# Patient Record
Sex: Female | Born: 1995 | Race: White | Hispanic: No | Marital: Married | Smoking: Never smoker
Health system: Southern US, Community
[De-identification: ages and names within clinical notes are randomized; demographics above are authoritative.]

## PROBLEM LIST (undated history)

## (undated) ENCOUNTER — Inpatient Hospital Stay (HOSPITAL_COMMUNITY): Payer: Self-pay

## (undated) DIAGNOSIS — F419 Anxiety disorder, unspecified: Secondary | ICD-10-CM

## (undated) DIAGNOSIS — N809 Endometriosis, unspecified: Secondary | ICD-10-CM

## (undated) DIAGNOSIS — IMO0001 Reserved for inherently not codable concepts without codable children: Secondary | ICD-10-CM

## (undated) DIAGNOSIS — Z789 Other specified health status: Secondary | ICD-10-CM

## (undated) DIAGNOSIS — D649 Anemia, unspecified: Secondary | ICD-10-CM

## (undated) DIAGNOSIS — F909 Attention-deficit hyperactivity disorder, unspecified type: Secondary | ICD-10-CM

## (undated) DIAGNOSIS — R519 Headache, unspecified: Secondary | ICD-10-CM

## (undated) HISTORY — DX: Anemia, unspecified: D64.9

## (undated) HISTORY — DX: Endometriosis, unspecified: N80.9

## (undated) HISTORY — PX: ENDOMETRIAL ABLATION: SHX621

## (undated) HISTORY — DX: Anxiety disorder, unspecified: F41.9

## (undated) HISTORY — PX: NO PAST SURGERIES: SHX2092

---
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
## 2015-08-14 LAB — OB RESULTS CONSOLE RUBELLA ANTIBODY, IGM: Rubella: IMMUNE

## 2015-08-14 LAB — OB RESULTS CONSOLE RPR: RPR: NONREACTIVE

## 2015-08-14 LAB — OB RESULTS CONSOLE HIV ANTIBODY (ROUTINE TESTING): HIV: NONREACTIVE

## 2015-08-14 LAB — OB RESULTS CONSOLE HEPATITIS B SURFACE ANTIGEN: Hepatitis B Surface Ag: NEGATIVE

## 2015-08-14 LAB — OB RESULTS CONSOLE GC/CHLAMYDIA
Chlamydia: NEGATIVE
GC PROBE AMP, GENITAL: NEGATIVE

## 2015-09-27 HISTORY — PX: WISDOM TOOTH EXTRACTION: SHX21

## 2015-09-27 NOTE — L&D Delivery Note (Addendum)
Final Labor Progress Note At 1020 pt reports an increased in rectal pressure.  VE C/C/+1.  FHR remained reassuring with occasional early and variable decelerations.  Vaginal Delivery Note The pt utilized an epidural as pain management.   Artificial rupture of membranes today, at 0255, clear.  GBS was negative.  Cervical dilation was complete at  1020.     Pushing with guidance began at  1114.   After 1 hour(s) and  32 minutes of pushing the head, shoulders and the body of a viable female infant " Veronica Cooke " delivered spontaneously with maternal effort in the ROA position at 1246.  With vigorous tone and spontaneous cry, the infant was placed on moms abd. After the umbilical cord was clamped it was cut by the FOB, then cord blood was obtained for evaluation.  Spontaneous delivery of a intact placenta with a 3 vessel cord via [place] at 1252 .   Episiotomy: None   The vulva, perineum, vaginal vault, rectum and cervix were inspectedand revealed a 2nd degree perineal, repaired using a 3-0 vicryl on a CT needle,  a superficial left labial laceration and a right right periurethral which was repaired using a 3-0 vicryl on a SH eedle with 5cc of 1% lidocaine.  The rectum sphincter intact after the repair.   Patient tolerated repair well.   Postpartum pitocin as ordered.  After delivery Fundus firm, lochia minimum, bleeding under control. However, after vaginal repair the uterus was noted to minimally boggy  And slow to firm up despite fundal massage. 1000 mcg of Cytotec given PR.   EBL 250, Pt hemodynamically stable.   Sponge, laps and needle count correct and verified with the primary care nurse.  Attending MD available at all times.    Routine postpartum orders   Mother unsure about method of contraception  Mom plans to breastfeed. Infant to have out patient circumcision   Placenta to pathology: YES     Cord Gases sent to lab: NO Cord blood sent to lab: YES   APGARS: 8 at 1 minute and 9  at 5 minutes Weight:.    Both mom and baby were left in stable condition, baby skin to skin.    Veronica Cooke, CNM, MSN 02/13/2016. 1:57 PM

## 2015-11-02 ENCOUNTER — Encounter (HOSPITAL_COMMUNITY): Payer: Self-pay | Admitting: *Deleted

## 2015-11-02 ENCOUNTER — Inpatient Hospital Stay (HOSPITAL_COMMUNITY)
Admission: AD | Admit: 2015-11-02 | Discharge: 2015-11-02 | Disposition: A | Discharge status: Home / Self Care | Payer: Medicaid Other | Source: Ambulatory Visit | Attending: Obstetrics & Gynecology | Admitting: Obstetrics & Gynecology

## 2015-11-02 DIAGNOSIS — R112 Nausea with vomiting, unspecified: Secondary | ICD-10-CM | POA: Insufficient documentation

## 2015-11-02 DIAGNOSIS — E860 Dehydration: Secondary | ICD-10-CM | POA: Diagnosis not present

## 2015-11-02 DIAGNOSIS — O26892 Other specified pregnancy related conditions, second trimester: Secondary | ICD-10-CM | POA: Diagnosis not present

## 2015-11-02 DIAGNOSIS — Z3A24 24 weeks gestation of pregnancy: Secondary | ICD-10-CM | POA: Insufficient documentation

## 2015-11-02 HISTORY — DX: Other specified health status: Z78.9

## 2015-11-02 LAB — URINALYSIS, ROUTINE W REFLEX MICROSCOPIC
GLUCOSE, UA: NEGATIVE mg/dL
KETONES UR: 15 mg/dL — AB
NITRITE: NEGATIVE
Protein, ur: 30 mg/dL — AB
Specific Gravity, Urine: >1.03 — ABNORMAL HIGH (ref 1.005–1.030)
pH: 6 (ref 5.0–8.0)

## 2015-11-02 LAB — COMPREHENSIVE METABOLIC PANEL
ALT: 17 U/L (ref 14–54)
AST: 23 U/L (ref 15–41)
Albumin: 3.7 g/dL (ref 3.5–5.0)
Alkaline Phosphatase: 65 U/L (ref 38–126)
Anion gap: 9 (ref 5–15)
BUN: 11 mg/dL (ref 6–20)
CO2: 23 mmol/L (ref 22–32)
Calcium: 8.8 mg/dL — ABNORMAL LOW (ref 8.9–10.3)
Chloride: 104 mmol/L (ref 101–111)
Creatinine, Ser: 0.45 mg/dL (ref 0.44–1.00)
Glucose, Bld: 99 mg/dL (ref 65–99)
Potassium: 3.9 mmol/L (ref 3.5–5.1)
SODIUM: 136 mmol/L (ref 135–145)
Total Bilirubin: 0.8 mg/dL (ref 0.3–1.2)
Total Protein: 7.3 g/dL (ref 6.5–8.1)

## 2015-11-02 LAB — URINE MICROSCOPIC-ADD ON

## 2015-11-02 LAB — CBC
HEMATOCRIT: 36 % (ref 36.0–46.0)
Hemoglobin: 12.1 g/dL (ref 12.0–15.0)
MCH: 28.2 pg (ref 26.0–34.0)
MCHC: 33.6 g/dL (ref 30.0–36.0)
MCV: 83.9 fL (ref 78.0–100.0)
Platelets: 306 10*3/uL (ref 150–400)
RBC: 4.29 MIL/uL (ref 3.87–5.11)
RDW: 13.9 % (ref 11.5–15.5)
WBC: 11.4 10*3/uL — AB (ref 4.0–10.5)

## 2015-11-02 MED ORDER — SODIUM CHLORIDE 0.9 % IV SOLN
8.0000 mg | Freq: Once | INTRAVENOUS | Status: AC
  Administered 2015-11-02: 8 mg via INTRAVENOUS
  Filled 2015-11-02: qty 4

## 2015-11-02 MED ORDER — DEXTROSE IN LACTATED RINGERS 5 % IV SOLN
INTRAVENOUS | Status: AC
  Administered 2015-11-02: 16:00:00 via INTRAVENOUS

## 2015-11-02 MED ORDER — ONDANSETRON 4 MG PO TBDP: 4.0000 mg | ORAL_TABLET | Freq: Three times a day (TID) | ORAL | Status: DC | PRN

## 2015-11-02 NOTE — MAU Provider Note (Signed)
History    Veronica Cooke, 19yo, G1P0, 24.3 wks  Presents to the MAU announced for nausea and vomiting.  Reports not keepin anything down for at least 3 days but is unsure of when the last time she was able to keep anything down. Pt reports struggling with N/V throughout entire pregnancy.  Also reports decreased fetal movement. Denies pain,vaginal bleeding.      Patient Active Problem List   Diagnosis Date Noted  . Need for prophylactic vaccination and inoculation against influenza 07/30/2013  . Family history of breast cancer 07/30/2013  . Cellulitis 07/30/2013    Chief Complaint  Patient presents with  . Hyperemesis Gravidarum   HPI  OB History    Gravida Para Term Preterm AB TAB SAB Ectopic Multiple Living   1               Past Medical History  Diagnosis Date  . Medical history non-contributory     Past Surgical History  Procedure Laterality Date  . No past surgeries      Family History  Problem Relation Age of Onset  . Diabetes Maternal Aunt   . Cancer Maternal Grandmother     Breast/ Ovarian  . Stroke Maternal Grandfather   . Diabetes Maternal Grandfather     Social History  Substance Use Topics  . Smoking status: Never Smoker   . Smokeless tobacco: Never Used  . Alcohol Use: No    Allergies: No Known Allergies  Prescriptions prior to admission  Medication Sig Dispense Refill Last Dose  . metoCLOPramide (REGLAN) 10 MG tablet Take 10 mg by mouth every 6 (six) hours as needed for nausea.   11/02/2015 at Unknown time    ROS Physical Exam   Blood pressure 111/64, pulse 88, temperature 97.8 F (36.6 C), resp. rate 16, height  (1.626 m), weight 61.508 kg (135 lb 9.6 oz), SpO2 99 %.  Results for orders placed or performed during the hospital encounter of 11/02/15 (from the past 24 hour(s))  Comprehensive metabolic panel     Status: Abnormal   Collection Time: 11/02/15  3:12 PM  Result Value Ref Range   Sodium 136 135 - 145 mmol/L   Potassium  3.9 3.5 - 5.1 mmol/L   Chloride 104 101 - 111 mmol/L   CO2 23 22 - 32 mmol/L   Glucose, Bld 99 65 - 99 mg/dL   BUN 11 6 - 20 mg/dL   Creatinine, Ser 1.61 0.44 - 1.00 mg/dL   Calcium 8.8 (L) 8.9 - 10.3 mg/dL   Total Protein 7.3 6.5 - 8.1 g/dL   Albumin 3.7 3.5 - 5.0 g/dL   AST 23 15 - 41 U/L   ALT 17 14 - 54 U/L   Alkaline Phosphatase 65 38 - 126 U/L   Total Bilirubin 0.8 0.3 - 1.2 mg/dL   GFR calc non Af Amer >60 >60 mL/min   GFR calc Af Amer >60 >60 mL/min   Anion gap 9 5 - 15  CBC     Status: Abnormal   Collection Time: 11/02/15  3:12 PM  Result Value Ref Range   WBC 11.4 (H) 4.0 - 10.5 K/uL   RBC 4.29 3.87 - 5.11 MIL/uL   Hemoglobin 12.1 12.0 - 15.0 g/dL   HCT 09.6 04.5 - 40.9 %   MCV 83.9 78.0 - 100.0 fL   MCH 28.2 26.0 - 34.0 pg   MCHC 33.6 30.0 - 36.0 g/dL   RDW 81.1 91.4 - 78.2 %  Platelets 306 150 - 400 K/uL  Urinalysis, Routine w reflex microscopic (not at [facility])     Status: Abnormal   Collection Time: 11/02/15  3:30 PM  Result Value Ref Range   Color, Urine AMBER (A) YELLOW   APPearance CLOUDY (A) CLEAR   Specific Gravity, Urine >1.030 (H) 1.005 - 1.030   pH 6.0 5.0 - 8.0   Glucose, UA NEGATIVE NEGATIVE mg/dL   Hgb urine dipstick TRACE (A) NEGATIVE   Bilirubin Urine SMALL (A) NEGATIVE   Ketones, ur 15 (A) NEGATIVE mg/dL   Protein, ur 30 (A) NEGATIVE mg/dL   Nitrite NEGATIVE NEGATIVE   Leukocytes, UA MODERATE (A) NEGATIVE  Urine microscopic-add on     Status: Abnormal   Collection Time: 11/02/15  3:30 PM  Result Value Ref Range   Squamous Epithelial / LPF 6-30 (A) NONE SEEN   WBC, UA 6-30 0 - 5 WBC/hpf   RBC / HPF 0-5 0 - 5 RBC/hpf   Bacteria, UA MANY (A) NONE SEEN   Urine-Other MUCOUS PRESENT    SVE: deferred FHT:   Reassuring, BL 150 bpm, appropriate for [redacted] wk ega UC: None  Physical Exam  Constitutional: She is oriented to person, place, and time. She appears well-developed and well-nourished.  HENT:  Head: Normocephalic.  Eyes: Pupils are equal,  round, and reactive to light.  Neck: Normal range of motion.  Cardiovascular: Normal rate and regular rhythm.   Respiratory: Effort normal.  GI: Soft.  Musculoskeletal: Normal range of motion.  Neurological: She is alert and oriented to person, place, and time. She has normal reflexes.  Skin: Skin is warm and dry.  Psychiatric: She has a normal mood and affect. Her behavior is normal. Judgment and thought content normal.    ED Course  Assessment: IUP 24.3 wks N/V of pregnancy Dehydration, 15 ketones FHT reassuring No Contractions  Plan: DC home IV Fluids IV Zofran  RX Zofran  OTC  Keep scheduled appointment at [facility] Call PRN   Alphonzo Severance CNM, MSN 11/02/2015 6:03 PM

## 2015-11-02 NOTE — MAU Note (Signed)
Patient states she has had n/v throughout pregnancy and it has gotten worse.  She is unsure of when the last time she was able to keep anything down.  No c/o pain, no vaginal bleeding. Reports  Decreased fetal movement.

## 2015-11-02 NOTE — Discharge Instructions (Signed)
Hyperemesis Gravidarum °Hyperemesis gravidarum is a severe form of nausea and vomiting that happens during pregnancy. Hyperemesis is worse than morning sickness. It may cause you to have nausea or vomiting all day for many days. It may keep you from eating and drinking enough food and liquids. Hyperemesis usually occurs during the first half (the first 20 weeks) of pregnancy. It often goes away once a woman is in her second half of pregnancy. However, sometimes hyperemesis continues through an entire pregnancy.  °CAUSES  °The cause of this condition is not completely known but is thought to be related to changes in the body's hormones when pregnant. It could be from the high level of the pregnancy hormone or an increase in estrogen in the body.  °SIGNS AND SYMPTOMS  °· Severe nausea and vomiting. °· Nausea that does not go away. °· Vomiting that does not allow you to keep any food down. °· Weight loss and body fluid loss (dehydration). °· Having no desire to eat or not liking food you have previously enjoyed. °DIAGNOSIS  °Your health care provider will do a physical exam and ask you about your symptoms. He or she may also order blood tests and urine tests to make sure something else is not causing the problem.  °TREATMENT  °You may only need medicine to control the problem. If medicines do not control the nausea and vomiting, you will be treated in the hospital to prevent dehydration, increased acid in the blood (acidosis), weight loss, and changes in the electrolytes in your body that may harm the unborn baby (fetus). You may need IV fluids.  °HOME CARE INSTRUCTIONS  °· Only take over-the-counter or prescription medicines as directed by your health care provider. °· Try eating a couple of dry crackers or toast in the morning before getting out of bed. °· Avoid foods and smells that upset your stomach. °· Avoid fatty and spicy foods. °· Eat 5-6 small meals a day. °· Do not drink when eating meals. Drink between  meals. °· For snacks, eat high-protein foods, such as cheese. °· Eat or suck on things that have ginger in them. Ginger helps nausea. °· Avoid food preparation. The smell of food can spoil your appetite. °· Avoid iron pills and iron in your multivitamins until after 3-4 months of being pregnant. However, consult with your health care provider before stopping any prescribed iron pills. °SEEK MEDICAL CARE IF:  °· Your abdominal pain increases. °· You have a severe headache. °· You have vision problems. °· You are losing weight. °SEEK IMMEDIATE MEDICAL CARE IF:  °· You are unable to keep fluids down. °· You vomit blood. °· You have constant nausea and vomiting. °· You have excessive weakness. °· You have extreme thirst. °· You have dizziness or fainting. °· You have a fever or persistent symptoms for more than 2-3 days. °· You have a fever and your symptoms suddenly get worse. °MAKE SURE YOU:  °· Understand these instructions. °· Will watch your condition. °· Will get help right away if you are not doing well or get worse. °  °This information is not intended to replace advice given to you by your health care provider. Make sure you discuss any questions you have with your health care provider. °  °Document Released: 09/12/2005 Document Revised: 07/03/2013 Document Reviewed: 04/24/2013 °Elsevier Interactive Patient Education ©2016 [employer] ° °Eating Plan for Hyperemesis Gravidarum °Severe cases of hyperemesis gravidarum can lead to dehydration and malnutrition. The hyperemesis eating plan is   one way to lessen the symptoms of nausea and vomiting. It is often used with prescribed medicines to control your symptoms.  °WHAT CAN I DO TO RELIEVE MY SYMPTOMS? °Listen to your body. Everyone is different and has different preferences. Find what works best for you. Some of the following things may help: °· Eat and drink slowly. °· Eat 5-6 small meals daily instead of 3 large meals.   °· Eat crackers before you get out of  bed in the morning.   °· Starchy foods are usually well tolerated (such as cereal, toast, bread, potatoes, pasta, rice, and pretzels).   °· Ginger may help with nausea. Add ¼ tsp ground ginger to hot tea or choose ginger tea.   °· Try drinking 100% fruit juice or an electrolyte drink. °· Continue to take your prenatal vitamins as directed by your health care provider. If you are having trouble taking your prenatal vitamins, talk with your health care provider about different options. °· Include at least 1 serving of protein with your meals and snacks (such as meats or poultry, beans, nuts, eggs, or yogurt). Try eating a protein-rich snack before bed (such as cheese and crackers or a half turkey or peanut butter sandwich). °WHAT THINGS SHOULD I AVOID TO REDUCE MY SYMPTOMS? °The following things may help reduce your symptoms: °· Avoid foods with strong smells. Try eating meals in well-ventilated areas that are free of odors. °· Avoid drinking water or other beverages with meals. Try not to drink anything less than 30 minutes before and after meals. °· Avoid drinking more than 1 cup of fluid at a time. °· Avoid fried or high-fat foods, such as butter and cream sauces. °· Avoid spicy foods. °· Avoid skipping meals the best you can. Nausea can be more intense on an empty stomach. If you cannot tolerate food at that time, do not force it. Try sucking on ice chips or other frozen items and make up the calories later. °· Avoid lying down within 2 hours after eating. °  °This information is not intended to replace advice given to you by your health care provider. Make sure you discuss any questions you have with your health care provider. °  °Document Released: 07/10/2007 Document Revised: 09/17/2013 Document Reviewed: 07/17/2013 °Elsevier Interactive Patient Education ©2016 Elsevier Inc. ° ° °

## 2015-11-28 ENCOUNTER — Encounter (HOSPITAL_COMMUNITY): Payer: Self-pay | Admitting: *Deleted

## 2015-11-28 ENCOUNTER — Inpatient Hospital Stay (HOSPITAL_COMMUNITY)
Admission: AD | Admit: 2015-11-28 | Discharge: 2015-11-28 | Disposition: A | Discharge status: Home / Self Care | Payer: Medicaid Other | Source: Ambulatory Visit | Attending: Obstetrics and Gynecology | Admitting: Obstetrics and Gynecology

## 2015-11-28 ENCOUNTER — Inpatient Hospital Stay (HOSPITAL_COMMUNITY): Payer: Medicaid Other

## 2015-11-28 DIAGNOSIS — O26893 Other specified pregnancy related conditions, third trimester: Secondary | ICD-10-CM | POA: Diagnosis not present

## 2015-11-28 DIAGNOSIS — R109 Unspecified abdominal pain: Secondary | ICD-10-CM | POA: Diagnosis present

## 2015-11-28 DIAGNOSIS — Z3A28 28 weeks gestation of pregnancy: Secondary | ICD-10-CM | POA: Diagnosis not present

## 2015-11-28 DIAGNOSIS — O26899 Other specified pregnancy related conditions, unspecified trimester: Secondary | ICD-10-CM

## 2015-11-28 DIAGNOSIS — O9A212 Injury, poisoning and certain other consequences of external causes complicating pregnancy, second trimester: Secondary | ICD-10-CM

## 2015-11-28 LAB — CBC
HEMATOCRIT: 33.9 % — AB (ref 36.0–46.0)
HEMOGLOBIN: 11.4 g/dL — AB (ref 12.0–15.0)
MCH: 27.1 pg (ref 26.0–34.0)
MCHC: 33.6 g/dL (ref 30.0–36.0)
MCV: 80.7 fL (ref 78.0–100.0)
Platelets: 325 10*3/uL (ref 150–400)
RBC: 4.2 MIL/uL (ref 3.87–5.11)
RDW: 13.5 % (ref 11.5–15.5)
WBC: 11.9 10*3/uL — AB (ref 4.0–10.5)

## 2015-11-28 LAB — KLEIHAUER-BETKE STAIN
# VIALS RHIG: 1
Fetal Cells %: 0 %
Quantitation Fetal Hemoglobin: 0 mL

## 2015-11-28 LAB — URINE MICROSCOPIC-ADD ON: RBC / HPF: NONE SEEN RBC/hpf (ref 0–5)

## 2015-11-28 LAB — WET PREP, GENITAL
Clue Cells Wet Prep HPF POC: NONE SEEN
SPERM: NONE SEEN
TRICH WET PREP: NONE SEEN
Yeast Wet Prep HPF POC: NONE SEEN

## 2015-11-28 LAB — URINALYSIS, ROUTINE W REFLEX MICROSCOPIC
Bilirubin Urine: NEGATIVE
GLUCOSE, UA: NEGATIVE mg/dL
Hgb urine dipstick: NEGATIVE
Ketones, ur: NEGATIVE mg/dL
Nitrite: NEGATIVE
PH: 5.5 (ref 5.0–8.0)
Protein, ur: NEGATIVE mg/dL
Specific Gravity, Urine: >1.03 — ABNORMAL HIGH (ref 1.005–1.030)

## 2015-11-28 LAB — FETAL FIBRONECTIN: FETAL FIBRONECTIN: NEGATIVE

## 2015-11-28 LAB — TYPE AND SCREEN
ABO/RH(D): O POS
ANTIBODY SCREEN: NEGATIVE

## 2015-11-28 LAB — ABO/RH: ABO/RH(D): O POS

## 2015-11-28 IMAGING — US US MFM OB LIMITED
1 series · 15 of 16 positions shown · non-contrast
Comparison: none

[Series 1: us mfm ob limited · 16 acquisitions, 15 frames shown]
[im 1/16]
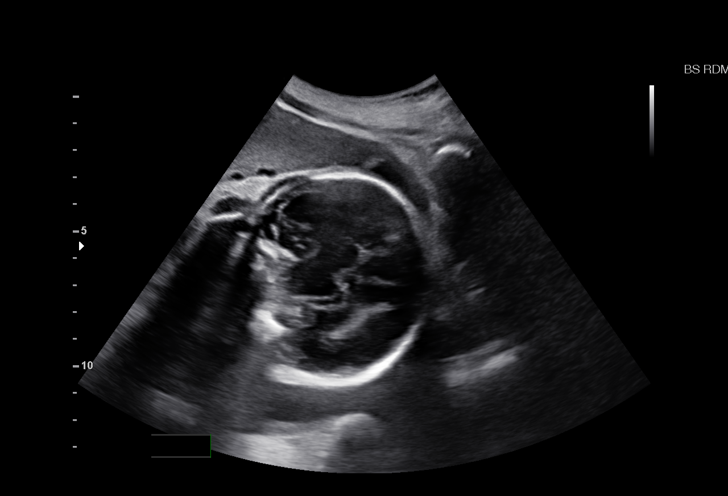
[im 2/16]
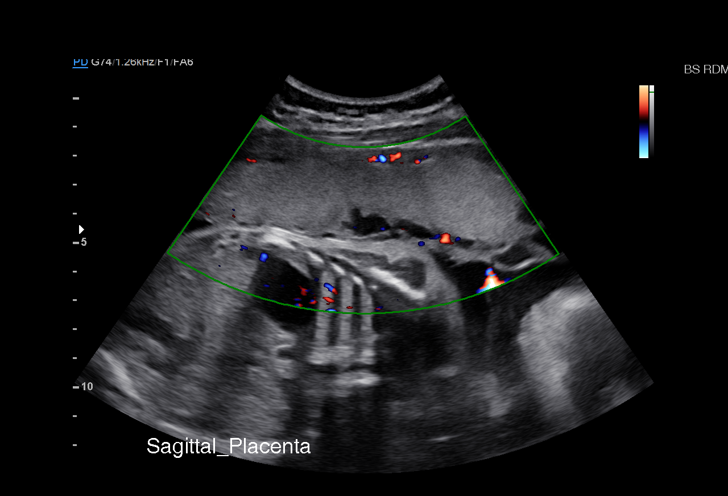
[im 3/16]
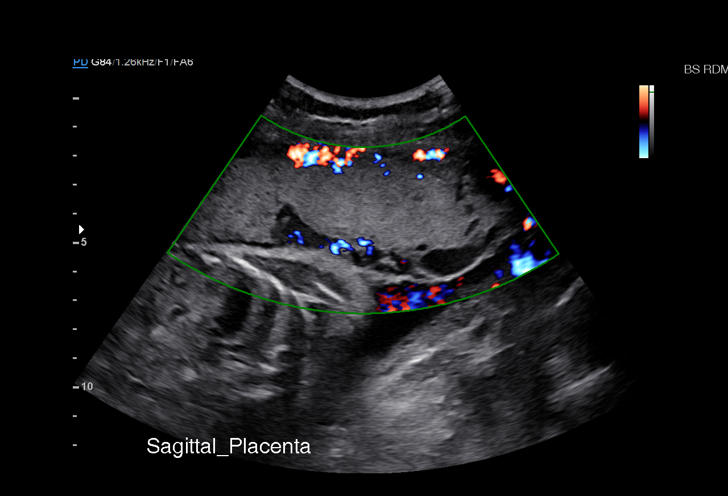
[im 4/16]
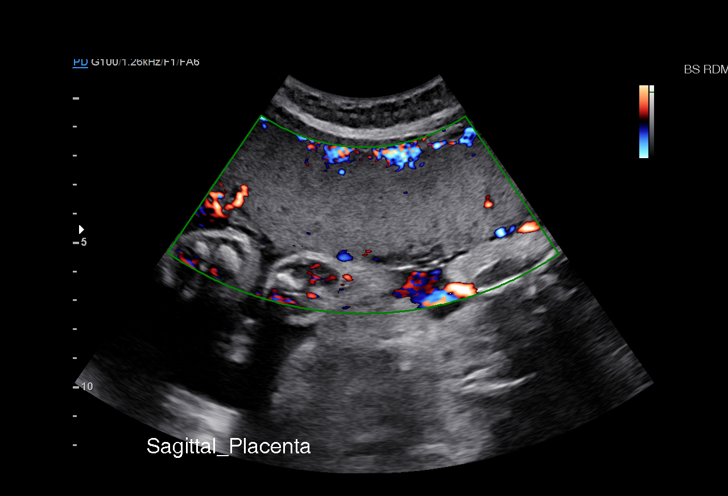
[im 5/16]
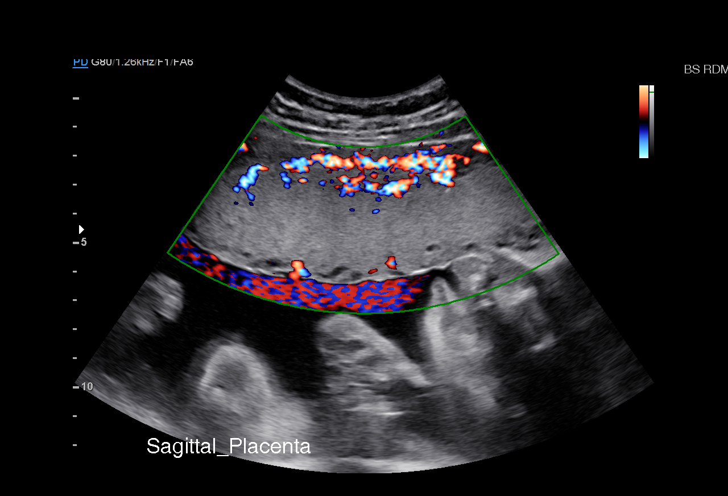
[im 6/16]
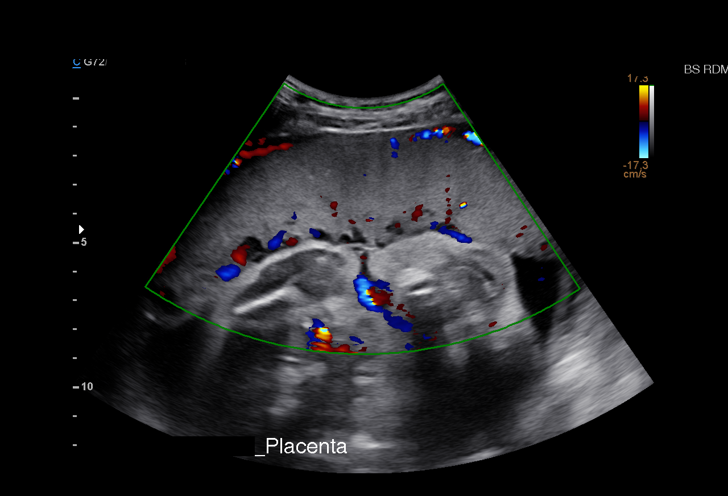
[im 7/16]
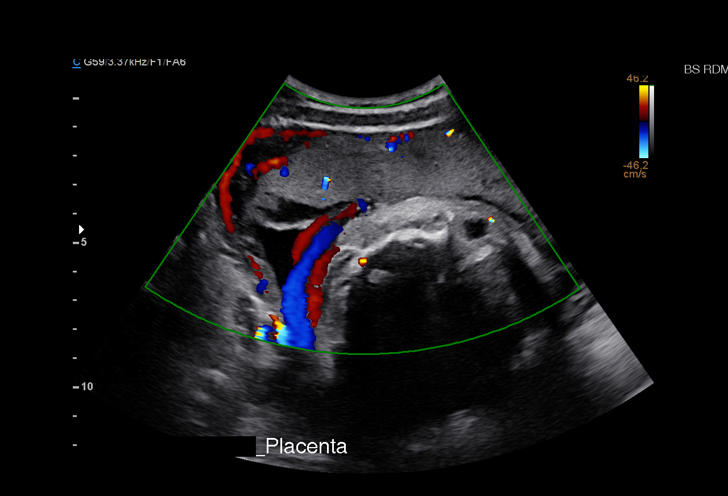
[im 9/16]
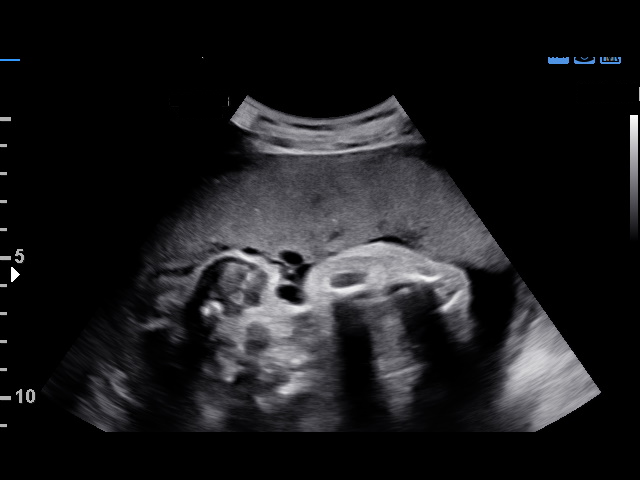
[im 10/16]
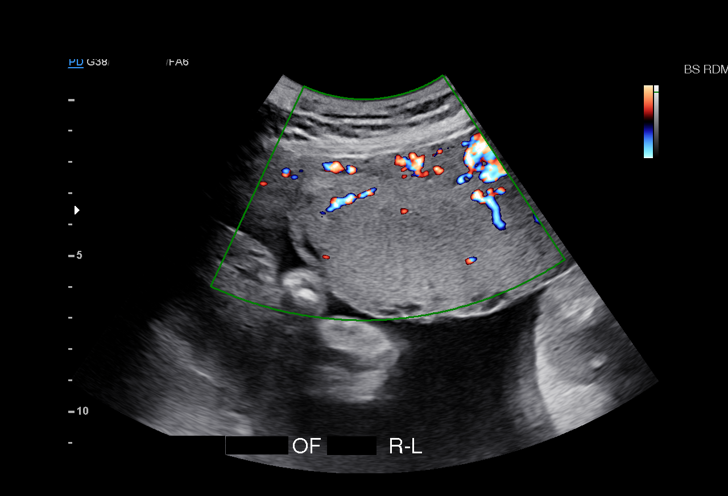
[im 11/16]
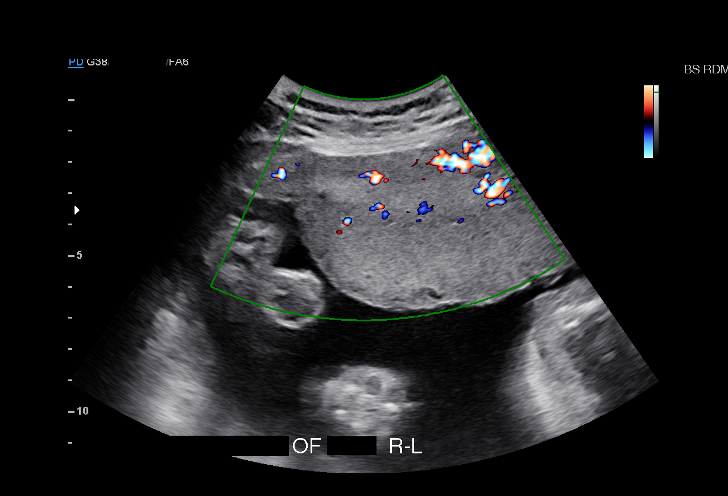
[im 12/16]
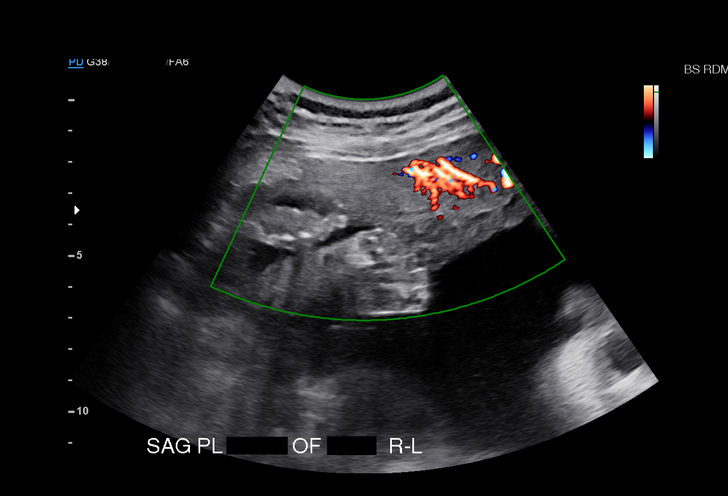
[im 13/16]
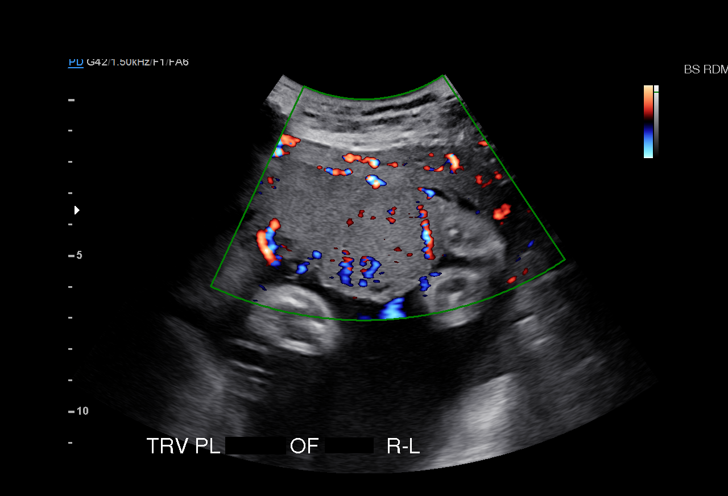
[im 14/16]
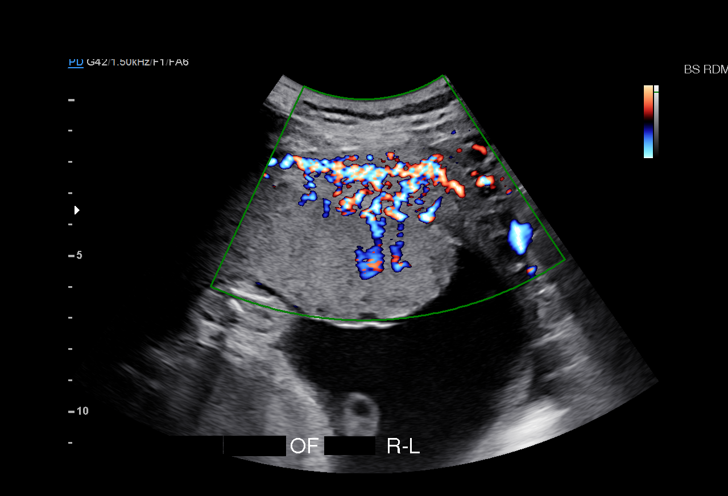
[im 15/16]
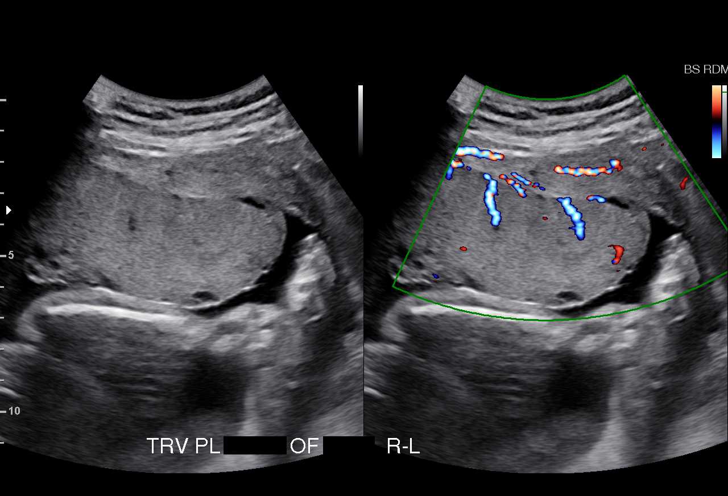
[im 16/16]
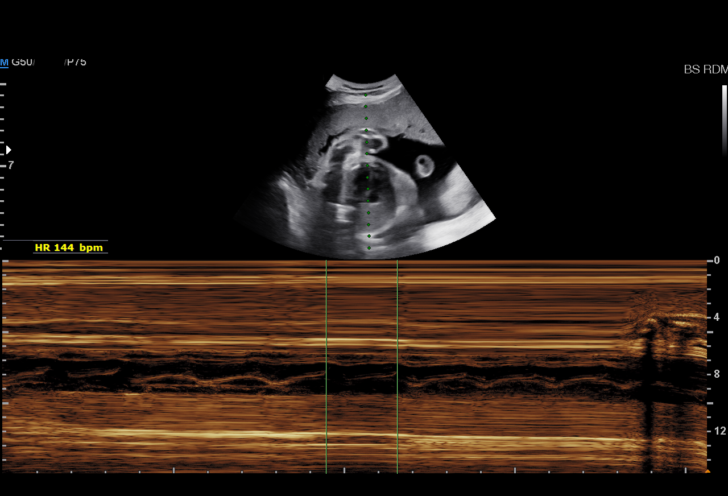

[15 of 16 positions shown; findings below may reference images not displayed]

MAU/Triage
STANDARD CNM

1  JHA JHA           [PHONE_NUMBER]      [PHONE_NUMBER]     [PHONE_NUMBER]
Indications

Traumatic injury during pregnancy              O9A.219 [UO]
Abdominal pain in pregnancy                    [UO]
28 weeks gestation of pregnancy
Decreased fetal movements, third trimester,    [UO]
unspecified
OB History

Gravidity:    1         Term:   0        Prem:   0         SAB:   0
TOP:          0       Ectopic:  0        Living: 0
Fetal Evaluation

Num Of Fetuses:     1
Fetal Heart         144
Rate(bpm):
Cardiac Activity:   Observed
Presentation:       Cephalic
Placenta:           Anterior, above cervical os

Amniotic Fluid
AFI FV:      Subjectively within normal limits
AFI Sum:     15.56    cm      55  %Tile      Larg Pckt:   6.87  cm
RUQ:   2.25    cm   RLQ:    1.76   cm    LUQ:   6.87    cm    LLQ:   4.68    cm
Gestational Age

LMP:           28w 1d        Date:  [DATE]                 EDD:    [DATE]
Best:          28w 1d     Det. By:  LMP  ([DATE])          EDD:    [DATE]
Cervix Uterus Adnexa

Cervix
Not adaquately visualized
Impression

SIUP at 28+1 weeks
Normal amniotic fluid volume
Anterior placenta; no previa; no subchorionic fluid
collections/hemorrhage identified
Recommendations

Follow-up as clinically indicated

## 2015-11-28 MED ORDER — LACTATED RINGERS IV BOLUS (SEPSIS)
1000.0000 mL | Freq: Once | INTRAVENOUS | Status: AC
  Administered 2015-11-28: 1000 mL via INTRAVENOUS

## 2015-11-28 MED ORDER — NIFEDIPINE 10 MG PO CAPS
10.0000 mg | ORAL_CAPSULE | ORAL | Status: DC | PRN
  Administered 2015-11-28: 10 mg via ORAL
  Filled 2015-11-28: qty 1

## 2015-11-28 MED ORDER — NIFEDIPINE 10 MG PO CAPS: 10.0000 mg | ORAL_CAPSULE | Freq: Three times a day (TID) | ORAL | Status: DC | PRN

## 2015-11-28 MED ORDER — NIFEDIPINE 10 MG PO CAPS
10.0000 mg | ORAL_CAPSULE | Freq: Once | ORAL | Status: AC
  Administered 2015-11-28: 10 mg via ORAL
  Filled 2015-11-28: qty 1

## 2015-11-28 MED ORDER — BUTORPHANOL TARTRATE 1 MG/ML IJ SOLN
1.0000 mg | Freq: Once | INTRAMUSCULAR | Status: AC
  Administered 2015-11-28: 1 mg via INTRAVENOUS
  Filled 2015-11-28: qty 1

## 2015-11-28 MED ORDER — CEPHALEXIN 500 MG PO CAPS: 500.0000 mg | ORAL_CAPSULE | Freq: Two times a day (BID) | ORAL | Status: AC

## 2015-11-28 MED ORDER — NIFEDIPINE 10 MG PO CAPS
10.0000 mg | ORAL_CAPSULE | ORAL | Status: AC | PRN
  Administered 2015-11-28 (×2): 10 mg via ORAL
  Filled 2015-11-28 (×2): qty 1

## 2015-11-28 NOTE — MAU Provider Note (Signed)
History    Veronica Cooke is a 20y.o. G1P0 at 28.1wks who presents, unannounced, for increased abdominal pain.  Patient states she attempted to pick up rx at pharmacy and it "was closed early."  Patient states she called hospital requesting rx be sent to another pharmacy and was told to come in.  Patient endorses fetal movement and denies VB and LoF.  Patient endorses improvement in abdominal pain with initiation of oral hydration.  Patient denies other pain.    Patient Active Problem List   Diagnosis Date Noted  . Need for prophylactic vaccination and inoculation against influenza 07/30/2013  . Family history of breast cancer 07/30/2013  . Cellulitis 07/30/2013    Chief Complaint  Patient presents with  . Contractions   HPI  OB History    Gravida Para Term Preterm AB TAB SAB Ectopic Multiple Living   1               Past Medical History  Diagnosis Date  . Medical history non-contributory     Past Surgical History  Procedure Laterality Date  . No past surgeries      Family History  Problem Relation Age of Onset  . Diabetes Maternal Aunt   . Cancer Maternal Grandmother     Breast/ Ovarian  . Stroke Maternal Grandfather   . Diabetes Maternal Grandfather     Social History  Substance Use Topics  . Smoking status: Never Smoker   . Smokeless tobacco: Never Used  . Alcohol Use: No    Allergies: No Known Allergies  Prescriptions prior to admission  Medication Sig Dispense Refill Last Dose  . acetaminophen (TYLENOL) 500 MG tablet Take 1,000 mg by mouth every 6 (six) hours as needed for headache.   11/27/2015 at Unknown time  . cephALEXin (KEFLEX) 500 MG capsule Take 1 capsule (500 mg total) by mouth 2 (two) times daily. 14 capsule 0   . docusate sodium (COLACE) 100 MG capsule Take 100 mg by mouth 2 (two) times daily.   11/28/2015 at Unknown time  . NIFEdipine (PROCARDIA) 10 MG capsule Take 1 capsule (10 mg total) by mouth 3 (three) times daily as needed (q6 prn). 30 capsule  1   . ondansetron (ZOFRAN ODT) 4 MG disintegrating tablet Take 1 tablet (4 mg total) by mouth every 8 (eight) hours as needed for nausea or vomiting. 60 tablet 1 11/28/2015 at Unknown time    ROS  See HPI Above Physical Exam   Blood pressure 114/70, pulse 96, temperature 98.1 F (36.7 C), resp. rate 18.  Results for orders placed or performed during the hospital encounter of 11/28/15 (from the past 24 hour(s))  Urinalysis, Routine w reflex microscopic ([facility])     Status: Abnormal   Collection Time: 11/28/15  2:22 PM  Result Value Ref Range   Color, Urine YELLOW YELLOW   APPearance CLEAR CLEAR   Specific Gravity, Urine >1.030 (H) 1.005 - 1.030   pH 5.5 5.0 - 8.0   Glucose, UA NEGATIVE NEGATIVE mg/dL   Hgb urine dipstick NEGATIVE NEGATIVE   Bilirubin Urine NEGATIVE NEGATIVE   Ketones, ur NEGATIVE NEGATIVE mg/dL   Protein, ur NEGATIVE NEGATIVE mg/dL   Nitrite NEGATIVE NEGATIVE   Leukocytes, UA TRACE (A) NEGATIVE  Urine microscopic-add on     Status: Abnormal   Collection Time: 11/28/15  2:22 PM  Result Value Ref Range   Squamous Epithelial / LPF 0-5 (A) NONE SEEN   WBC, UA 0-5 0 - 5 WBC/hpf  RBC / HPF NONE SEEN 0 - 5 RBC/hpf   Bacteria, UA FEW (A) NONE SEEN   Urine-Other MUCOUS PRESENT   Kleihauer-Betke stain     Status: None   Collection Time: 11/28/15  2:50 PM  Result Value Ref Range   Fetal Cells % 0 %   Quantitation Fetal Hemoglobin 0 mL   # Vials RhIg 1   CBC     Status: Abnormal   Collection Time: 11/28/15  2:50 PM  Result Value Ref Range   WBC 11.9 (H) 4.0 - 10.5 K/uL   RBC 4.20 3.87 - 5.11 MIL/uL   Hemoglobin 11.4 (L) 12.0 - 15.0 g/dL   HCT 40.9 (L) 81.1 - 91.4 %   MCV 80.7 78.0 - 100.0 fL   MCH 27.1 26.0 - 34.0 pg   MCHC 33.6 30.0 - 36.0 g/dL   RDW 78.2 95.6 - 21.3 %   Platelets 325 150 - 400 K/uL  Type and screen [facility]     Status: None   Collection Time: 11/28/15  2:50 PM  Result Value Ref Range   ABO/RH(D) O POS    Antibody  Screen NEG    Sample Expiration 12/01/2015   Wet prep, genital     Status: Abnormal   Collection Time: 11/28/15  3:19 PM  Result Value Ref Range   Yeast Wet Prep HPF POC NONE SEEN NONE SEEN   Trich, Wet Prep NONE SEEN NONE SEEN   Clue Cells Wet Prep HPF POC NONE SEEN NONE SEEN   WBC, Wet Prep HPF POC MANY (A) NONE SEEN   Sperm NONE SEEN   Fetal fibronectin     Status: None   Collection Time: 11/28/15  3:19 PM  Result Value Ref Range   Fetal Fibronectin NEGATIVE NEGATIVE    Physical Exam  Vitals reviewed. Constitutional: She is oriented to person, place, and time. She appears well-developed and well-nourished.  HENT:  Head: Normocephalic and atraumatic.  Eyes: Conjunctivae are normal.  Neck: Normal range of motion.  Cardiovascular: Normal rate.   Respiratory: Effort normal.  Musculoskeletal: Normal range of motion.  Neurological: She is alert and oriented to person, place, and time.    FHR:135 bpm, Mod Var, -Decels, +Accels UC: Irritability Noted ED Course  Assessment: IUP at 28.1wks Cat I FT Abdominal Pain  Plan: -Push PO fluids -Procardia per [facility] protocol -Discussed who and when to call for pregnancy related concerns -Instructed to pick up rx in am and call provider throughout the night with any other issues -Will give at least one dose procardia and monitor as appropriate  Follow Up (2145) -Patient reports improvement in symptoms with hydration and procardia dosing x 1 -Pick up prescriptions in morning and take procardia as needed -Encouraged to try oral hydration prior to taking procardia -Encouraged to rest throughout the weekend -Encouraged to call if any questions or concerns arise prior to next scheduled office visit.  -Keep appt as scheduled: 12/10/2015 -Discharged to home in improved condition  Cherre Robins CNM, MSN 11/28/2015 8:51 PM

## 2015-11-28 NOTE — Discharge Instructions (Signed)

## 2015-11-28 NOTE — MAU Note (Signed)
Pt reprots she was just discharged for ctx. Given Rx for them unable to pick up because pharmacy closed. Pt told to come back to MAU.

## 2015-11-28 NOTE — MAU Note (Signed)
Called US for stat bedside for possible placental abruption.

## 2015-11-28 NOTE — MAU Note (Signed)
Pushed into a window ledge on Tuesday, hit belly had mild pain in area to left of belly button.  Seen in office on Thursday.no concerns.  Today noticed baby boy has not been moving like normal and spot where belly hit has been worse and movement makes the pain worse.  No bleeding or leaking any fluid.  No intercourse for at least 2 months
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
## 2015-11-28 NOTE — Discharge Instructions (Signed)
Abdominal Pain, Adult °Many things can cause abdominal pain. Usually, abdominal pain is not caused by a disease and will improve without treatment. It can often be observed and treated at home. Your health care provider will do a physical exam and possibly order blood tests and X-rays to help determine the seriousness of your pain. However, in many cases, more time must pass before a clear cause of the pain can be found. Before that point, your health care provider may not know if you need more testing or further treatment. °HOME CARE INSTRUCTIONS °Monitor your abdominal pain for any changes. The following actions may help to alleviate any discomfort you are experiencing: °· Only take over-the-counter or prescription medicines as directed by your health care provider. °· Do not take laxatives unless directed to do so by your health care provider. °· Try a clear liquid diet (broth, tea, or water) as directed by your health care provider. Slowly move to a bland diet as tolerated. °SEEK MEDICAL CARE IF: °· You have unexplained abdominal pain. °· You have abdominal pain associated with nausea or diarrhea. °· You have pain when you urinate or have a bowel movement. °· You experience abdominal pain that wakes you in the night. °· You have abdominal pain that is worsened or improved by eating food. °· You have abdominal pain that is worsened with eating fatty foods. °· You have a fever. °SEEK IMMEDIATE MEDICAL CARE IF: °· Your pain does not go away within 2 hours. °· You keep throwing up (vomiting). °· Your pain is felt only in portions of the abdomen, such as the right side or the left lower portion of the abdomen. °· You pass bloody or black tarry stools. °MAKE SURE YOU: °· Understand these instructions. °· Will watch your condition. °· Will get help right away if you are not doing well or get worse. °  °This information is not intended to replace advice given to you by your health care provider. Make sure you discuss  any questions you have with your health care provider. °  °Document Released: 06/22/2005 Document Revised: 06/03/2015 Document Reviewed: 05/22/2013 °Elsevier Interactive Patient Education ©2016 Elsevier Inc. ° ° °Abdominal Pain, Adult °Many things can cause belly (abdominal) pain. Most times, the belly pain is not dangerous. Many cases of belly pain can be watched and treated at home. °HOME CARE  °· Do not take medicines that help you go poop (laxatives) unless told to by your doctor. °· Only take medicine as told by your doctor. °· Eat or drink as told by your doctor. Your doctor will tell you if you should be on a special diet. °GET HELP IF: °· You do not know what is causing your belly pain. °· You have belly pain while you are sick to your stomach (nauseous) or have runny poop (diarrhea). °· You have pain while you pee or poop. °· Your belly pain wakes you up at night. °· You have belly pain that gets worse or better when you eat. °· You have belly pain that gets worse when you eat fatty foods. °· You have a fever. °GET HELP RIGHT AWAY IF:  °· The pain does not go away within 2 hours. °· You keep throwing up (vomiting). °· The pain changes and is only in the right or left part of the belly. °· You have bloody or tarry looking poop. °MAKE SURE YOU:  °· Understand these instructions. °· Will watch your condition. °· Will get help right away   if you are not doing well or get worse. °  °This information is not intended to replace advice given to you by your health care provider. Make sure you discuss any questions you have with your health care provider. °  °Document Released: 02/29/2008 Document Revised: 10/03/2014 Document Reviewed: 05/22/2013 °Elsevier Interactive Patient Education ©2016 Elsevier Inc. ° °

## 2015-11-28 NOTE — Progress Notes (Signed)
Cumi, Child psychotherapistsocial worker notified will come and talk with patient
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
## 2015-11-30 LAB — CULTURE, OB URINE

## 2015-12-02 ENCOUNTER — Encounter (HOSPITAL_COMMUNITY): Payer: Self-pay | Admitting: *Deleted

## 2015-12-02 ENCOUNTER — Inpatient Hospital Stay (HOSPITAL_COMMUNITY)
Admission: AD | Admit: 2015-12-02 | Discharge: 2015-12-02 | Disposition: A | Discharge status: Home / Self Care | Payer: Medicaid Other | Source: Ambulatory Visit | Attending: Obstetrics and Gynecology | Admitting: Obstetrics and Gynecology

## 2015-12-02 DIAGNOSIS — Z3A28 28 weeks gestation of pregnancy: Secondary | ICD-10-CM | POA: Insufficient documentation

## 2015-12-02 DIAGNOSIS — Z803 Family history of malignant neoplasm of breast: Secondary | ICD-10-CM | POA: Diagnosis not present

## 2015-12-02 DIAGNOSIS — R109 Unspecified abdominal pain: Secondary | ICD-10-CM | POA: Diagnosis present

## 2015-12-02 DIAGNOSIS — O26893 Other specified pregnancy related conditions, third trimester: Secondary | ICD-10-CM | POA: Insufficient documentation

## 2015-12-02 LAB — WET PREP, GENITAL
Clue Cells Wet Prep HPF POC: NONE SEEN
Sperm: NONE SEEN
TRICH WET PREP: NONE SEEN
YEAST WET PREP: NONE SEEN

## 2015-12-02 LAB — FETAL FIBRONECTIN: FETAL FIBRONECTIN: NEGATIVE

## 2015-12-02 LAB — AMNISURE RUPTURE OF MEMBRANE (ROM) NOT AT ARMC: Amnisure ROM: NEGATIVE

## 2015-12-02 MED ORDER — IBUPROFEN 600 MG PO TABS
600.0000 mg | ORAL_TABLET | Freq: Four times a day (QID) | ORAL | Status: DC
  Administered 2015-12-02: 600 mg via ORAL
  Filled 2015-12-02: qty 1

## 2015-12-02 NOTE — MAU Note (Signed)
Sending urine to lab. 
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
## 2015-12-02 NOTE — Discharge Instructions (Signed)
INCREASE REST FOR THE NEXT 24 HOURS INCREASE FLUIDS FOR THE NEXT 24 HOURS TAKE IBUPROPHEN 600 MG (TAKE 3 200 MG TABLETS) EVERY 6 HOURS FOR 24 HOURS. CALL FOR ANY QUESTIONS OR CONCERNS.  Preterm Labor Information Preterm labor is when labor starts at less than 37 weeks of pregnancy. The normal length of a pregnancy is 39 to 41 weeks. CAUSES Often, there is no identifiable underlying cause as to why a woman goes into preterm labor. One of the most common known causes of preterm labor is infection. Infections of the uterus, cervix, vagina, amniotic sac, bladder, kidney, or even the lungs (pneumonia) can cause labor to start. Other suspected causes of preterm labor include:   Urogenital infections, such as yeast infections and bacterial vaginosis.   Uterine abnormalities (uterine shape, uterine septum, fibroids, or bleeding from the placenta).   A cervix that has been operated on (it may fail to stay closed).   Malformations in the fetus.   Multiple gestations (twins, triplets, and so on).   Breakage of the amniotic sac.  RISK FACTORS  Having a previous history of preterm labor.   Having premature rupture of membranes (PROM).   Having a placenta that covers the opening of the cervix (placenta previa).   Having a placenta that separates from the uterus (placental abruption).   Having a cervix that is too weak to hold the fetus in the uterus (incompetent cervix).   Having too much fluid in the amniotic sac (polyhydramnios).   Taking illegal drugs or smoking while pregnant.   Not gaining enough weight while pregnant.   Being younger than 118 and older than 21 years old.   Having a low socioeconomic status.   Being African American. SYMPTOMS Signs and symptoms of preterm labor include:   Menstrual-like cramps, abdominal pain, or back pain.  Uterine contractions that are regular, as frequent as six in an hour, regardless of their intensity (may be mild or  painful).  Contractions that start on the top of the uterus and spread down to the lower abdomen and back.   A sense of increased pelvic pressure.   A watery or bloody mucus discharge that comes from the vagina.  TREATMENT Depending on the length of the pregnancy and other circumstances, your health care provider may suggest bed rest. If necessary, there are medicines that can be given to stop contractions and to mature the fetal lungs. If labor happens before 34 weeks of pregnancy, a prolonged hospital stay may be recommended. Treatment depends on the condition of both you and the fetus.  WHAT SHOULD YOU DO IF YOU THINK YOU ARE IN PRETERM LABOR? Call your health care provider right away. You will need to go to the hospital to get checked immediately. HOW CAN YOU PREVENT PRETERM LABOR IN FUTURE PREGNANCIES? You should:   Stop smoking if you smoke.  Maintain healthy weight gain and avoid chemicals and drugs that are not necessary.  Be watchful for any type of infection.  Inform your health care provider if you have a known history of preterm labor.   This information is not intended to replace advice given to you by your health care provider. Make sure you discuss any questions you have with your health care provider.   Document Released: 12/03/2003 Document Revised: 05/15/2013 Document Reviewed: 10/15/2012 Elsevier Interactive Patient Education [employer]2016 Elsevier Inc.
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
## 2015-12-03 LAB — GC/CHLAMYDIA PROBE AMP (~~LOC~~) NOT AT ARMC
CHLAMYDIA, DNA PROBE: NEGATIVE
Neisseria Gonorrhea: NEGATIVE

## 2015-12-04 LAB — CULTURE, BETA STREP (GROUP B ONLY)

## 2015-12-04 LAB — OB RESULTS CONSOLE GBS: STREP GROUP B AG: NEGATIVE

## 2016-01-20 LAB — OB RESULTS CONSOLE GBS: STREP GROUP B AG: NEGATIVE

## 2016-01-22 ENCOUNTER — Inpatient Hospital Stay (HOSPITAL_COMMUNITY): Payer: Medicaid Other

## 2016-01-22 ENCOUNTER — Inpatient Hospital Stay (HOSPITAL_COMMUNITY)
Admission: AD | Admit: 2016-01-22 | Discharge: 2016-01-22 | Disposition: A | Discharge status: Home / Self Care | Payer: Medicaid Other | Source: Ambulatory Visit | Attending: Obstetrics and Gynecology | Admitting: Obstetrics and Gynecology

## 2016-01-22 ENCOUNTER — Encounter (HOSPITAL_COMMUNITY): Payer: Self-pay | Admitting: Certified Nurse Midwife

## 2016-01-22 DIAGNOSIS — O368390 Maternal care for abnormalities of the fetal heart rate or rhythm, unspecified trimester, not applicable or unspecified: Secondary | ICD-10-CM

## 2016-01-22 DIAGNOSIS — Z3A36 36 weeks gestation of pregnancy: Secondary | ICD-10-CM | POA: Diagnosis not present

## 2016-01-22 DIAGNOSIS — Z803 Family history of malignant neoplasm of breast: Secondary | ICD-10-CM | POA: Diagnosis not present

## 2016-01-22 DIAGNOSIS — N898 Other specified noninflammatory disorders of vagina: Secondary | ICD-10-CM | POA: Insufficient documentation

## 2016-01-22 DIAGNOSIS — O479 False labor, unspecified: Secondary | ICD-10-CM

## 2016-01-22 LAB — WET PREP, GENITAL
Clue Cells Wet Prep HPF POC: NONE SEEN
SPERM: NONE SEEN
TRICH WET PREP: NONE SEEN
Yeast Wet Prep HPF POC: NONE SEEN

## 2016-01-22 IMAGING — US US MFM FETAL BPP W/O NON-STRESS
1 series · 13 of 16 positions shown · non-contrast
Comparison: none

[Series 1: us mfm fetal bpp w/o non-stress · 16 acquisitions, 13 frames shown]
[im 1/16]
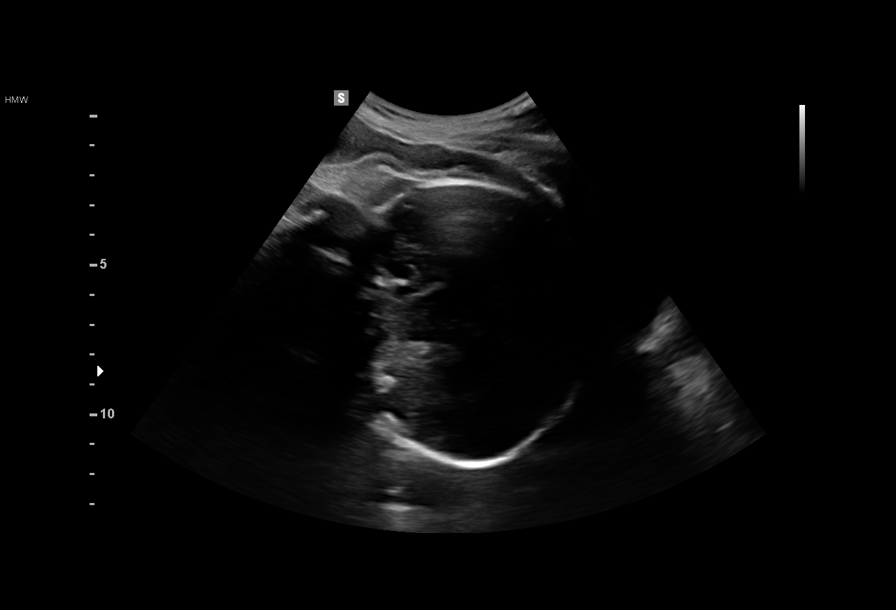
[im 2/16]
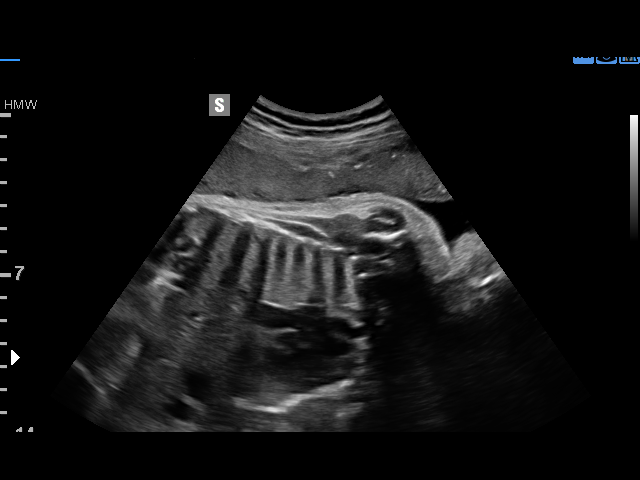
[im 4/16]
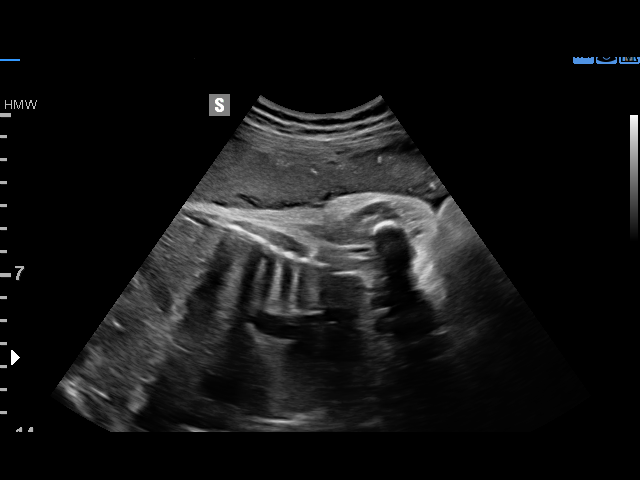
[im 5/16]
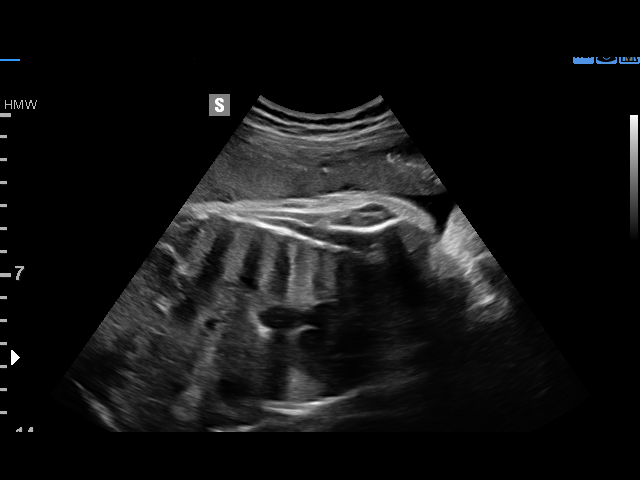
[im 6/16]
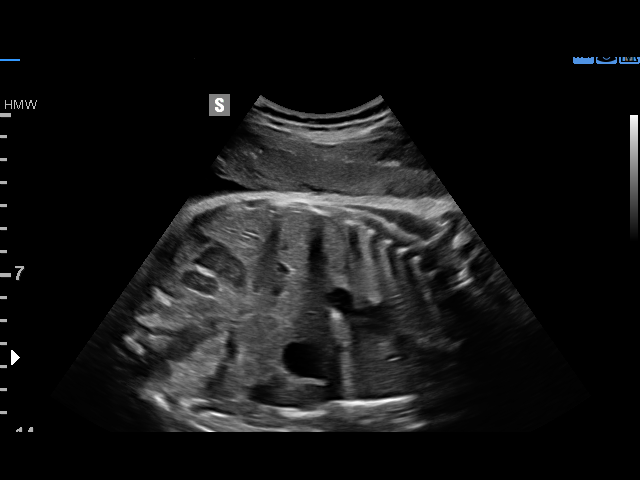
[im 7/16]
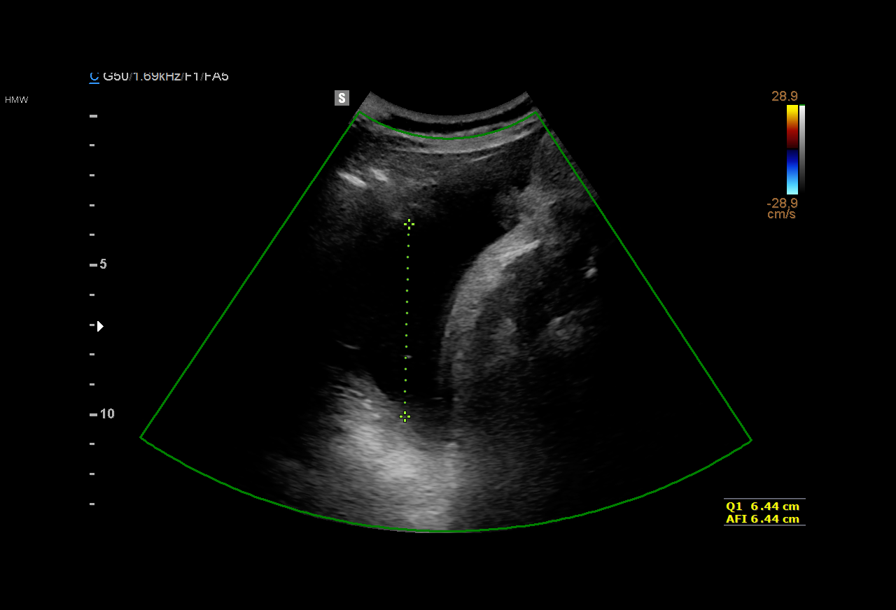
[im 9/16]
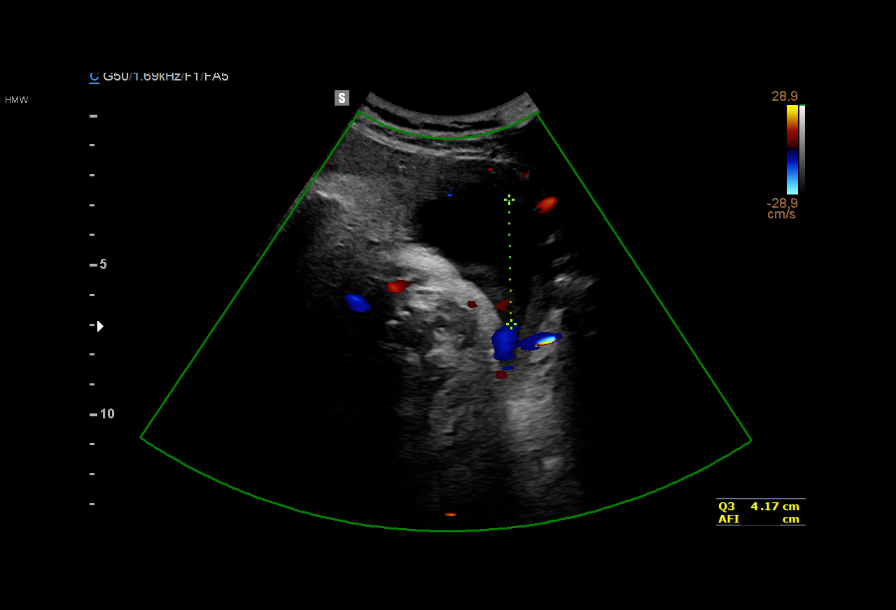
[im 10/16]
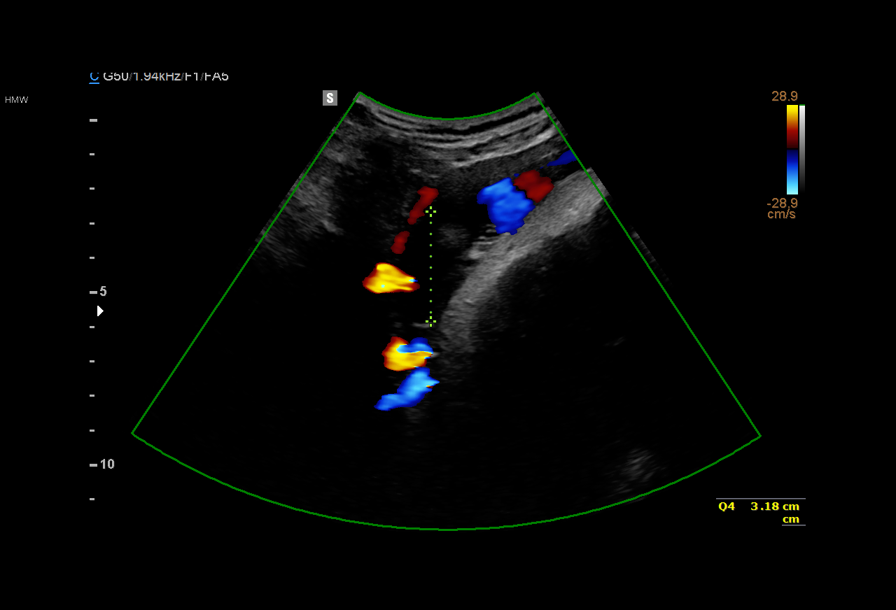
[im 11/16]
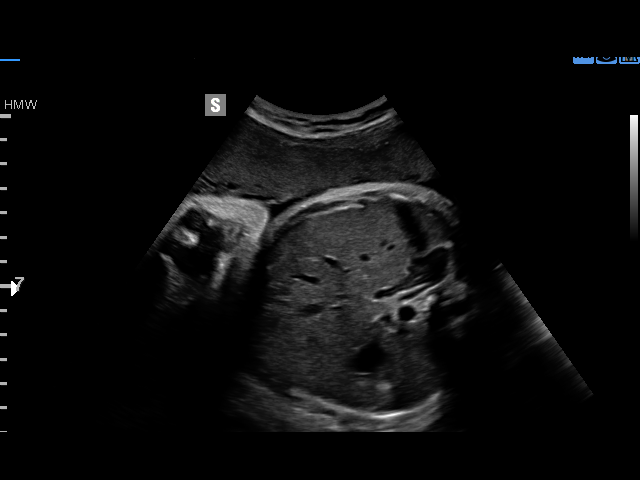
[im 12/16]
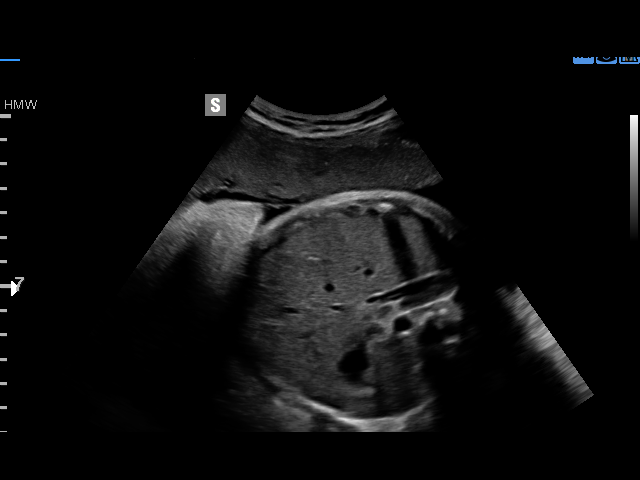
[im 13/16]
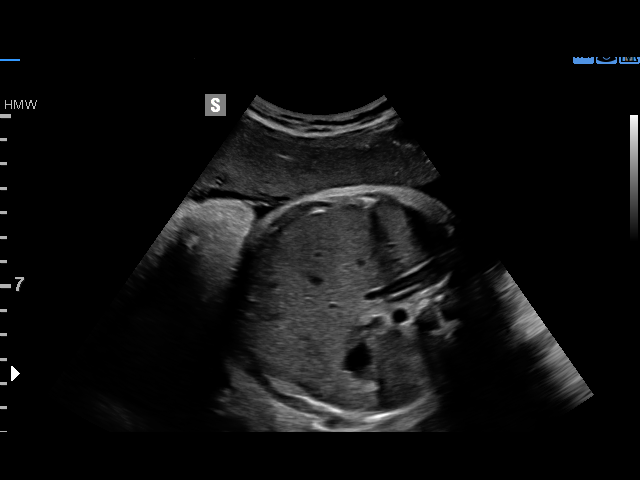
[im 15/16]
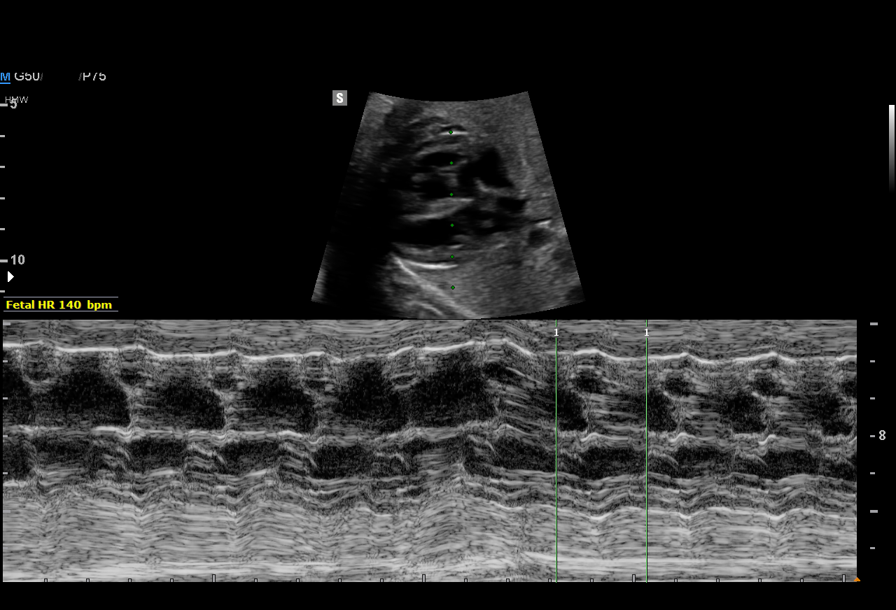
[im 16/16]
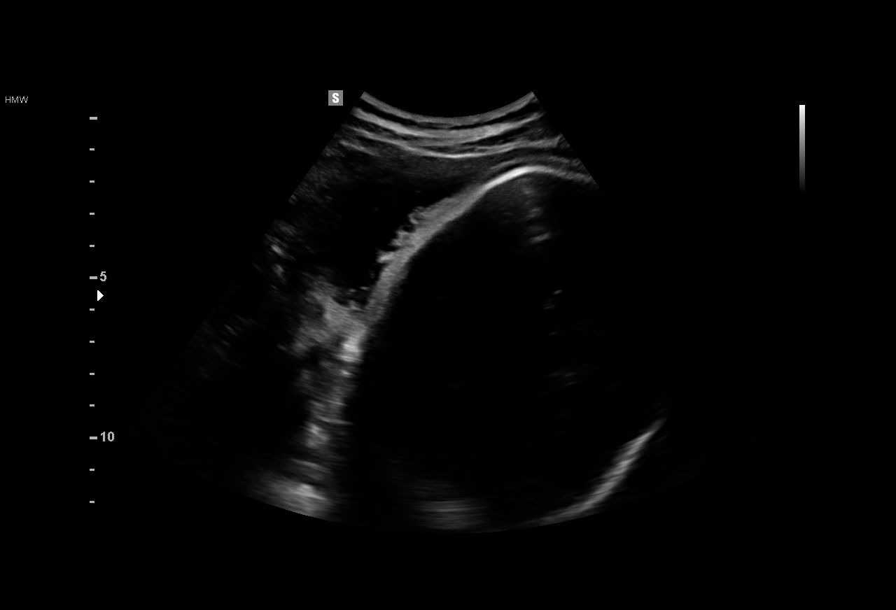

[13 of 16 positions shown; findings below may reference images not displayed]

MAU/Triage

1  KOHL             [PHONE_NUMBER]      [PHONE_NUMBER]     [PHONE_NUMBER]
Indications

36 weeks gestation of pregnancy
Fetal heart rate decelerations affecting       O76
management of mother
OB History

Gravidity:    1         Term:   0        Prem:   0         SAB:   0
TOP:          0       Ectopic:  0        Living: 0
Fetal Evaluation

Num Of Fetuses:     1
Fetal Heart         140
Rate(bpm):
Cardiac Activity:   Observed
Presentation:       Cephalic

Amniotic Fluid
AFI FV:      Subjectively within normal limits

AFI Sum(cm)     %Tile       Largest Pocket(cm)
17.93           67

RUQ(cm)       RLQ(cm)       LUQ(cm)        LLQ(cm)
6.44
Biophysical Evaluation

Amniotic F.V:   Within normal limits       F. Tone:         Observed
F. Movement:    Observed                   Score:           [DATE]
F. Breathing:   Observed
Gestational Age

LMP:           36w 0d        Date:  [DATE]                 EDD:    [DATE]
Best:          36w 0d     Det. By:  LMP  ([DATE])          EDD:    [DATE]
Impression

SIUP at 36+0 weeks
Cephalic presentation
Normal amniotic fluid volume
BPP [DATE]
Recommendations

Follow-up as clinically indicated

## 2016-01-22 NOTE — Discharge Instructions (Signed)

## 2016-01-22 NOTE — MAU Note (Signed)
Patient presents at 1736 weeksgestation with c/o contractions every 4-6 minutes and a green, lumpy discharge. Fetus active. Denies bleeding.
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
## 2016-02-09 ENCOUNTER — Encounter (HOSPITAL_COMMUNITY): Payer: Self-pay | Admitting: *Deleted

## 2016-02-09 ENCOUNTER — Inpatient Hospital Stay (HOSPITAL_COMMUNITY)
Admission: AD | Admit: 2016-02-09 | Discharge: 2016-02-10 | Disposition: A | Discharge status: Home / Self Care | Payer: Medicaid Other | Source: Ambulatory Visit | Attending: Obstetrics and Gynecology | Admitting: Obstetrics and Gynecology

## 2016-02-09 DIAGNOSIS — R21 Rash and other nonspecific skin eruption: Secondary | ICD-10-CM | POA: Diagnosis present

## 2016-02-09 DIAGNOSIS — O99013 Anemia complicating pregnancy, third trimester: Secondary | ICD-10-CM | POA: Diagnosis not present

## 2016-02-09 DIAGNOSIS — Z3A38 38 weeks gestation of pregnancy: Secondary | ICD-10-CM | POA: Diagnosis not present

## 2016-02-09 DIAGNOSIS — O99019 Anemia complicating pregnancy, unspecified trimester: Secondary | ICD-10-CM | POA: Diagnosis present

## 2016-02-09 DIAGNOSIS — O2686 Pruritic urticarial papules and plaques of pregnancy (PUPPP): Secondary | ICD-10-CM

## 2016-02-09 DIAGNOSIS — O26893 Other specified pregnancy related conditions, third trimester: Secondary | ICD-10-CM | POA: Diagnosis not present

## 2016-02-09 DIAGNOSIS — D649 Anemia, unspecified: Secondary | ICD-10-CM | POA: Insufficient documentation

## 2016-02-09 LAB — COMPREHENSIVE METABOLIC PANEL
ALBUMIN: 2.9 g/dL — AB (ref 3.5–5.0)
ALK PHOS: 124 U/L (ref 38–126)
ALT: 11 U/L — ABNORMAL LOW (ref 14–54)
ANION GAP: 8 (ref 5–15)
AST: 20 U/L (ref 15–41)
BUN: 13 mg/dL (ref 6–20)
CHLORIDE: 107 mmol/L (ref 101–111)
CO2: 23 mmol/L (ref 22–32)
Calcium: 8.8 mg/dL — ABNORMAL LOW (ref 8.9–10.3)
Creatinine, Ser: 0.5 mg/dL (ref 0.44–1.00)
GFR calc non Af Amer: >60 mL/min (ref >60)
GLUCOSE: 112 mg/dL — AB (ref 65–99)
POTASSIUM: 3.7 mmol/L (ref 3.5–5.1)
SODIUM: 138 mmol/L (ref 135–145)
Total Bilirubin: 0.4 mg/dL (ref 0.3–1.2)
Total Protein: 6.3 g/dL — ABNORMAL LOW (ref 6.5–8.1)

## 2016-02-09 LAB — CBC
HCT: 30 % — ABNORMAL LOW (ref 36.0–46.0)
HEMOGLOBIN: 9.5 g/dL — AB (ref 12.0–15.0)
MCH: 24.2 pg — ABNORMAL LOW (ref 26.0–34.0)
MCHC: 31.7 g/dL (ref 30.0–36.0)
MCV: 76.5 fL — ABNORMAL LOW (ref 78.0–100.0)
PLATELETS: 311 10*3/uL (ref 150–400)
RBC: 3.92 MIL/uL (ref 3.87–5.11)
RDW: 14.5 % (ref 11.5–15.5)
WBC: 12 10*3/uL — ABNORMAL HIGH (ref 4.0–10.5)

## 2016-02-09 MED ORDER — FERROUS SULFATE 325 (65 FE) MG PO TABS: 325.0000 mg | ORAL_TABLET | Freq: Two times a day (BID) | ORAL | Status: DC

## 2016-02-09 MED ORDER — HYDROXYZINE HCL 10 MG PO TABS: 10.0000 mg | ORAL_TABLET | Freq: Three times a day (TID) | ORAL | Status: DC | PRN

## 2016-02-09 MED ORDER — TRIAMCINOLONE ACETONIDE 0.5 % EX CREA: 1.0000 "application " | TOPICAL_CREAM | Freq: Four times a day (QID) | CUTANEOUS | Status: DC

## 2016-02-09 MED ORDER — HYDROXYZINE HCL 10 MG PO TABS
10.0000 mg | ORAL_TABLET | Freq: Three times a day (TID) | ORAL | Status: DC | PRN
  Administered 2016-02-09: 10 mg via ORAL
  Filled 2016-02-09 (×2): qty 1

## 2016-02-09 MED ORDER — TRIAMCINOLONE ACETONIDE 0.5 % EX CREA
TOPICAL_CREAM | Freq: Once | CUTANEOUS | Status: AC
  Administered 2016-02-09: 23:00:00 via TOPICAL
  Filled 2016-02-09: qty 15
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
## 2016-02-09 NOTE — Progress Notes (Signed)
Notified of pt arrival in MAU. Will come see pt. Ok to monitor for reactive NST and take off monitors after.
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
## 2016-02-09 NOTE — Discharge Instructions (Signed)
Pruritic Urticarial Papules and Plaques of Pregnancy  When you are pregnant, your body changes in many ways. That includes the skin. Rashes sometimes develop. One skin rash that can happen during pregnancy is called pruritic urticarial papules and plaques of pregnancy (PUPPP). The small red bumps sometimes form large plaques. These are very itchy. The rash usually appears in the last few weeks of pregnancy during the third trimester. Sometimes, it can occur shortly after giving birth. It goes away shortly after your baby is born. It does not harm you or your baby and will not leave scars on your skin. PUPPP is most common in first pregnancies or in those involving more than one baby. It usually will not return during later pregnancies.  CAUSES   The exact cause is unknown. However, it may be related to your skin stretching rapidly due to pregnancy.   SYMPTOMS   PUPPP symptoms include a very itchy rash. The rash often looks red and raised and is most often seen on the abdomen. It can spread to the legs, thighs, or arms. Sometimes tiny blisters form in the center of the rash patches. The skin around the rash is often pale.  DIAGNOSIS   To decide if you have PUPPP, your health care provider will perform a physical exam and ask questions about your symptoms. He or she may order blood tests to rule out other causes of the rash.  TREATMENT   The goal is to stop the itching and keep the rash from spreading. Usually, a cream is used to do this. However, treatment varies. Common options include medicines that relieve or lessen itching. Some medicines may be in the form of a cream or ointment, while others you may take by mouth (orally). The medicines are either corticosteroids or antihistamines. Treatment helps nearly all women with this rash. The creams, ointments, or pills should make your skin feel better fairly quickly.   HOME CARE INSTRUCTIONS   · Only take over-the-counter or prescription medicines as directed by your  health care provider.  · Apply any creams as directed by your health care provider.  · Do not scratch the rash.  · Wear loose clothing.  · Keep all follow-up appointments with your health care provider.  SEEK MEDICAL CARE IF:   · The itching does not go away after treatment.  · Your rash continues to spread.  · You are unable to sleep because of the irritation.     This information is not intended to replace advice given to you by your health care provider. Make sure you discuss any questions you have with your health care provider.     Document Released: 12/07/2009 Document Revised: 05/15/2013 Document Reviewed: 02/24/2013  Elsevier Interactive Patient Education ©2016 [employer]

## 2016-02-11 LAB — BILE ACIDS, TOTAL: Bile Acids Total: 8.9 umol/L (ref 4.7–24.5)

## 2016-02-13 ENCOUNTER — Inpatient Hospital Stay (HOSPITAL_COMMUNITY): Payer: Medicaid Other | Admitting: Anesthesiology

## 2016-02-13 ENCOUNTER — Inpatient Hospital Stay (HOSPITAL_COMMUNITY)
Admission: AD | Admit: 2016-02-13 | Discharge: 2016-02-15 | DRG: 775 | Disposition: A | Discharge status: Home / Self Care | Payer: Medicaid Other | Source: Ambulatory Visit | Attending: Obstetrics and Gynecology | Admitting: Obstetrics and Gynecology

## 2016-02-13 ENCOUNTER — Encounter (HOSPITAL_COMMUNITY): Payer: Self-pay | Admitting: *Deleted

## 2016-02-13 DIAGNOSIS — O9902 Anemia complicating childbirth: Secondary | ICD-10-CM | POA: Diagnosis present

## 2016-02-13 DIAGNOSIS — Z6830 Body mass index (BMI) 30.0-30.9, adult: Secondary | ICD-10-CM | POA: Diagnosis not present

## 2016-02-13 DIAGNOSIS — O2686 Pruritic urticarial papules and plaques of pregnancy (PUPPP): Secondary | ICD-10-CM | POA: Diagnosis present

## 2016-02-13 DIAGNOSIS — Z3A39 39 weeks gestation of pregnancy: Secondary | ICD-10-CM

## 2016-02-13 DIAGNOSIS — O99214 Obesity complicating childbirth: Secondary | ICD-10-CM | POA: Diagnosis present

## 2016-02-13 DIAGNOSIS — D649 Anemia, unspecified: Secondary | ICD-10-CM | POA: Diagnosis present

## 2016-02-13 DIAGNOSIS — Z823 Family history of stroke: Secondary | ICD-10-CM

## 2016-02-13 DIAGNOSIS — Z833 Family history of diabetes mellitus: Secondary | ICD-10-CM | POA: Diagnosis not present

## 2016-02-13 DIAGNOSIS — E669 Obesity, unspecified: Secondary | ICD-10-CM | POA: Diagnosis present

## 2016-02-13 LAB — CBC
HEMATOCRIT: 30.8 % — AB (ref 36.0–46.0)
HEMOGLOBIN: 9.7 g/dL — AB (ref 12.0–15.0)
MCH: 23.5 pg — ABNORMAL LOW (ref 26.0–34.0)
MCHC: 31.5 g/dL (ref 30.0–36.0)
MCV: 74.6 fL — AB (ref 78.0–100.0)
Platelets: 325 10*3/uL (ref 150–400)
RBC: 4.13 MIL/uL (ref 3.87–5.11)
RDW: 14.7 % (ref 11.5–15.5)
WBC: 12 10*3/uL — AB (ref 4.0–10.5)

## 2016-02-13 LAB — RPR: RPR: NONREACTIVE

## 2016-02-13 LAB — HIV ANTIBODY (ROUTINE TESTING W REFLEX): HIV SCREEN 4TH GENERATION: NONREACTIVE

## 2016-02-13 LAB — TYPE AND SCREEN
ABO/RH(D): O POS
ANTIBODY SCREEN: NEGATIVE

## 2016-02-13 MED ORDER — FLEET ENEMA 7-19 GM/118ML RE ENEM: 1.0000 | ENEMA | RECTAL | Status: DC | PRN

## 2016-02-13 MED ORDER — TERBUTALINE SULFATE 1 MG/ML IJ SOLN
0.2500 mg | Freq: Once | INTRAMUSCULAR | Status: DC | PRN
  Filled 2016-02-13: qty 1

## 2016-02-13 MED ORDER — OXYCODONE-ACETAMINOPHEN 5-325 MG PO TABS
2.0000 | ORAL_TABLET | ORAL | Status: DC | PRN
Start: 2016-02-13 — End: 2016-02-15
  Administered 2016-02-13 – 2016-02-15 (×9): 2 via ORAL
  Filled 2016-02-13 (×9): qty 2

## 2016-02-13 MED ORDER — PHENYLEPHRINE 40 MCG/ML (10ML) SYRINGE FOR IV PUSH (FOR BLOOD PRESSURE SUPPORT)
PREFILLED_SYRINGE | INTRAVENOUS | Status: AC
  Filled 2016-02-13: qty 20

## 2016-02-13 MED ORDER — LIDOCAINE HCL (PF) 1 % IJ SOLN
30.0000 mL | INTRAMUSCULAR | Status: AC | PRN
  Administered 2016-02-13: 30 mL via SUBCUTANEOUS
  Filled 2016-02-13: qty 30

## 2016-02-13 MED ORDER — IBUPROFEN 600 MG PO TABS
600.0000 mg | ORAL_TABLET | Freq: Four times a day (QID) | ORAL | Status: DC
  Administered 2016-02-13 – 2016-02-15 (×8): 600 mg via ORAL
  Filled 2016-02-13 (×8): qty 1

## 2016-02-13 MED ORDER — LIDOCAINE HCL (PF) 1 % IJ SOLN
INTRAMUSCULAR | Status: DC | PRN
  Administered 2016-02-13 (×2): 4 mL via EPIDURAL

## 2016-02-13 MED ORDER — COCONUT OIL OIL
1.0000 | TOPICAL_OIL | Status: DC | PRN
Start: 2016-02-13 — End: 2016-02-15

## 2016-02-13 MED ORDER — OXYTOCIN 40 UNITS IN LACTATED RINGERS INFUSION - SIMPLE MED
2.5000 [IU]/h | INTRAVENOUS | Status: DC
  Filled 2016-02-13: qty 1000

## 2016-02-13 MED ORDER — FENTANYL 2.5 MCG/ML BUPIVACAINE 1/10 % EPIDURAL INFUSION (WH - ANES)
14.0000 mL/h | INTRAMUSCULAR | Status: DC | PRN
  Administered 2016-02-13 (×2): 14 mL/h via EPIDURAL
  Filled 2016-02-13 (×2): qty 125

## 2016-02-13 MED ORDER — FENTANYL CITRATE (PF) 100 MCG/2ML IJ SOLN: 100.0000 ug | INTRAMUSCULAR | Status: DC | PRN

## 2016-02-13 MED ORDER — PHENYLEPHRINE 40 MCG/ML (10ML) SYRINGE FOR IV PUSH (FOR BLOOD PRESSURE SUPPORT)
80.0000 ug | PREFILLED_SYRINGE | INTRAVENOUS | Status: DC | PRN
  Filled 2016-02-13: qty 5

## 2016-02-13 MED ORDER — CITRIC ACID-SODIUM CITRATE 334-500 MG/5ML PO SOLN: 30.0000 mL | ORAL | Status: DC | PRN

## 2016-02-13 MED ORDER — OXYTOCIN 40 UNITS IN LACTATED RINGERS INFUSION - SIMPLE MED
1.0000 m[IU]/min | INTRAVENOUS | Status: DC
  Administered 2016-02-13: 1 m[IU]/min via INTRAVENOUS

## 2016-02-13 MED ORDER — DIBUCAINE 1 % RE OINT
1.0000 "application " | TOPICAL_OINTMENT | RECTAL | Status: DC | PRN
  Administered 2016-02-13 – 2016-02-14 (×2): 1 via RECTAL
  Filled 2016-02-13 (×2): qty 28

## 2016-02-13 MED ORDER — MISOPROSTOL 200 MCG PO TABS
1000.0000 ug | ORAL_TABLET | Freq: Once | ORAL | Status: AC
Start: 2016-02-13 — End: 2016-02-13
  Administered 2016-02-13: 1000 ug via RECTAL

## 2016-02-13 MED ORDER — PRENATAL MULTIVITAMIN CH
1.0000 | ORAL_TABLET | Freq: Every day | ORAL | Status: DC
  Administered 2016-02-14 – 2016-02-15 (×2): 1 via ORAL
  Filled 2016-02-13 (×2): qty 1

## 2016-02-13 MED ORDER — OXYTOCIN BOLUS FROM INFUSION: 500.0000 mL | INTRAVENOUS | Status: DC

## 2016-02-13 MED ORDER — LACTATED RINGERS IV SOLN: 500.0000 mL | Freq: Once | INTRAVENOUS | Status: DC

## 2016-02-13 MED ORDER — OXYCODONE-ACETAMINOPHEN 5-325 MG PO TABS: 1.0000 | ORAL_TABLET | ORAL | Status: DC | PRN

## 2016-02-13 MED ORDER — OXYCODONE-ACETAMINOPHEN 5-325 MG PO TABS
1.0000 | ORAL_TABLET | ORAL | Status: DC | PRN
  Administered 2016-02-13: 1 via ORAL
  Filled 2016-02-13: qty 1

## 2016-02-13 MED ORDER — ACETAMINOPHEN 325 MG PO TABS
650.0000 mg | ORAL_TABLET | ORAL | Status: DC | PRN
Start: 2016-02-13 — End: 2016-02-15

## 2016-02-13 MED ORDER — ZOLPIDEM TARTRATE 5 MG PO TABS: 5.0000 mg | ORAL_TABLET | Freq: Every evening | ORAL | Status: DC | PRN

## 2016-02-13 MED ORDER — WITCH HAZEL-GLYCERIN EX PADS
1.0000 "application " | MEDICATED_PAD | CUTANEOUS | Status: DC | PRN
  Administered 2016-02-13 – 2016-02-14 (×2): 1 via TOPICAL

## 2016-02-13 MED ORDER — SIMETHICONE 80 MG PO CHEW
80.0000 mg | CHEWABLE_TABLET | ORAL | Status: DC | PRN
Start: 2016-02-13 — End: 2016-02-15

## 2016-02-13 MED ORDER — OXYCODONE-ACETAMINOPHEN 5-325 MG PO TABS: 2.0000 | ORAL_TABLET | ORAL | Status: DC | PRN

## 2016-02-13 MED ORDER — DIPHENHYDRAMINE HCL 50 MG/ML IJ SOLN
12.5000 mg | INTRAMUSCULAR | Status: DC | PRN
Start: 2016-02-13 — End: 2016-02-13

## 2016-02-13 MED ORDER — FERROUS SULFATE 325 (65 FE) MG PO TABS
325.0000 mg | ORAL_TABLET | Freq: Two times a day (BID) | ORAL | Status: DC
  Administered 2016-02-13 – 2016-02-15 (×4): 325 mg via ORAL
  Filled 2016-02-13 (×4): qty 1

## 2016-02-13 MED ORDER — TETANUS-DIPHTH-ACELL PERTUSSIS 5-2.5-18.5 LF-MCG/0.5 IM SUSP
0.5000 mL | Freq: Once | INTRAMUSCULAR | Status: AC
  Administered 2016-02-14: 0.5 mL via INTRAMUSCULAR
  Filled 2016-02-13: qty 0.5

## 2016-02-13 MED ORDER — LACTATED RINGERS IV SOLN: 500.0000 mL | INTRAVENOUS | Status: DC | PRN

## 2016-02-13 MED ORDER — LACTATED RINGERS IV SOLN
INTRAVENOUS | Status: DC
  Administered 2016-02-13 (×2): via INTRAVENOUS

## 2016-02-13 MED ORDER — ONDANSETRON HCL 4 MG/2ML IJ SOLN: 4.0000 mg | INTRAMUSCULAR | Status: DC | PRN

## 2016-02-13 MED ORDER — DIPHENHYDRAMINE HCL 25 MG PO CAPS: 25.0000 mg | ORAL_CAPSULE | Freq: Four times a day (QID) | ORAL | Status: DC | PRN

## 2016-02-13 MED ORDER — MISOPROSTOL 200 MCG PO TABS
ORAL_TABLET | ORAL | Status: AC
  Filled 2016-02-13: qty 5

## 2016-02-13 MED ORDER — ONDANSETRON HCL 4 MG/2ML IJ SOLN: 4.0000 mg | Freq: Four times a day (QID) | INTRAMUSCULAR | Status: DC | PRN

## 2016-02-13 MED ORDER — SENNOSIDES-DOCUSATE SODIUM 8.6-50 MG PO TABS
2.0000 | ORAL_TABLET | ORAL | Status: DC
  Administered 2016-02-13 – 2016-02-15 (×2): 2 via ORAL
  Filled 2016-02-13 (×2): qty 2

## 2016-02-13 MED ORDER — ONDANSETRON HCL 4 MG PO TABS
4.0000 mg | ORAL_TABLET | ORAL | Status: DC | PRN
Start: 2016-02-13 — End: 2016-02-15

## 2016-02-13 MED ORDER — FENTANYL 2.5 MCG/ML BUPIVACAINE 1/10 % EPIDURAL INFUSION (WH - ANES)
INTRAMUSCULAR | Status: AC
  Filled 2016-02-13: qty 125

## 2016-02-13 MED ORDER — ACETAMINOPHEN 325 MG PO TABS
650.0000 mg | ORAL_TABLET | ORAL | Status: DC | PRN
  Administered 2016-02-13: 650 mg via ORAL
  Filled 2016-02-13: qty 2

## 2016-02-13 MED ORDER — LACTATED RINGERS IV SOLN
INTRAVENOUS | Status: DC
  Administered 2016-02-13: 1 mL via INTRAUTERINE
  Administered 2016-02-13: 09:00:00 via INTRAUTERINE

## 2016-02-13 MED ORDER — EPHEDRINE 5 MG/ML INJ
10.0000 mg | INTRAVENOUS | Status: DC | PRN
  Filled 2016-02-13: qty 2

## 2016-02-13 MED ORDER — BENZOCAINE-MENTHOL 20-0.5 % EX AERO
1.0000 "application " | INHALATION_SPRAY | CUTANEOUS | Status: DC | PRN
  Administered 2016-02-13 – 2016-02-14 (×2): 1 via TOPICAL
  Filled 2016-02-13 (×2): qty 56

## 2016-02-13 NOTE — MAU Note (Signed)
Pt reports having ctx since 9:30q5 min denies SROM or bleeding. 2cm in office today
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
## 2016-02-13 NOTE — Progress Notes (Signed)
Subjective: In to greet patient, husband and family.  Pt doing well, comfortable with epidural. Reports feeling more rectal pressure over the last 1/2 hour like she "has to fart".   Objective: BP 99/52 mmHg  Pulse 79  Temp(Src) 98 F (36.7 C) (Oral)  Resp 18  Ht 5\' 4"  (1.626 m)  Wt 81.194 kg (179 lb)  BMI 30.71 kg/m2  SpO2 99%      FHT:  OverallCategory 2, 125 bpm, moderate variability, +accels, occasional late variable deceleration  UC:   regular, every 2-3 minutes SVE:   Dilation: Lip/rim Effacement (%): 90 Station: 0, +1 Exam by:: Anthone Prieur Membranes:   AROM at 0255  Augmentation:  Cytotec:none Pitocin:  3 mu/min Internal monitors:  IUPC with Amnioinfusion running   Pain management:  Epidural, placed at 0309   Assessment:  IUP 39.1 wks Transitional labor AROM x 6hrs, afebrile MSF Amnioinfusion GBS negative  Plan: Continue augmentation process Continue amnioinfusion Intrauterine resuscitation measures PRN ( maternal position, IV bolus, ect)  Labor down and recheck within 1-2 hr  Anticipate SVD   Alphonzo Severanceachel Tanish Prien CNM, [place] 02/13/2016, 8:13 AM

## 2016-02-13 NOTE — Progress Notes (Signed)
  Subjective: Epidural just placed, getting comfortable--started leaking fluid just prior to epidural placement.  Had gotten more uncomfortable just prior to placement.  Objective: BP 104/88 mmHg  Pulse 79  Temp(Src) 98.1 F (36.7 C)  Resp 18  Ht 5\' 4"  (1.626 m)  Wt 81.194 kg (179 lb)  BMI 30.71 kg/m2  SpO2 99%      FHT: Category 1 UC:   regular, every 3 minutes SVE:   Dilation: 6 Effacement (%): 80 Station: -1 Exam by:: Manfred ArchV. Makinna Andy CNM   Leaking small amount MSF--forewaters noted. AROM, with moderate MSF noted IUPC inserted without difficulty   Assessment:  Active labor MSF  Plan: Continue to observe labor progress. Amnioinfusion due to MSF.  Nigel BridgemanLATHAM, Veronica Cooke CNM 02/13/2016, 3:40 AM

## 2016-02-13 NOTE — Progress Notes (Signed)
Pt states she is afraid she can't breath. Pulse normal. O2 sats normal. Pt encouraged to take slow deep breaths.Pt shaking, BP unable to take valid reading.

## 2016-02-13 NOTE — Progress Notes (Signed)
Mother shaking, head of bed lowered. Blankets added

## 2016-02-13 NOTE — Anesthesia Postprocedure Evaluation (Signed)
Anesthesia Post Note  Patient: Veronica Cooke  Procedure(s) Performed: * No procedures listed *  Patient location during evaluation: Mother Baby Anesthesia Type: Epidural Level of consciousness: awake and alert Pain management: satisfactory to patient Vital Signs Assessment: post-procedure vital signs reviewed and stable Respiratory status: respiratory function stable Cardiovascular status: stable Postop Assessment: no headache, no backache, epidural receding, patient able to bend at knees, no signs of nausea or vomiting and adequate PO intake Anesthetic complications: no Comments: Comfort level was assessed by AnesthesiaTeam and the patient was pleased with the care, interventions, and services provided by the Department of Anesthesia.     Last Vitals:  Filed Vitals:   02/13/16 1530 02/13/16 1629  BP: 116/68 116/67  Pulse: 101 100  Temp: 37.6 C 37.5 C  Resp: 20 20    Last Pain:  Filed Vitals:   02/13/16 2112  PainSc: 7    Pain Goal: Patients Stated Pain Goal: 2 (02/13/16 0257)               Karleen DolphinFUSSELL,Jailani Hogans

## 2016-02-13 NOTE — Lactation Note (Signed)
This note was copied from a baby's chart. Lactation Consultation Note  Patient Name: Veronica Cooke HQION'GToday's Date: 02/13/2016 Reason for consult: Initial assessment  Baby 9 hours old. Baby just had bath and is STS on mom's chest. Assisted with latching baby to left breast in football position. Baby sleepy and could no get baby to suckle this LC's gloved finger. Enc mom to continue to offer STS. [facility], aware of OP/BFSG and LC phone line assistance after D/C.  Maternal Data Has patient been taught Hand Expression?: Yes Does the patient have breastfeeding experience prior to this delivery?: No  Feeding Feeding Type: Breast Fed Length of feed: 0 min  LATCH Score/Interventions Latch: Too sleepy or reluctant, no latch achieved, no sucking elicited. Intervention(s): Skin to skin;Waking techniques Intervention(s): Adjust position;Assist with latch;Breast compression  Audible Swallowing: None  Type of Nipple: Flat (left nipple)  Comfort (Breast/Nipple): Soft / non-tender     Hold (Positioning): Assistance needed to correctly position infant at breast and maintain latch.  LATCH Score: 4  Lactation Tools Discussed/Used     Consult Status Consult Status: Follow-up Date: 02/14/16 Follow-up type: In-patient    [name], Veronica Witter 02/13/2016, 9:48 PM

## 2016-02-13 NOTE — Progress Notes (Signed)
Mother still shaking, deep breath encouraged. O2 sats normal

## 2016-02-13 NOTE — Anesthesia Procedure Notes (Signed)
Epidural Patient location during procedure: OB Start time: 02/13/2016 3:04 AM  Staffing Anesthesiologist: Mal AmabileFOSTER, Akshith Moncus  Preanesthetic Checklist Completed: patient identified, site marked, surgical consent, pre-op evaluation, timeout performed, IV checked, risks and benefits discussed and monitors and equipment checked  Epidural Patient position: sitting Prep: site prepped and draped and DuraPrep Patient monitoring: continuous pulse ox and blood pressure Approach: midline Location: L3-L4 Injection technique: LOR air  Needle:  Needle type: Tuohy  Needle gauge: 17 G Needle length: 9 cm and 9 Needle insertion depth: 5 cm cm Catheter type: closed end flexible Catheter size: 19 Gauge Catheter at skin depth: 10 cm Test dose: negative and Other  Assessment Events: blood not aspirated, injection not painful, no injection resistance, negative IV test and no paresthesia  Additional Notes Patient identified. Risks and benefits discussed including failed block, incomplete  Pain control, post dural puncture headache, nerve damage, paralysis, blood pressure Changes, nausea, vomiting, reactions to medications-both toxic and allergic and post Partum back pain. All questions were answered. Patient expressed understanding and wished to proceed. Sterile technique was used throughout procedure. Epidural site was Dressed with sterile barrier dressing. No paresthesias, signs of intravascular injection Or signs of intrathecal spread were encountered.  Patient was more comfortable after the epidural was dosed. Please see RN's note for documentation of vital signs and FHR which are stable.

## 2016-02-13 NOTE — Progress Notes (Signed)
Subjective: Comfortable, pushing  Objective: BP 113/72 mmHg  Pulse 97  Temp(Src) 98.2 F (36.8 C) (Oral)  Resp 18  Ht 5\' 4"  (1.626 m)  Wt 81.194 kg (179 lb)  BMI 30.71 kg/m2  SpO2 99%      FHT: Category 2, 135-140, moderate variability, early decelerations and variables UC:   regular, every 2 minutes SVE:   Dilation: 10 Effacement (%): 90 Station: +1 Exam by:: Smith  Membranes: AROM at 0255  Augmentation:  Cytotec:none Pitocin: 4 mu/min Internal monitors: IUPC, amnioinfusion stopped  Pain management: Epidural, placed at 0309   Assessment:   IUP 39.1 wks Second stage labor AROM x 9 hrs, afebrile MSF GBS negative Cat 2 FT  Plan: Continue augmentation process Continue pushing Anticipate SVD   Alphonzo Severanceachel Jackalyn Haith CNM, [place] 02/13/2016, 12:09 PM

## 2016-02-13 NOTE — Anesthesia Preprocedure Evaluation (Signed)
Anesthesia Evaluation  Patient identified by MRN, date of birth, ID band Patient awake    Reviewed: Allergy & Precautions, Patient's Chart, lab work & pertinent test results  Airway Mallampati: II  TM Distance: >3 FB Neck ROM: Full    Dental no notable dental hx. (+) Teeth Intact   Pulmonary neg pulmonary ROS,    Pulmonary exam normal breath sounds clear to auscultation       Cardiovascular negative cardio ROS Normal cardiovascular exam Rhythm:Regular Rate:Normal     Neuro/Psych negative neurological ROS  negative psych ROS   GI/Hepatic negative GI ROS, Neg liver ROS,   Endo/Other  Obesity  Renal/GU negative Renal ROS  negative genitourinary   Musculoskeletal negative musculoskeletal ROS (+)   Abdominal (+) + obese,   Peds  Hematology  (+) anemia ,   Anesthesia Other Findings   Reproductive/Obstetrics (+) Pregnancy                             Anesthesia Physical Anesthesia Plan  ASA: II  Anesthesia Plan: Epidural   Post-op Pain Management:    Induction:   Airway Management Planned: Natural Airway  Additional Equipment:   Intra-op Plan:   Post-operative Plan:   Informed Consent: I have reviewed the patients History and Physical, chart, labs and discussed the procedure including the risks, benefits and alternatives for the proposed anesthesia with the patient or authorized representative who has indicated his/her understanding and acceptance.     Plan Discussed with: Anesthesiologist  Anesthesia Plan Comments:         Anesthesia Quick Evaluation

## 2016-02-13 NOTE — Progress Notes (Signed)
Pt calm now. Shaking subsided. BP normal. O2 sats normal.

## 2016-02-14 LAB — CBC
HEMATOCRIT: 26.5 % — AB (ref 36.0–46.0)
HEMOGLOBIN: 8.4 g/dL — AB (ref 12.0–15.0)
MCH: 23.9 pg — AB (ref 26.0–34.0)
MCHC: 31.7 g/dL (ref 30.0–36.0)
MCV: 75.5 fL — ABNORMAL LOW (ref 78.0–100.0)
Platelets: 225 10*3/uL (ref 150–400)
RBC: 3.51 MIL/uL — AB (ref 3.87–5.11)
RDW: 15 % (ref 11.5–15.5)
WBC: 12.3 10*3/uL — ABNORMAL HIGH (ref 4.0–10.5)

## 2016-02-14 NOTE — Lactation Note (Addendum)
This note was copied from a baby's chart. Lactation Consultation Note  Patient Name: Veronica Aloha Gellicole Rosten ONGEX'BToday's Date: 02/14/2016 Reason for consult: Follow-up assessment  Baby 32 hours old. Mom's left nipple is everted, and is abraded and bruised. Mom's right nipple everts more than left. Parents reports that mom has been using a #20 NS today. Assisted mom to attempt to latch baby in football position to left breast, first without shield and then able to latch baby with shield. Enc mom to use EBM on nipple. Baby latched to NS and able to transfer milk from breast. Lots of colostrum noted in NS. Baby maintained a deep latch, suckled rhythmically with intermittent swallows. Demonstrated to parents how to flange lower lip, and mom reported increased comfort--no more pinching of left nipple. After baby nursed for a few minutes, attempted to latch baby without shield. However, baby would not latch. Enc mom to continue to use shield as needed, but to continue to attempt to latch baby without the shield a few times a day at least. Discussed with mom that NS a temporary device. Discussed additional pumping to protect milk supply--mom has hand pump in the room and a DEBP at home. Also discussed the need to watch baby's weight carefully while using NS. Mom aware of OP/BFSG and LC phone line assistance after D/C. Mom given comfort gels with instructions, and reminded of cluster-feeding.  Maternal Data    Feeding Feeding Type: Breast Fed Length of feed:  (LC assessed first 15 minutes of BF. )  LATCH Score/Interventions Latch: Grasps breast easily, tongue down, lips flanged, rhythmical sucking. Intervention(s): Skin to skin  Audible Swallowing: Spontaneous and intermittent  Type of Nipple: Inverted (Left nipple appears inverted.)  Comfort (Breast/Nipple): Filling, red/small blisters or bruises, mild/mod discomfort  Problem noted: Mild/Moderate discomfort (abraded/bruised) Interventions (Mild/moderate  discomfort): Hand expression;Pre-pump if needed;Post-pump  Hold (Positioning): Assistance needed to correctly position infant at breast and maintain latch. Intervention(s): Breastfeeding basics reviewed;Support Pillows;Position options;Skin to skin  LATCH Score: 6  Lactation Tools Discussed/Used Tools: Nipple Shields Nipple shield size: 20   Consult Status Consult Status: Follow-up Date: 02/15/16 Follow-up type: In-patient    Veronica Cooke, Veronica Cooke 02/14/2016, 9:31 PM
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
## 2016-02-14 NOTE — Progress Notes (Signed)
Subjective: Postpartum Day 1 : Vaginal delivery, 2nd degree extended into a superficial left labial, and a right superficial periurethral laceration Patient up ad lib, reports no syncope or dizziness. Pain controlled with Percocet Feeding:  Breast Contraceptive plan:  undecided  Objective: Vital signs in last 24 hours: Temp:  [97.8 F (36.6 C)-99.6 F (37.6 C)] 97.8 F (36.6 C) (05/21 0522) Pulse Rate:  [79-207] 79 (05/21 0522) Resp:  [18-20] 20 (05/21 0522) BP: (72-205)/(45-175) 108/59 mmHg (05/21 0522) SpO2:  [84 %-100 %] 99 % (05/20 1452)   Filed Vitals:   02/13/16 1530 02/13/16 1629 02/13/16 2112 02/14/16 0522  BP: 116/68 116/67 118/67 108/59  Pulse: 101 100 99 79  Temp: 99.6 F (37.6 C) 99.5 F (37.5 C) 98 F (36.7 C) 97.8 F (36.6 C)  TempSrc: Oral Oral    Resp: 20 20 18 20   Height:      Weight:      SpO2:          Physical Exam:  General: alert and cooperative Lochia: appropriate Uterine Fundus: firm Perineum: healing well DVT Evaluation: No evidence of DVT seen on physical exam.   CBC Latest Ref Rng 02/14/2016 02/13/2016 02/09/2016  WBC 4.0 - 10.5 K/uL 12.3(H) 12.0(H) 12.0(H)  Hemoglobin 12.0 - 15.0 g/dL 1.6(X8.4(L) 0.9(U9.7(L) 0.4(V9.5(L)  Hematocrit 36.0 - 46.0 % 26.5(L) 30.8(L) 30.0(L)  Platelets 150 - 400 K/uL 225 325 311     Assessment/Plan: Status post vaginal delivery day 1 Anemia - Iron tabs, PO, BID Stable Continue current care. Plan for discharge tomorrow and Breastfeeding    Beatrix FettersRachel StallCNM 02/14/2016, 10:00 AM

## 2016-02-15 ENCOUNTER — Ambulatory Visit: Payer: Self-pay

## 2016-02-15 MED ORDER — IBUPROFEN 600 MG PO TABS: 600.0000 mg | ORAL_TABLET | Freq: Four times a day (QID) | ORAL | Status: DC

## 2016-02-15 MED ORDER — OXYCODONE-ACETAMINOPHEN 5-325 MG PO TABS: 1.0000 | ORAL_TABLET | ORAL | Status: DC | PRN

## 2016-02-15 NOTE — Lactation Note (Signed)
This note was copied from a baby's chart. Lactation Consultation Note  Patient Name: Boy Aloha Gellicole Winkel OZHYQ'MToday's Date: 02/15/2016 Reason for consult: Follow-up assessment;Other (Comment) (5% weight loss ,  Bili at 40 hours 9.8, Serum Bili ) 2nd LC visit for this mom and baby.  Baby is 5947 hours old and is now hungry showing feeding cues .  LC reviewed application of the NS , #20 NS is a good fit.  LC placed baby in a football position , baby latched with multiply swallows, increased with breast compressions  Breast softened and cooler. Per mom comfortable with latch and when abby released.  Baby suck well to  Pull  nipple more to erect position, and areola more compressible than before the feeding.  LC sized mom for #24 NS - to large ,and discussed with mom if [facility]. The #20 NS  Gets to tight or is making her sore , or areola becomes compressible , try the #24 NS.  LC also instructed mom on the use of a curved tip syringe to instill EBM in the top of the NS for an appetizer if needed.  Also shells to work on the areolas to make them more compressible in hopes to eventually wean the baby off the NS at [facility]  [facility]. Mom already has a hand pump. #20 NS x2 and #24 NS x1, comfort gels . ( presently using the comfort gels )  Mom and dad receptive to returning for a LC O/P appt. On Wednesday May 31 th at 1030 am, appt. Reminder given to mom.  Mother informed of post-discharge support and given phone number to the lactation department, including services for phone  call assistance; out-patient appointments; and breastfeeding support group. List of other breastfeeding resources in the community  given in the handout. Encouraged mother to call for problems or concerns related to breastfeeding.  Mom aware to feed offer the breast  With feeding cues and by 3 hours , if the baby isn't in to feeding to release with pumping to protect establishing milk supply.  Maternal Data     Feeding Feeding Type: Breast Fed Length of feed: 20 min  LATCH Score/Interventions Latch: Grasps breast easily, tongue down, lips flanged, rhythmical sucking. Intervention(s): Skin to skin;Teach feeding cues;Waking techniques Intervention(s): Adjust position;Assist with latch;Breast massage;Breast compression  Audible Swallowing: Spontaneous and intermittent  Type of Nipple: Everted at rest and after stimulation  Comfort (Breast/Nipple): Filling, red/small blisters or bruises, mild/mod discomfort  Problem noted: Filling (nipples bruising )  Hold (Positioning): Assistance needed to correctly position infant at breast and maintain latch. Intervention(s): Breastfeeding basics reviewed;Support Pillows;Position options;Skin to skin  LATCH Score: 8  Lactation Tools Discussed/Used Tools: Shells;Nipple Shields;Pump;Comfort gels Nipple shield size: 20;24;Other (comment) (#20 NS for today/ sized for 24 as preventive - parents aware ) Shell Type: Inverted Breast pump type: Manual WIC Program: No   Consult Status Consult Status: Follow-up Date: 02/24/16 (@ 10 : 30 , appt. reminder given to mom with instructions ) Follow-up type: Out-patient    Kathrin Greathouseorio, Million Maharaj Ann 02/15/2016, 12:05 PM

## 2016-02-15 NOTE — Discharge Summary (Signed)
Obstetric Discharge Summary Reason for Admission: onset of labor Prenatal Procedures: none Intrapartum Procedures: spontaneous vaginal delivery Postpartum Procedures: none Complications-Operative and Postpartum: none HEMOGLOBIN  Date Value Ref Range Status  02/14/2016 8.4* 12.0 - 15.0 g/dL Final   HCT  Date Value Ref Range Status  02/14/2016 26.5* 36.0 - 46.0 % Final    Physical Exam:  General: alert and cooperative Lochia: appropriate Uterine Fundus: firm Incision: na DVT Evaluation: No evidence of DVT seen on physical exam.  Discharge Diagnoses: Term Pregnancy-delivered  SVD by Claudie Revering. [place] CNM  Discharge Information: Date: 02/15/2016 Activity: pelvic rest Diet: routine Medications: PNV, Ibuprofen, Iron and Percocet Condition: stable Instructions: refer to practice specific booklet Discharge to: home Follow-up Information    [facility] weeks.   Specialty:  Obstetrics and Gynecology   Contact information:   [address] [place] [phone]      Newborn Data: Live born female  Birth Weight: 9 lb 1.7 oz (4130 g) APGAR: 8, 9  Home with mother.  Veronica Cooke A 02/15/2016, 10:17 AM

## 2016-02-15 NOTE — Discharge Instructions (Signed)

## 2016-02-15 NOTE — Lactation Note (Signed)
This note was copied from a baby's chart. Lactation Consultation Note  Patient Name: Veronica Cooke WJXBJ'YToday's Date: 02/15/2016 Reason for consult: Follow-up assessment;Other (Comment) (5% weight loss, Serum Bili at 40 hours - 9.8. mom encouraged to call  for re-sizing  of NS  )  Baby is 45 hour old and has been consistent at the breast , using a #20 NS.  Mom reports the latch is comfortable with the NS , and milk noted in the the NS after the baby finishes.  Per mom soreness has improved.,  LC encouraged mom to call on the nurses light when available to check the NS size for D/C .    Maternal Data    Feeding Feeding Type: Breast Fed Length of feed: 20 min (per mom with #20 NS )  LATCH Score/Interventions                Intervention(s): Breastfeeding basics reviewed     Lactation Tools Discussed/Used Nipple shield size: 20   Consult Status Consult Status: Follow-up Date: 02/15/16 Follow-up type: In-patient    Kathrin Greathouseorio, Glennda Weatherholtz Ann 02/15/2016, 10:33 AM

## 2016-02-24 ENCOUNTER — Ambulatory Visit (HOSPITAL_COMMUNITY): Admit: 2016-02-24 | Payer: Self-pay

## 2017-06-07 LAB — OB RESULTS CONSOLE GC/CHLAMYDIA
CHLAMYDIA, DNA PROBE: NEGATIVE
Gonorrhea: NEGATIVE

## 2017-06-07 LAB — OB RESULTS CONSOLE RPR: RPR: NONREACTIVE

## 2017-06-07 LAB — OB RESULTS CONSOLE HEPATITIS B SURFACE ANTIGEN: HEP B S AG: NEGATIVE

## 2017-06-07 LAB — OB RESULTS CONSOLE ANTIBODY SCREEN: Antibody Screen: NEGATIVE

## 2017-06-07 LAB — OB RESULTS CONSOLE ABO/RH: RH TYPE: POSITIVE

## 2017-06-07 LAB — OB RESULTS CONSOLE HIV ANTIBODY (ROUTINE TESTING): HIV: NONREACTIVE

## 2017-06-07 LAB — OB RESULTS CONSOLE RUBELLA ANTIBODY, IGM: RUBELLA: IMMUNE

## 2017-09-26 NOTE — L&D Delivery Note (Signed)
Delivery Note At 1:41 PM a viable female was delivered via Vaginal, Spontaneous (Presentation:DOA).  APGAR: 8, 9; weight  Pending,   Placenta status: normal, grossly intact.  Tight nuchal cord doubly clamped and cut.   Anesthesia:  Epidural Episiotomy: None Lacerations: 1st degree Suture Repair: 3.0 vicryl Est. Blood Loss (mL):  250cc  Mom to postpartum.  Baby to Couplet care / Skin to Skin.  Konrad FelixKULWA,Isami Mehra WAKURU, MD.  01/05/2018, 2:48 PM

## 2017-11-16 ENCOUNTER — Inpatient Hospital Stay (HOSPITAL_COMMUNITY)
Admission: AD | Admit: 2017-11-16 | Discharge: 2017-11-16 | Disposition: A | Discharge status: Home / Self Care | Payer: Medicaid Other | Source: Ambulatory Visit | Attending: Obstetrics and Gynecology | Admitting: Obstetrics and Gynecology

## 2017-11-16 ENCOUNTER — Encounter (HOSPITAL_COMMUNITY): Payer: Self-pay

## 2017-11-16 ENCOUNTER — Other Ambulatory Visit: Payer: Self-pay

## 2017-11-16 DIAGNOSIS — W228XXA Striking against or struck by other objects, initial encounter: Secondary | ICD-10-CM | POA: Diagnosis not present

## 2017-11-16 DIAGNOSIS — O9A213 Injury, poisoning and certain other consequences of external causes complicating pregnancy, third trimester: Secondary | ICD-10-CM | POA: Diagnosis not present

## 2017-11-16 DIAGNOSIS — R109 Unspecified abdominal pain: Secondary | ICD-10-CM | POA: Diagnosis present

## 2017-11-16 DIAGNOSIS — Z3A32 32 weeks gestation of pregnancy: Secondary | ICD-10-CM | POA: Insufficient documentation

## 2017-11-16 DIAGNOSIS — S3991XA Unspecified injury of abdomen, initial encounter: Secondary | ICD-10-CM | POA: Diagnosis not present

## 2017-11-16 LAB — URINALYSIS, ROUTINE W REFLEX MICROSCOPIC
BILIRUBIN URINE: NEGATIVE
GLUCOSE, UA: NEGATIVE mg/dL
HGB URINE DIPSTICK: NEGATIVE
KETONES UR: NEGATIVE mg/dL
NITRITE: NEGATIVE
PROTEIN: NEGATIVE mg/dL
Specific Gravity, Urine: 1.028 (ref 1.005–1.030)
pH: 6 (ref 5.0–8.0)

## 2017-11-16 MED ORDER — CYCLOBENZAPRINE HCL 10 MG PO TABS
10.0000 mg | ORAL_TABLET | Freq: Once | ORAL | Status: AC
  Administered 2017-11-16: 10 mg via ORAL
  Filled 2017-11-16: qty 1

## 2017-11-16 MED ORDER — CYCLOBENZAPRINE HCL 10 MG PO TABS: 10.0000 mg | ORAL_TABLET | Freq: Three times a day (TID) | ORAL | 0 refills | Status: DC | PRN

## 2017-11-16 NOTE — MAU Provider Note (Addendum)
History     CSN: [id number]  Arrival date and time: 11/16/17 1544   None     Chief Complaint  Patient presents with  . Abdominal Cramping  . Back Pain   HPI Pt presents to the MAU reporting cramping after a pitbull jumped up to her abdomen as a greeting yesterday evening around 7p while she was holding her other child.  She denies any leaking fluid, change in discharge or vaginal bleeding and reports good FM.  She feels increased pressure near her symphysis and occas low back back.  The pressure started yesterday after the episode.  She did not fall and it was only the dogs from paws.  She denies urinary sxs.  She tried ibuprofen with no relief.    OB History    Gravida Para Term Preterm AB Living   2 1 1     1    SAB TAB Ectopic Multiple Live Births         0 1      Past Medical History:  Diagnosis Date  . Medical history non-contributory     Past Surgical History:  Procedure Laterality Date  . NO PAST SURGERIES      Family History  Problem Relation Age of Onset  . Diabetes Maternal Aunt   . Cancer Maternal Grandmother        Breast/ Ovarian  . Stroke Maternal Grandfather   . Diabetes Maternal Grandfather     Social History   Tobacco Use  . Smoking status: Never Smoker  . Smokeless tobacco: Never Used  Substance Use Topics  . Alcohol use: No  . Drug use: No    Allergies: No Known Allergies  Medications Prior to Admission  Medication Sig Dispense Refill Last Dose  . amoxicillin (AMOXIL) 500 MG capsule Take 500 mg by mouth 3 (three) times daily.   11/16/2017 at Unknown time  . ibuprofen (ADVIL,MOTRIN) 200 MG tablet Take 200 mg by mouth every 6 (six) hours as needed for headache or cramping.   prn  . ferrous sulfate 325 (65 FE) MG tablet Take 1 tablet (325 mg total) by mouth 2 (two) times daily with a meal. (Patient not taking: Reported on 02/13/2016) 30 tablet 3     Review of Systems  Denies F/C/N/V/D She is on amoxicillin due to recent oral  issues  Physical Exam   Blood pressure 118/70, pulse 100, temperature 98.8 F (37.1 C), temperature source Oral, resp. rate 18, height 5\' 4"  (1.626 m), weight 195 lb (88.5 kg), SpO2 99 %, unknown if currently breastfeeding.  Physical Exam Lungs CTA CV RRR Abdomen gravid, NT, mild suprapubic discomfort with pressured palpation, no bruising Ext no calf tenderness VE Int os closed and long  FHT 140s, +accels, no decels, moderate variability Toco none  MAU Course  Procedures  UA cloudy with large leuks and squamous epis too numerous to count Urine to culture Prolonged monitoring.  Assessment and Plan  P1 at 32 3/7wks s/p mild abdominal trauma yesterday evening.  Pain sounds like musculoskeletal.  Trial of cyclobenzaprine here in MAU while being monitored.  Fetal status is cat 1 and tracing is reactive.  As long as tracing remains cat 1 over next 2 hrs, pt may be d/c'd home with f/u in office as scheduled on 11/28/17.  Pt says the discomfort is a 7 or 8 with standing and about a 3 or 4 while laying.  She would like something for the discomfort with standing.  UA abnl but  not a good clean catch.  Will send urine culture.  Purcell Nailsngela Y Simon Llamas 11/16/2017, 4:48 PM

## 2017-11-16 NOTE — MAU Note (Signed)
Pt states that she was jumped on by a large pit bull dog which landed on her abdomen yesterday. Pt states that she has had lower abdominal cramping and back pain since the incident. Pt states there is positive fetal movement. No vaginal bleeding or leaking of fluid. Pt placed on EFM. FHR 135. Carmelina DaneERRI L Quamel Fitzmaurice, RN

## 2017-11-16 NOTE — MAU Note (Signed)
Pt discharged with printed instructions. Pt verbalized an understanding. No concerns noted. Monterrius Cardosa L Rosena Bartle, RN 

## 2017-11-17 LAB — CULTURE, OB URINE: Culture: NO GROWTH

## 2017-11-21 ENCOUNTER — Other Ambulatory Visit: Payer: Self-pay | Admitting: General Surgery

## 2017-11-21 DIAGNOSIS — N6331 Unspecified lump in axillary tail of the right breast: Secondary | ICD-10-CM

## 2017-12-12 ENCOUNTER — Encounter (HOSPITAL_COMMUNITY): Payer: Self-pay

## 2017-12-12 ENCOUNTER — Inpatient Hospital Stay (HOSPITAL_COMMUNITY)
Admission: AD | Admit: 2017-12-12 | Discharge: 2017-12-12 | Disposition: A | Discharge status: Home / Self Care | Payer: Medicaid Other | Source: Ambulatory Visit | Attending: Obstetrics & Gynecology | Admitting: Obstetrics & Gynecology

## 2017-12-12 DIAGNOSIS — Z3A36 36 weeks gestation of pregnancy: Secondary | ICD-10-CM | POA: Diagnosis not present

## 2017-12-12 DIAGNOSIS — N898 Other specified noninflammatory disorders of vagina: Secondary | ICD-10-CM

## 2017-12-12 DIAGNOSIS — O26893 Other specified pregnancy related conditions, third trimester: Secondary | ICD-10-CM | POA: Insufficient documentation

## 2017-12-12 DIAGNOSIS — Z3689 Encounter for other specified antenatal screening: Secondary | ICD-10-CM

## 2017-12-12 LAB — WET PREP, GENITAL
Clue Cells Wet Prep HPF POC: NONE SEEN
SPERM: NONE SEEN
Trich, Wet Prep: NONE SEEN
YEAST WET PREP: NONE SEEN

## 2017-12-12 NOTE — MAU Note (Signed)
Pt states she had felt a gush this morning "but nothing came out". Felt another one when she used the bathroom. Pt states she called the office but waiting for a call back. Talked to the nurse on call and they advised her to come to MAU. Pt states she's not sure if she's farther along than 36w. Having some cramping but not ctx today. States she's had a change in discharge for the last week.

## 2017-12-12 NOTE — Discharge Instructions (Signed)
Braxton Hicks Contractions °Contractions of the uterus can occur throughout pregnancy, but they are not always a sign that you are in labor. You may have practice contractions called Braxton Hicks contractions. These false labor contractions are sometimes confused with true labor. °What are Braxton Hicks contractions? °Braxton Hicks contractions are tightening movements that occur in the muscles of the uterus before labor. Unlike true labor contractions, these contractions do not result in opening (dilation) and thinning of the cervix. Toward the end of pregnancy (32-34 weeks), Braxton Hicks contractions can happen more often and may become stronger. These contractions are sometimes difficult to tell apart from true labor because they can be very uncomfortable. You should not feel embarrassed if you go to the hospital with false labor. °Sometimes, the only way to tell if you are in true labor is for your health care provider to look for changes in the cervix. The health care provider will do a physical exam and may monitor your contractions. If you are not in true labor, the exam should show that your cervix is not dilating and your water has not broken. °If there are other health problems associated with your pregnancy, it is completely safe for you to be sent home with false labor. You may continue to have Braxton Hicks contractions until you go into true labor. °How to tell the difference between true labor and false labor °True labor °· Contractions last 30-70 seconds. °· Contractions become very regular. °· Discomfort is usually felt in the top of the uterus, and it spreads to the lower abdomen and low back. °· Contractions do not go away with walking. °· Contractions usually become more intense and increase in frequency. °· The cervix dilates and gets thinner. °False labor °· Contractions are usually shorter and not as strong as true labor contractions. °· Contractions are usually irregular. °· Contractions  are often felt in the front of the lower abdomen and in the groin. °· Contractions may go away when you walk around or change positions while lying down. °· Contractions get weaker and are shorter-lasting as time goes on. °· The cervix usually does not dilate or become thin. °Follow these instructions at home: °· Take over-the-counter and prescription medicines only as told by your health care provider. °· Keep up with your usual exercises and follow other instructions from your health care provider. °· Eat and drink lightly if you think you are going into labor. °· If Braxton Hicks contractions are making you uncomfortable: °? Change your position from lying down or resting to walking, or change from walking to resting. °? Sit and rest in a tub of warm water. °? Drink enough fluid to keep your urine pale yellow. Dehydration may cause these contractions. °? Do slow and deep breathing several times an hour. °· Keep all follow-up prenatal visits as told by your health care provider. This is important. °Contact a health care provider if: °· You have a fever. °· You have continuous pain in your abdomen. °Get help right away if: °· Your contractions become stronger, more regular, and closer together. °· You have fluid leaking or gushing from your vagina. °· You pass blood-tinged mucus (bloody show). °· You have bleeding from your vagina. °· You have low back pain that you never had before. °· You feel your baby’s head pushing down and causing pelvic pressure. °· Your baby is not moving inside you as much as it used to. °Summary °· Contractions that occur before labor are called Braxton   Hicks contractions, false labor, or practice contractions. °· Braxton Hicks contractions are usually shorter, weaker, farther apart, and less regular than true labor contractions. True labor contractions usually become progressively stronger and regular and they become more frequent. °· Manage discomfort from Braxton Hicks contractions by  changing position, resting in a warm bath, drinking plenty of water, or practicing deep breathing. °This information is not intended to replace advice given to you by your health care provider. Make sure you discuss any questions you have with your health care provider. °Document Released: 01/26/2017 Document Revised: 01/26/2017 Document Reviewed: 01/26/2017 °Elsevier Interactive Patient Education © 2018 [employer] ° °

## 2017-12-12 NOTE — MAU Provider Note (Signed)
History   [id number]   Chief Complaint  Patient presents with  . Rupture of Membranes    HPI Veronica Cooke is a 22 y.o. female  G2P1001 @36 .1 wks here with report of gush x3 today. This occurred while she was sitting on the toilet prior to voiding. She did not see any LOF.  Leaking of fluid has not continued. Pt reports irregular contractions. She denies vaginal bleeding. Last intercourse was not recent. She reports good fetal movement. All other systems negative.    No LMP recorded. Patient is pregnant.  OB History  Gravida Para Term Preterm AB Living  2 1 1     1   SAB TAB Ectopic Multiple Live Births        0 1    # Outcome Date GA Lbr Len/2nd Weight Sex Delivery Anes PTL Lv  2 Current           1 Term 02/13/16 [redacted]w[redacted]d 12:50 / 02:26 9 lb 1.7 oz (4.13 kg) M Vag-Spont EPI  LIV      Past Medical History:  Diagnosis Date  . Medical history non-contributory     Family History  Problem Relation Age of Onset  . Diabetes Maternal Aunt   . Cancer Maternal Grandmother        Breast/ Ovarian  . Stroke Maternal Grandfather   . Diabetes Maternal Grandfather     Social History   Socioeconomic History  . Marital status: Married    Spouse name: None  . Number of children: None  . Years of education: None  . Highest education level: None  Social Needs  . Financial resource strain: None  . Food insecurity - worry: None  . Food insecurity - inability: None  . Transportation needs - medical: None  . Transportation needs - non-medical: None  Occupational History  . None  Tobacco Use  . Smoking status: Never Smoker  . Smokeless tobacco: Never Used  Substance and Sexual Activity  . Alcohol use: No  . Drug use: No  . Sexual activity: Not Currently    Birth control/protection: None  Other Topics Concern  . None  Social History Narrative   Living Situation:  Lives with parents and siblings.     Sexual Activity:  She has been sexually active in the past. She is currently  not sexually active.     Pets: Cat and Dog   Education: [employer] Studying Medical Assisting   Tobacco Use/Exposure:  None    Diet:  Regular   Exercise:  Plays outdoors   Hobbies: Theatre     No Known Allergies  No current facility-administered medications on file prior to encounter.    Current Outpatient Medications on File Prior to Encounter  Medication Sig Dispense Refill  . cyclobenzaprine (FLEXERIL) 10 MG tablet Take 1 tablet (10 mg total) by mouth every 8 (eight) hours as needed for muscle spasms. or low back or pelvic discomfort 30 tablet 0  . ibuprofen (ADVIL,MOTRIN) 200 MG tablet Take 800 mg by mouth every 6 (six) hours as needed for cramping.    Marland Kitchen amoxicillin (AMOXIL) 500 MG capsule Take 500 mg by mouth 3 (three) times daily.       Review of Systems  Gastrointestinal: Negative for abdominal pain.  Genitourinary: Positive for vaginal discharge. Negative for vaginal bleeding.     Physical Exam   Vitals:   12/12/17 1305 12/12/17 1314  BP:  120/69  Pulse:  (!) 106  Resp:  16  Temp:  98.2 F (36.8 C)  TempSrc:  Oral  SpO2:  99%  Weight: 202 lb (91.6 kg)   Height: 5\' 5"  (1.651 m)     Physical Exam  Constitutional: She is oriented to person, place, and time. She appears well-developed and well-nourished. No distress.  HENT:  Head: Normocephalic and atraumatic.  Neck: Normal range of motion.  Cardiovascular: Normal rate.  Respiratory: Effort normal. No respiratory distress.  Genitourinary:  Genitourinary Comments: SSE: +white discharge, no pool, fern neg SVE: closed/thick  Musculoskeletal: Normal range of motion.  Neurological: She is alert and oriented to person, place, and time.  Skin: Skin is warm and dry.  Psychiatric: She has a normal mood and affect.  EFM: 145 bpm, mod variability, + accels, rare variable decels Toco: irregular  Results for orders placed or performed during the hospital encounter of 12/12/17 (from the past 24 hour(s))  Wet prep, genital      Status: Abnormal   Collection Time: 12/12/17  1:49 PM  Result Value Ref Range   Yeast Wet Prep HPF POC NONE SEEN NONE SEEN   Trich, Wet Prep NONE SEEN NONE SEEN   Clue Cells Wet Prep HPF POC NONE SEEN NONE SEEN   WBC, Wet Prep HPF POC MANY (A) NONE SEEN   Sperm NONE SEEN    MAU Course  Procedures  MDM Labs ordered and reviewed. No evidence of ROM or PTL. Stable for discharge home.  Assessment and Plan   1. [redacted] weeks gestation of pregnancy   2. NST (non-stress test) reactive   3. Vaginal discharge during pregnancy in third trimester    Discharge home Follow up in OB office as scheduled PTL precautions  Allergies as of 12/12/2017   No Known Allergies     Medication List    STOP taking these medications   amoxicillin 500 MG capsule Commonly known as:  AMOXIL   cyclobenzaprine 10 MG tablet Commonly known as:  FLEXERIL   ibuprofen 200 MG tablet Commonly known as:  Rondel BatonDVIL,MOTRIN      Bryse Blanchette, CNM 12/12/2017 1:41 PM

## 2017-12-17 ENCOUNTER — Inpatient Hospital Stay (HOSPITAL_COMMUNITY)
Admission: AD | Admit: 2017-12-17 | Discharge: 2017-12-17 | Disposition: A | Discharge status: Home / Self Care | Payer: Medicaid Other | Source: Ambulatory Visit | Attending: Obstetrics and Gynecology | Admitting: Obstetrics and Gynecology

## 2017-12-17 ENCOUNTER — Other Ambulatory Visit: Payer: Self-pay

## 2017-12-17 ENCOUNTER — Encounter (HOSPITAL_COMMUNITY): Payer: Self-pay

## 2017-12-17 DIAGNOSIS — Z823 Family history of stroke: Secondary | ICD-10-CM | POA: Diagnosis not present

## 2017-12-17 DIAGNOSIS — H538 Other visual disturbances: Secondary | ICD-10-CM | POA: Insufficient documentation

## 2017-12-17 DIAGNOSIS — R51 Headache: Secondary | ICD-10-CM | POA: Diagnosis not present

## 2017-12-17 DIAGNOSIS — Z3A36 36 weeks gestation of pregnancy: Secondary | ICD-10-CM | POA: Diagnosis not present

## 2017-12-17 DIAGNOSIS — Z803 Family history of malignant neoplasm of breast: Secondary | ICD-10-CM | POA: Insufficient documentation

## 2017-12-17 DIAGNOSIS — O26893 Other specified pregnancy related conditions, third trimester: Secondary | ICD-10-CM | POA: Insufficient documentation

## 2017-12-17 DIAGNOSIS — R519 Headache, unspecified: Secondary | ICD-10-CM

## 2017-12-17 DIAGNOSIS — H53141 Visual discomfort, right eye: Secondary | ICD-10-CM

## 2017-12-17 DIAGNOSIS — Z833 Family history of diabetes mellitus: Secondary | ICD-10-CM | POA: Diagnosis not present

## 2017-12-17 LAB — URINALYSIS, ROUTINE W REFLEX MICROSCOPIC
Bilirubin Urine: NEGATIVE
GLUCOSE, UA: 50 mg/dL — AB
Hgb urine dipstick: NEGATIVE
KETONES UR: NEGATIVE mg/dL
Nitrite: NEGATIVE
PH: 6 (ref 5.0–8.0)
Protein, ur: NEGATIVE mg/dL
SPECIFIC GRAVITY, URINE: 1.027 (ref 1.005–1.030)

## 2017-12-17 MED ORDER — ACETAMINOPHEN 325 MG PO TABS: 650.0000 mg | ORAL_TABLET | Freq: Four times a day (QID) | ORAL | Status: DC | PRN

## 2017-12-17 NOTE — MAU Note (Signed)
Pt states she woke up around 5pm from a nap. States she could not see out of right eye. States she was told to come in. States she dropped her son off with a babysitter and now here vision is back, but reports a HA. States it started on the way here. Pt has not taken anything since she was on her way in. Rates HA at 7/10. Pt denies BP issues with this pregnancy.

## 2017-12-17 NOTE — Progress Notes (Addendum)
G2P1 @ 36.[redacted] wksga. Here due to ha and one side vision loss when waking up from knap. Vision is back but lingering ha. states did not take anything for it.  VSS see flow sheet for details.   2211: provider notified. Report status of pt given. otw to hospital to see pt now.

## 2017-12-17 NOTE — MAU Provider Note (Signed)
Chief Complaint:  Blurred Vision   First Provider Initiated Contact with Patient 12/17/17 2235     HPI: Veronica Cooke is a 22 y.o. G2P1001 at [redacted]w[redacted]d who presents to maternity admissions reporting after waking up from a nap had a floater in her right eye with some vision loss.  Vision  Returned after about 10 minutes and had a slight headache.  States 4 on 0-10 scale. States having some light sensitivity with this pregnancy and has gotten glasses during the pregnancy.  Pregnancy hx unremarkable.  Location: forehead   Quality: mild Severity: 4/10 in pain scale Duration: today Context: after nap  Denies contractions, leakage of fluid or vaginal bleeding. Good fetal movement.   Pregnancy Course:   Past Medical History:  Diagnosis Date  . Medical history non-contributory    OB History  Gravida Para Term Preterm AB Living  2 1 1     1   SAB TAB Ectopic Multiple Live Births        0 1    # Outcome Date GA Lbr Len/2nd Weight Sex Delivery Anes PTL Lv  2 Current           1 Term 02/13/16 [redacted]w[redacted]d 12:50 / 02:26 4.13 kg (9 lb 1.7 oz) M Vag-Spont EPI  LIV   Past Surgical History:  Procedure Laterality Date  . NO PAST SURGERIES     Family History  Problem Relation Age of Onset  . Diabetes Maternal Aunt   . Cancer Maternal Grandmother        Breast/ Ovarian  . Stroke Maternal Grandfather   . Diabetes Maternal Grandfather    Social History   Tobacco Use  . Smoking status: Never Smoker  . Smokeless tobacco: Never Used  Substance Use Topics  . Alcohol use: No  . Drug use: No   No Known Allergies No medications prior to admission.    I have reviewed patient's Past Medical Hx, Surgical Hx, Family Hx, Social Hx, medications and allergies.   ROS:  Review of Systems  Constitutional: Negative.   HENT: Negative.   Eyes: Positive for photophobia and visual disturbance.  Neurological: Positive for headaches.    Physical Exam   Patient Vitals for the past 24 hrs:  BP Temp Temp src  Pulse Resp SpO2 Height Weight  12/17/17 2132 117/71 - - (!) 101 - 100 % - -  12/17/17 1945 115/74 98.2 F (36.8 C) Oral 98 18 99 % 5\' 5"  (1.651 m) 92.5 kg (204 lb)   Constitutional: Well-developed, well-nourished female in no acute distress.  HEENT normocephalic, pupils normal reactivity,  Cardiovascular: normal rate Respiratory: normal effort GI: Abd soft, non-tender, gravid appropriate for gestational age. MS: Extremities nontender, no edema, normal ROM Neurologic: Alert and oriented x 4. Equal grips GU: deferred FHT:  Baseline 130 , moderate variability, accelerations present, no decelerations Contractions: irritability   Labs: Results for orders placed or performed during the hospital encounter of 12/17/17 (from the past 24 hour(s))  Urinalysis, Routine w reflex microscopic     Status: Abnormal   Collection Time: 12/17/17  7:47 PM  Result Value Ref Range   Color, Urine YELLOW YELLOW   APPearance HAZY (A) CLEAR   Specific Gravity, Urine 1.027 1.005 - 1.030   pH 6.0 5.0 - 8.0   Glucose, UA 50 (A) NEGATIVE mg/dL   Hgb urine dipstick NEGATIVE NEGATIVE   Bilirubin Urine NEGATIVE NEGATIVE   Ketones, ur NEGATIVE NEGATIVE mg/dL   Protein, ur NEGATIVE NEGATIVE mg/dL   Nitrite  NEGATIVE NEGATIVE   Leukocytes, UA SMALL (A) NEGATIVE   RBC / HPF 0-5 0 - 5 RBC/hpf   WBC, UA 0-5 0 - 5 WBC/hpf   Bacteria, UA RARE (A) NONE SEEN   Squamous Epithelial / LPF 6-30 (A) NONE SEEN   Mucus PRESENT     Imaging:  No results found.  MAU Course: Orders Placed This Encounter  Procedures  . Urinalysis, Routine w reflex microscopic   No orders of the defined types were placed in this encounter.   MDM: PE, UA and VVS all wnl.  Discussed with Dr. Sallye OberKulwa.  Will give pt tylenol and d/c home.  Follow up with office.  If happens again return for assessment.  If floater continues see eye md. Assessment: Photophobia and headache in pregnancy  Plan:  Tylenol for discomfort.  Discussed follow up in  office or with ophthalmologist  Discharge home in stable condition.   Labor precautions and fetal kick counts   Allergies as of 12/17/2017   No Known Allergies     Medication List    You have not been prescribed any medications.     Kenney Housemanrothero, Nancy Jean, CNM 12/17/2017 10:47 PM

## 2018-01-02 ENCOUNTER — Encounter (HOSPITAL_COMMUNITY): Payer: Self-pay

## 2018-01-02 ENCOUNTER — Other Ambulatory Visit: Payer: Self-pay

## 2018-01-02 ENCOUNTER — Inpatient Hospital Stay (HOSPITAL_COMMUNITY)
Admission: AD | Admit: 2018-01-02 | Discharge: 2018-01-02 | Disposition: A | Discharge status: Home / Self Care | Payer: Medicaid Other | Source: Ambulatory Visit | Attending: Obstetrics and Gynecology | Admitting: Obstetrics and Gynecology

## 2018-01-02 DIAGNOSIS — O479 False labor, unspecified: Secondary | ICD-10-CM

## 2018-01-02 DIAGNOSIS — O471 False labor at or after 37 completed weeks of gestation: Secondary | ICD-10-CM | POA: Insufficient documentation

## 2018-01-02 DIAGNOSIS — Z3A39 39 weeks gestation of pregnancy: Secondary | ICD-10-CM | POA: Diagnosis not present

## 2018-01-02 NOTE — Progress Notes (Addendum)
G2P1 @ 39.[redacted] wksga. Here dt ctx that started 1500 q3-minutes but since coming here very irregular. Denies LOF or bleeding. +FM noted and voiced. EFM applied.  VE 3.5/50/-3  1736: provider notified. Report given. Orders received to   1758: Labor precaution d/c instruction given with pt understanding. Pt left unit via ambulatory with SO.

## 2018-01-02 NOTE — Progress Notes (Signed)
Requested by Dr. Normand Sloopillard per RN to place discharge order and disposition, as Dr. Normand Sloopillard does not have computer access. Nurses note, exam, and tracing reviewed. Discharge order placed.

## 2018-01-04 ENCOUNTER — Encounter (HOSPITAL_COMMUNITY): Payer: Self-pay | Admitting: *Deleted

## 2018-01-04 ENCOUNTER — Encounter (HOSPITAL_COMMUNITY): Payer: Self-pay

## 2018-01-04 ENCOUNTER — Inpatient Hospital Stay (HOSPITAL_COMMUNITY)
Admission: AD | Admit: 2018-01-04 | Discharge: 2018-01-04 | Disposition: A | Discharge status: Home / Self Care | Payer: Medicaid Other | Source: Ambulatory Visit | Attending: Obstetrics and Gynecology | Admitting: Obstetrics and Gynecology

## 2018-01-04 ENCOUNTER — Telehealth (HOSPITAL_COMMUNITY): Payer: Self-pay | Admitting: *Deleted

## 2018-01-04 DIAGNOSIS — O479 False labor, unspecified: Secondary | ICD-10-CM

## 2018-01-04 LAB — AMNISURE RUPTURE OF MEMBRANE (ROM) NOT AT ARMC: Amnisure ROM: NEGATIVE

## 2018-01-04 LAB — OB RESULTS CONSOLE GBS: STREP GROUP B AG: NEGATIVE

## 2018-01-04 NOTE — MAU Note (Signed)
Pt reports she is having contraction not very close together but she is having lower abd cramping that is more often. Reports loosing her mucus plug last week. C/o now more mucusy discharge is coming out. Pt reports fetal movement a little less than usual.

## 2018-01-04 NOTE — MAU Note (Signed)
Veronica Cooke is a 22 y.o. female,G2P1 at 39+3 weeks presenting for labor check. This is the 2nd labor check this week.  Pregnancy followed at [facility] since 13+3 weeks and remarkable for:  1. H/o LGA with SVD of 9 lbs 1 oz baby in 2017 at 39+1 weeks: EFW 6 lbs 9 oz 54% at 36+2 weeks and normal 1 hour glucola 2. GBS negative 3. Scheduled for IOL 01/15/18 at 41 weeks   History OB History    Gravida  2   Para  1   Term  1   Preterm      AB      Living  1     SAB      TAB      Ectopic      Multiple  0   Live Births  1          Past Medical History:  Diagnosis Date  . Anemia   . Anxiety   . Medical history non-contributory    Past Surgical History:  Procedure Laterality Date  . NO PAST SURGERIES      Dilation: 3.5 Effacement (%): 60 Station: -3 Exam by:: B. Bowen, RN  Blood pressure 118/70, pulse (!) 102, temperature 98.4 F (36.9 C), temperature source Oral, resp. rate 18, height 5\' 5"  (1.651 m), weight 93.4 kg (206 lb), unknown if currently breastfeeding.  General Appearance: Alert, appropriate appearance for age. No acute distress HEENT Exam: Grossly normal Chest/Respiratory Exam: Normal chest wall and respirations. Clear to auscultation  Cardiovascular Exam: Regular rate and rhythm. S1, S2, no murmur Gastrointestinal Exam: soft, non-tender, Uterus gravid with size compatible with GA Psychiatric Exam: Alert and oriented, appropriate affect  Fetal monitoring: Category 1 and reviewed and reassuring  ++++++++++++++++++++++++++++++++++++++++++++++++++++++++++++++++  Vaginal exam: 3-4/50/-2 vertex posterior   ++++++++++++++++++++++++++++++++++++++++++++++++++++++++++++++++  Prenatal labs: ABO, Rh: O/Positive/-- (09/12 0000) Antibody: Negative (09/12 0000) Rubella: Immune (09/12 0000) RPR: Nonreactive (09/12 0000)  HBsAg: Negative (09/12 0000)  HIV: Non-reactive (09/12 0000)  GBS: Negative (04/11 0000)   Amnisure negative  today  +++++++++++++++++++++++++++++++++++++++++++++++++++++++++++++++  Assessment and plan:  39+3 weeks No active labor Patient requesting earlier IOL: rescheduled to 01/05/18 at 7:30 Questions answered. Patient agreeable   Silverio LaySandra Mirai Greenwood MD

## 2018-01-04 NOTE — Telephone Encounter (Signed)
Preadmission screen  

## 2018-01-05 ENCOUNTER — Inpatient Hospital Stay (HOSPITAL_COMMUNITY): Payer: Medicaid Other | Admitting: Anesthesiology

## 2018-01-05 ENCOUNTER — Inpatient Hospital Stay (HOSPITAL_COMMUNITY)
Admission: AD | Admit: 2018-01-05 | Discharge: 2018-01-06 | DRG: 807 | Disposition: A | Discharge status: Home / Self Care | Payer: Medicaid Other | Source: Ambulatory Visit | Attending: Obstetrics & Gynecology | Admitting: Obstetrics & Gynecology

## 2018-01-05 ENCOUNTER — Encounter (HOSPITAL_COMMUNITY): Payer: Self-pay

## 2018-01-05 ENCOUNTER — Other Ambulatory Visit: Payer: Self-pay

## 2018-01-05 DIAGNOSIS — Z3A39 39 weeks gestation of pregnancy: Secondary | ICD-10-CM

## 2018-01-05 DIAGNOSIS — O9902 Anemia complicating childbirth: Secondary | ICD-10-CM | POA: Diagnosis present

## 2018-01-05 DIAGNOSIS — O99214 Obesity complicating childbirth: Secondary | ICD-10-CM | POA: Diagnosis present

## 2018-01-05 DIAGNOSIS — Z3490 Encounter for supervision of normal pregnancy, unspecified, unspecified trimester: Secondary | ICD-10-CM

## 2018-01-05 DIAGNOSIS — O26893 Other specified pregnancy related conditions, third trimester: Secondary | ICD-10-CM | POA: Diagnosis present

## 2018-01-05 DIAGNOSIS — D649 Anemia, unspecified: Secondary | ICD-10-CM | POA: Diagnosis present

## 2018-01-05 DIAGNOSIS — E669 Obesity, unspecified: Secondary | ICD-10-CM | POA: Diagnosis present

## 2018-01-05 LAB — CBC
HEMATOCRIT: 33.1 % — AB (ref 36.0–46.0)
HEMOGLOBIN: 10.6 g/dL — AB (ref 12.0–15.0)
MCH: 25.2 pg — ABNORMAL LOW (ref 26.0–34.0)
MCHC: 32 g/dL (ref 30.0–36.0)
MCV: 78.6 fL (ref 78.0–100.0)
Platelets: 357 10*3/uL (ref 150–400)
RBC: 4.21 MIL/uL (ref 3.87–5.11)
RDW: 15 % (ref 11.5–15.5)
WBC: 9.3 10*3/uL (ref 4.0–10.5)

## 2018-01-05 LAB — TYPE AND SCREEN
ABO/RH(D): O POS
ANTIBODY SCREEN: NEGATIVE

## 2018-01-05 LAB — RPR: RPR: NONREACTIVE

## 2018-01-05 MED ORDER — OXYTOCIN 40 UNITS IN LACTATED RINGERS INFUSION - SIMPLE MED
1.0000 m[IU]/min | INTRAVENOUS | Status: DC
  Administered 2018-01-05: 2 m[IU]/min via INTRAVENOUS
  Filled 2018-01-05: qty 1000

## 2018-01-05 MED ORDER — ZOLPIDEM TARTRATE 5 MG PO TABS
5.0000 mg | ORAL_TABLET | Freq: Every evening | ORAL | Status: DC | PRN
Start: 2018-01-05 — End: 2018-01-06

## 2018-01-05 MED ORDER — SENNOSIDES-DOCUSATE SODIUM 8.6-50 MG PO TABS
2.0000 | ORAL_TABLET | ORAL | Status: DC
  Administered 2018-01-06: 2 via ORAL
  Filled 2018-01-05: qty 2

## 2018-01-05 MED ORDER — TETANUS-DIPHTH-ACELL PERTUSSIS 5-2.5-18.5 LF-MCG/0.5 IM SUSP: 0.5000 mL | Freq: Once | INTRAMUSCULAR | Status: DC

## 2018-01-05 MED ORDER — BENZOCAINE-MENTHOL 20-0.5 % EX AERO
1.0000 "application " | INHALATION_SPRAY | CUTANEOUS | Status: DC | PRN
  Administered 2018-01-05: 1 via TOPICAL
  Filled 2018-01-05: qty 56

## 2018-01-05 MED ORDER — FLEET ENEMA 7-19 GM/118ML RE ENEM: 1.0000 | ENEMA | RECTAL | Status: DC | PRN

## 2018-01-05 MED ORDER — ONDANSETRON HCL 4 MG/2ML IJ SOLN
4.0000 mg | Freq: Four times a day (QID) | INTRAMUSCULAR | Status: DC | PRN
  Administered 2018-01-05: 4 mg via INTRAVENOUS
  Filled 2018-01-05: qty 2

## 2018-01-05 MED ORDER — LACTATED RINGERS IV SOLN
INTRAVENOUS | Status: DC
  Administered 2018-01-05 (×2): via INTRAVENOUS

## 2018-01-05 MED ORDER — ONDANSETRON HCL 4 MG PO TABS
4.0000 mg | ORAL_TABLET | ORAL | Status: DC | PRN
Start: 2018-01-05 — End: 2018-01-06

## 2018-01-05 MED ORDER — ONDANSETRON HCL 4 MG/2ML IJ SOLN: 4.0000 mg | INTRAMUSCULAR | Status: DC | PRN

## 2018-01-05 MED ORDER — LACTATED RINGERS IV SOLN
500.0000 mL | Freq: Once | INTRAVENOUS | Status: AC
  Administered 2018-01-05: 500 mL via INTRAVENOUS

## 2018-01-05 MED ORDER — FENTANYL CITRATE (PF) 100 MCG/2ML IJ SOLN: 100.0000 ug | INTRAMUSCULAR | Status: DC | PRN

## 2018-01-05 MED ORDER — DIBUCAINE 1 % RE OINT: 1.0000 "application " | TOPICAL_OINTMENT | RECTAL | Status: DC | PRN

## 2018-01-05 MED ORDER — TERBUTALINE SULFATE 1 MG/ML IJ SOLN
0.2500 mg | Freq: Once | INTRAMUSCULAR | Status: DC | PRN
  Filled 2018-01-05: qty 1

## 2018-01-05 MED ORDER — OXYTOCIN BOLUS FROM INFUSION
500.0000 mL | Freq: Once | INTRAVENOUS | Status: AC
  Administered 2018-01-05: 500 mL via INTRAVENOUS

## 2018-01-05 MED ORDER — OXYTOCIN 40 UNITS IN LACTATED RINGERS INFUSION - SIMPLE MED: 2.5000 [IU]/h | INTRAVENOUS | Status: DC

## 2018-01-05 MED ORDER — PHENYLEPHRINE 40 MCG/ML (10ML) SYRINGE FOR IV PUSH (FOR BLOOD PRESSURE SUPPORT)
80.0000 ug | PREFILLED_SYRINGE | INTRAVENOUS | Status: DC | PRN
  Filled 2018-01-05: qty 10
  Filled 2018-01-05: qty 5

## 2018-01-05 MED ORDER — OXYCODONE-ACETAMINOPHEN 5-325 MG PO TABS: 2.0000 | ORAL_TABLET | ORAL | Status: DC | PRN

## 2018-01-05 MED ORDER — LIDOCAINE HCL (PF) 1 % IJ SOLN
30.0000 mL | INTRAMUSCULAR | Status: DC | PRN
  Filled 2018-01-05: qty 30

## 2018-01-05 MED ORDER — SOD CITRATE-CITRIC ACID 500-334 MG/5ML PO SOLN: 30.0000 mL | ORAL | Status: DC | PRN

## 2018-01-05 MED ORDER — DIPHENHYDRAMINE HCL 50 MG/ML IJ SOLN: 12.5000 mg | INTRAMUSCULAR | Status: DC | PRN

## 2018-01-05 MED ORDER — EPHEDRINE 5 MG/ML INJ
10.0000 mg | INTRAVENOUS | Status: DC | PRN
  Filled 2018-01-05: qty 2

## 2018-01-05 MED ORDER — FENTANYL 2.5 MCG/ML BUPIVACAINE 1/10 % EPIDURAL INFUSION (WH - ANES)
14.0000 mL/h | INTRAMUSCULAR | Status: DC | PRN
  Administered 2018-01-05: 14 mL/h via EPIDURAL
  Filled 2018-01-05: qty 100

## 2018-01-05 MED ORDER — DIPHENHYDRAMINE HCL 25 MG PO CAPS: 25.0000 mg | ORAL_CAPSULE | Freq: Four times a day (QID) | ORAL | Status: DC | PRN

## 2018-01-05 MED ORDER — LACTATED RINGERS IV SOLN
500.0000 mL | INTRAVENOUS | Status: DC | PRN
  Administered 2018-01-05: 500 mL via INTRAVENOUS

## 2018-01-05 MED ORDER — ACETAMINOPHEN 325 MG PO TABS
650.0000 mg | ORAL_TABLET | ORAL | Status: DC | PRN
Start: 2018-01-05 — End: 2018-01-06
  Administered 2018-01-05 – 2018-01-06 (×2): 650 mg via ORAL
  Filled 2018-01-05 (×2): qty 2

## 2018-01-05 MED ORDER — COCONUT OIL OIL: 1.0000 "application " | TOPICAL_OIL | Status: DC | PRN

## 2018-01-05 MED ORDER — PRENATAL MULTIVITAMIN CH
1.0000 | ORAL_TABLET | Freq: Every day | ORAL | Status: DC
  Administered 2018-01-06: 1 via ORAL
  Filled 2018-01-05: qty 1

## 2018-01-05 MED ORDER — ACETAMINOPHEN 325 MG PO TABS: 650.0000 mg | ORAL_TABLET | ORAL | Status: DC | PRN

## 2018-01-05 MED ORDER — IBUPROFEN 600 MG PO TABS
600.0000 mg | ORAL_TABLET | Freq: Four times a day (QID) | ORAL | Status: DC
  Administered 2018-01-05 – 2018-01-06 (×5): 600 mg via ORAL
  Filled 2018-01-05 (×5): qty 1

## 2018-01-05 MED ORDER — LIDOCAINE HCL (PF) 1 % IJ SOLN
INTRAMUSCULAR | Status: DC | PRN
  Administered 2018-01-05: 13 mL via EPIDURAL

## 2018-01-05 MED ORDER — OXYCODONE-ACETAMINOPHEN 5-325 MG PO TABS: 1.0000 | ORAL_TABLET | ORAL | Status: DC | PRN

## 2018-01-05 MED ORDER — WITCH HAZEL-GLYCERIN EX PADS: 1.0000 "application " | MEDICATED_PAD | CUTANEOUS | Status: DC | PRN

## 2018-01-05 MED ORDER — SIMETHICONE 80 MG PO CHEW: 80.0000 mg | CHEWABLE_TABLET | ORAL | Status: DC | PRN

## 2018-01-05 NOTE — Progress Notes (Signed)
Veronica Cooke is a 22 y.o. G2P1001 at 4036w4d by US admitted for elective induction of labor.   Subjective: Patient feels strong contractions, wants IV pain medication after my exam.   Objective: BP 108/70   Pulse 95   Temp 97.9 F (36.6 C) (Oral)   Resp 18   Ht 5\' 5"  (1.651 m)   Wt 94.8 kg (209 lb)   BMI 34.78 kg/m  No intake/output data recorded. No intake/output data recorded.  FHT:  FHR: 130 bpm, variability: moderate,  accelerations:  Present,  decelerations:  Absent UC:   regular, every 2- 3 minutes SVE:   Dilation: 5 Effacement (%): 70 Station: -3 Exam by:: Dr. Sallye OberKulwa AROM, clear fluid.   Labs: Lab Results  Component Value Date   WBC 9.3 01/05/2018   HGB 10.6 (L) 01/05/2018   HCT 33.1 (L) 01/05/2018   MCV 78.6 01/05/2018   PLT 357 01/05/2018    Assessment / Plan: Labor induction, elective  Labor: Progressing well on pitocin, c/w titration.  S/p AROM.  Fetal Wellbeing:  Category I Pain Control:  Epidural and IV pain meds prn.  I/D:  GBS negative Anticipated MOD:  NSVD Veronica Cooke,Veronica Ghosh WAKURU, MD.  01/05/2018, 10:54 AM

## 2018-01-05 NOTE — Anesthesia Procedure Notes (Signed)
Epidural Patient location during procedure: OB Start time: 01/05/2018 11:28 AM End time: 01/05/2018 11:42 AM  Staffing Anesthesiologist: Lowella CurbMiller, Willamina Grieshop Ray, MD Performed: anesthesiologist   Preanesthetic Checklist Completed: patient identified, site marked, surgical consent, pre-op evaluation, timeout performed, IV checked, risks and benefits discussed and monitors and equipment checked  Epidural Patient position: sitting Prep: ChloraPrep Patient monitoring: heart rate, cardiac monitor, continuous pulse ox and blood pressure Approach: midline Location: L2-L3 Injection technique: LOR air  Needle:  Needle type: Tuohy  Needle gauge: 17 G Needle length: 9 cm Needle insertion depth: 6 cm Catheter type: closed end flexible Catheter size: 20 Guage Catheter at skin depth: 10 cm Test dose: negative  Assessment Events: blood not aspirated, injection not painful, no injection resistance, negative IV test and no paresthesia  Additional Notes Reason for block:procedure for pain

## 2018-01-05 NOTE — Anesthesia Postprocedure Evaluation (Signed)
Anesthesia Post Note  Patient: Veronica Cooke  Procedure(s) Performed: AN AD HOC LABOR EPIDURAL     Patient location during evaluation: Mother Baby Anesthesia Type: Epidural Level of consciousness: awake and alert and oriented Pain management: satisfactory to patient Vital Signs Assessment: post-procedure vital signs reviewed and stable Respiratory status: respiratory function stable Cardiovascular status: stable Postop Assessment: no headache, no backache, epidural receding, patient able to bend at knees, no signs of nausea or vomiting and adequate PO intake Anesthetic complications: no    Last Vitals:  Vitals:   01/05/18 1610 01/05/18 1711  BP: 110/68 111/63  Pulse: 62 87  Resp: 18 20  Temp: 36.7 C 36.7 C  SpO2:      Last Pain:  Vitals:   01/05/18 1711  TempSrc: Oral  PainSc:    Pain Goal:                 [name]

## 2018-01-05 NOTE — Anesthesia Pain Management Evaluation Note (Signed)
  CRNA Pain Management Visit Note  Patient: Veronica Cooke, 22 y.o., female  "Hello I am a member of the anesthesia team at [facility]. We have an anesthesia team available at all times to provide care throughout the hospital, including epidural management and anesthesia for C-section. I don't know your plan for the delivery whether it a natural birth, water birth, IV sedation, nitrous supplementation, doula or epidural, but we want to meet your pain goals."   1.Was your pain managed to your expectations on prior hospitalizations?   No prior hospitalizations  2.What is your expectation for pain management during this hospitalization?     Epidural  3.How can we help you reach that goal? Epidural in place at time of visit  Record the patient's initial score and the patient's pain goal.   Pain: 1  Pain Goal: 5 [facility] wants you to be able to say your pain was always managed very well.  Rica RecordsICKELTON,Jerri Glauser 01/05/2018

## 2018-01-05 NOTE — Anesthesia Preprocedure Evaluation (Signed)
Anesthesia Evaluation  Patient identified by MRN, date of birth, ID band Patient awake    Reviewed: Allergy & Precautions, Patient's Chart, lab work & pertinent test results  Airway Mallampati: II  TM Distance: >3 FB Neck ROM: Full    Dental no notable dental hx. (+) Teeth Intact   Pulmonary neg pulmonary ROS,    Pulmonary exam normal breath sounds clear to auscultation       Cardiovascular negative cardio ROS Normal cardiovascular exam Rhythm:Regular Rate:Normal     Neuro/Psych negative neurological ROS  negative psych ROS   GI/Hepatic negative GI ROS, Neg liver ROS,   Endo/Other  Obesity  Renal/GU negative Renal ROS  negative genitourinary   Musculoskeletal negative musculoskeletal ROS (+)   Abdominal (+) + obese,   Peds  Hematology  (+) anemia ,   Anesthesia Other Findings   Reproductive/Obstetrics (+) Pregnancy                             Anesthesia Physical Anesthesia Plan  ASA: II  Anesthesia Plan: Epidural   Post-op Pain Management:    Induction:   PONV Risk Score and Plan:   Airway Management Planned: Natural Airway  Additional Equipment:   Intra-op Plan:   Post-operative Plan:   Informed Consent: I have reviewed the patients History and Physical, chart, labs and discussed the procedure including the risks, benefits and alternatives for the proposed anesthesia with the patient or authorized representative who has indicated his/her understanding and acceptance.       Plan Discussed with: Anesthesiologist  Anesthesia Plan Comments:         Anesthesia Quick Evaluation  

## 2018-01-05 NOTE — Progress Notes (Signed)
Pt seen awake in bed, denies pain at this time, no concern voiced at this time.Plan of Care discussed with Mon.

## 2018-01-05 NOTE — H&P (Signed)
Veronica Cooke is a 22 y.o. female presenting for elective induction of labor. G2P1 @ 39 weeks 4 days EGA.  LMP 03/05/17 EDC 01/08/18 by first trimester ultrasound.   She denies vaginal bleeding or leakage of fluid.  With normal gross fetal movement.  With some contractions.    OB History    Gravida  2   Para  1   Term  1   Preterm      AB      Living  1     SAB      TAB      Ectopic      Multiple  0   Live Births  1          Past Medical History:  Diagnosis Date  . Anemia   . Anxiety   . Medical history non-contributory    Past Surgical History:  Procedure Laterality Date  . NO PAST SURGERIES     Family History: family history includes Cancer in her maternal grandmother; Diabetes in her maternal aunt and maternal grandfather; Stroke in her maternal grandfather. Social History:  reports that she has never smoked. She has never used smokeless tobacco. She reports that she does not drink alcohol or use drugs.     Maternal Diabetes: No Genetic Screening: Normal Maternal Ultrasounds/Referrals: Normal   Growth US at 36 weeks 4 days EGA: EFW 6lb  9oz.   Fetal Ultrasounds or other Referrals:  None Maternal Substance Abuse:  No Significant Maternal Medications:  None Significant Maternal Lab Results:  None Other Comments:  None  ROS   Constitutional: Denies fevers/chills Cardiovascular: Denies chest pain or palpitations Pulmonary: Denies coughing or wheezing Gastrointestinal: Denies nausea, vomiting or diarrhea Genitourinary: Denies pelvic pain, unusual vaginal bleeding, unusual vaginal discharge, dysuria, urgency or frequency.  Musculoskeletal: Denies muscle or joint aches and pain.  Neurology: Denies abnormal sensations such as tingling or numbness.   History Dilation: 5 Effacement (%): 50 Station: -2 Exam by:: A. Gray RN  Blood pressure 108/70, pulse 95, temperature 97.9 F (36.6 C), temperature source Oral, resp. rate 18, height 5\' 5"  (1.651 m), weight  94.8 kg (209 lb), unknown if currently breastfeeding. Exam Physical Exam  Gen: No acute distress Pulm: Normal breathing efforts.  Prenatal labs: ABO, Rh: --/--/O POS (04/12 16100808) Antibody: NEG (04/12 [id number]) Rubella: Immune (09/12 0000) RPR: Nonreactive (09/12 0000)  HBsAg: Negative (09/12 0000)  HIV: Non-reactive (09/12 0000)  GBS: Negative (04/11 0000)   Assessment/Plan:  22 y/o G2P1 @ 39 weeks 4 days here for elective induction of labor Admit to L & D Induction of labor via pitocin and AROM. I discussed with patient risks, benefits and alternatives of labor induction including higher risk of cesarean delivery compared to spontaneous labor.  We discussed risks of induction agents including effects on fetal heart beat, contraction pattern and need for close monitoring.  Patient expressed understanding of all this and desired to proceed with the induction.  Konrad FelixKULWA,Jacari Kirsten WAKURU, MD 01/05/2018, 10:35 AM

## 2018-01-05 NOTE — Progress Notes (Signed)
Patient given tdap VIS

## 2018-01-06 LAB — CBC
HCT: 29.9 % — ABNORMAL LOW (ref 36.0–46.0)
HEMOGLOBIN: 9.4 g/dL — AB (ref 12.0–15.0)
MCH: 24.5 pg — ABNORMAL LOW (ref 26.0–34.0)
MCHC: 31.4 g/dL (ref 30.0–36.0)
MCV: 78.1 fL (ref 78.0–100.0)
PLATELETS: 273 10*3/uL (ref 150–400)
RBC: 3.83 MIL/uL — AB (ref 3.87–5.11)
RDW: 15 % (ref 11.5–15.5)
WBC: 10 10*3/uL (ref 4.0–10.5)

## 2018-01-06 NOTE — Discharge Summary (Signed)
OB Discharge Summary     Patient Name: Veronica Cooke DOB: 06-20-1996 MRN: [id number]  Date of admission: 01/05/2018 Delivering MD: Hoover Browns   Date of discharge: 01/06/2018  Admitting diagnosis: 39wks induction  Intrauterine pregnancy: [redacted]w[redacted]d     Secondary diagnosis:  Active Problems:   Pregnancy   Vaginal delivery  Additional problems: None     Discharge diagnosis: Term Pregnancy Delivered                                                                                                Post partum procedures:None  Augmentation: AROM and Pitocin  Complications: None  Hospital course:  Induction of Labor With Vaginal Delivery   22 y.o. yo [id number] at [redacted]w[redacted]d was admitted to the hospital 01/05/2018 for induction of labor.  Indication for induction: Elective.  Patient had an uncomplicated labor course as follows: Membrane Rupture Time/Date: 10:47 AM ,01/05/2018   Intrapartum Procedures: Episiotomy: None [1]                                         Lacerations:  1st degree [2]  Patient had delivery of a Viable infant.  Information for the patient's newborn:  Anisten, Tomassi [[id number]]      01/05/2018  Details of delivery can be found in separate delivery note.  Patient had a routine postpartum course. Patient is discharged home 01/06/18.  Physical exam  Vitals:   01/05/18 1610 01/05/18 1711 01/05/18 2049 01/06/18 0538  BP: 110/68 111/63 (!) 113/59 (!) 99/56  Pulse: 62 87 84 67  Resp: 18 20 18 16   Temp: 98 F (36.7 C) 98 F (36.7 C) 97.6 F (36.4 C) 98.1 F (36.7 C)  TempSrc: Oral Oral Oral Oral  SpO2:      Weight:      Height:       General: alert, cooperative and no distress Lochia: appropriate Uterine Fundus: firm Incision: N/A DVT Evaluation: No evidence of DVT seen on physical exam. Labs: Lab Results  Component Value Date   WBC 10.0 01/06/2018   HGB 9.4 (L) 01/06/2018   HCT 29.9 (L) 01/06/2018   MCV 78.1 01/06/2018   PLT 273 01/06/2018   CMP Latest  Ref Rng & Units 02/09/2016  Glucose 65 - 99 mg/dL 130(Q)  BUN 6 - 20 mg/dL 13  Creatinine 6.57 - 8.46 mg/dL 9.62  Sodium 952 - 841 mmol/L 138  Potassium 3.5 - 5.1 mmol/L 3.7  Chloride 101 - 111 mmol/L 107  CO2 22 - 32 mmol/L 23  Calcium 8.9 - 10.3 mg/dL 3.2(G)  Total Protein 6.5 - 8.1 g/dL 6.3(L)  Total Bilirubin 0.3 - 1.2 mg/dL 0.4  Alkaline Phos 38 - 126 U/L 124  AST 15 - 41 U/L 20  ALT 14 - 54 U/L 11(L)    Discharge instruction: per After Visit Summary and "Baby and Me Booklet".  After visit meds:  Allergies as of 01/06/2018   No Known Allergies     Medication List  STOP taking these medications   acetaminophen 500 MG tablet Commonly known as:  TYLENOL   calcium carbonate 500 MG chewable tablet Commonly known as:  TUMS - dosed in mg elemental calcium       Diet: routine diet  Activity: Advance as tolerated. Pelvic rest for 6 weeks.   Outpatient follow up:6 weeks Follow up Appt:No future appointments. Follow up Visit:No follow-ups on file.  Postpartum contraception: Undecided  Newborn Data: Live born female  Birth Weight: 7 lb 4.1 oz (3290 g) APGAR: 8, 9  Newborn Delivery   Birth date/time:  01/05/2018 13:41:00 Delivery type:  Vaginal, Spontaneous     Baby Feeding: Breast Disposition:home with mother   01/06/2018 Kenney HousemanNancy Jean Prothero, CNM

## 2018-01-06 NOTE — Lactation Note (Signed)
This note was copied from a baby's chart. Lactation Consultation Note  Patient Name: Veronica Cooke ZOXWR'UToday's Date: 01/06/2018 Reason for consult: Initial assessment   P2, Baby 24 hours.  Ex Bf but states this baby is latching much better than first. Mother states her R nipple is usually inverted but baby has helped evert nipple. Short shaft nipples. Mother is able to express colostrum. Observed feeding w/ frequent swallows. Provided mother with shells, comfort gels and hand pump. Mom made aware of O/P services, breastfeeding support groups, community resources, and our phone # for post-discharge questions.  Reviewed engorgement care and monitoring voids/stools. Mom encouraged to feed baby 8-12 times/24 hours and with feeding cues.     Maternal Data Has patient been taught Hand Expression?: Yes Does the patient have breastfeeding experience prior to this delivery?: Yes  Feeding Feeding Type: Breast Fed Length of feed: 10 min  LATCH Score Latch: Grasps breast easily, tongue down, lips flanged, rhythmical sucking.  Audible Swallowing: A few with stimulation  Type of Nipple: Everted at rest and after stimulation  Comfort (Breast/Nipple): Filling, red/small blisters or bruises, mild/mod discomfort  Hold (Positioning): No assistance needed to correctly position infant at breast.  LATCH Score: 8  Interventions Interventions: Breast feeding basics reviewed;Hand express;Comfort gels;Hand pump  Lactation Tools Discussed/Used Tools: Shells;Pump   Consult Status Consult Status: Follow-up Date: 01/07/18 Follow-up type: In-patient    Dahlia ByesBerkelhammer, Aundrea Horace [facility] 01/06/2018, 1:45 PM

## 2018-01-06 NOTE — Progress Notes (Signed)
CSW received consult for history of Anxiety and Depression. CSW spoke with mother of baby to offer support and to complete brief assessment. Patient stated she is doing well at this time, no concerns. Patient reports stable and happy mood since delivery, and has good support from family. Patient stated that her PCP is with [facility] and stated the feels comfortable talking to them regarding her mental health if needs were to arise. CSW encouraged patient to reach out for assistance before or after discharge.  CSW provided education regarding the baby blues period vs. perinatal mood disorders, discussed treatment, and gave resources for mental health follow up if concerns arise.    CSW provided review of Sudden Infant Death Syndrome (SIDS) precautions.  CSW identifies no further need for intervention and no barriers to discharge at this time.  Rilla Buckman, MSW, LCSW-A Clinical Social Worker [facility] [phone]     

## 2018-01-15 ENCOUNTER — Inpatient Hospital Stay (HOSPITAL_COMMUNITY): Admission: RE | Admit: 2018-01-15 | Payer: Medicaid Other | Source: Ambulatory Visit

## 2019-04-16 ENCOUNTER — Ambulatory Visit: Payer: Self-pay | Admitting: General Surgery

## 2019-04-26 ENCOUNTER — Encounter: Payer: Self-pay | Admitting: Obstetrics and Gynecology

## 2019-04-26 ENCOUNTER — Other Ambulatory Visit: Payer: Self-pay

## 2019-04-26 ENCOUNTER — Ambulatory Visit (INDEPENDENT_AMBULATORY_CARE_PROVIDER_SITE_OTHER): Payer: BLUE CROSS/BLUE SHIELD | Admitting: Obstetrics and Gynecology

## 2019-04-26 VITALS — BP 146/67 | HR 91 | Ht 64.0 in | Wt 145.9 lb

## 2019-04-26 DIAGNOSIS — N921 Excessive and frequent menstruation with irregular cycle: Secondary | ICD-10-CM

## 2019-04-26 DIAGNOSIS — Z975 Presence of (intrauterine) contraceptive device: Secondary | ICD-10-CM

## 2019-04-26 DIAGNOSIS — N8189 Other female genital prolapse: Secondary | ICD-10-CM
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
## 2019-04-26 NOTE — Progress Notes (Signed)
Patient comes in today as a new patient. She was referred by her PCP for a rectocele. She had a CT scan done that showed her IUD may have migrated.

## 2019-04-30 ENCOUNTER — Other Ambulatory Visit: Payer: Medicaid Other

## 2019-05-01 ENCOUNTER — Telehealth: Payer: Self-pay

## 2019-05-01 NOTE — Telephone Encounter (Signed)
Coronavirus (COVID-19) Are you at risk?  Are you at risk for the Coronavirus (COVID-19)?  To be considered HIGH RISK for Coronavirus (COVID-19), you have to meet the following criteria:  . Traveled to [place], [place], [name], [place] or [place]; or in the [place] to [place], [place], [place], or [place]; and have fever, cough, and shortness of breath within the last 2 weeks of travel OR . Been in close contact with a person diagnosed with COVID-19 within the last 2 weeks and have fever, cough, and shortness of breath . IF YOU DO NOT MEET THESE CRITERIA, YOU ARE CONSIDERED LOW RISK FOR COVID-19.  What to do if you are HIGH RISK for COVID-19?  . If you are having a medical emergency, call 911. . Seek medical care right away. Before you go to a doctor's office, urgent care or emergency department, call ahead and tell them about your recent travel, contact with someone diagnosed with COVID-19, and your symptoms. You should receive instructions from your physician's office regarding next steps of care.  . When you arrive at healthcare provider, tell the healthcare staff immediately you have returned from visiting [place], [place], [place], [place] or [name]; or traveled in the [place] to [place], [place], [place], or [place]; in the last two weeks or you have been in close contact with a person diagnosed with COVID-19 in the last 2 weeks.   . Tell the health care staff about your symptoms: fever, cough and shortness of breath. . After you have been seen by a medical provider, you will be either: o Tested for (COVID-19) and discharged home on quarantine except to seek medical care if symptoms worsen, and asked to  - Stay home and avoid contact with others until you get your results (4-5 days)  - Avoid travel on public transportation if possible (such as bus, train, or airplane) or o Sent to the Emergency Department by EMS for evaluation, COVID-19 testing, and possible  admission depending on your condition and test results.  What to do if you are LOW RISK for COVID-19?  Reduce your risk of any infection by using the same precautions used for avoiding the common cold or flu:  . Wash your hands often with soap and warm water for at least 20 seconds.  If soap and water are not readily available, use an alcohol-based hand sanitizer with at least 60% alcohol.  . If coughing or sneezing, cover your mouth and nose by coughing or sneezing into the elbow areas of your shirt or coat, into a tissue or into your sleeve (not your hands). . Avoid shaking hands with others and consider head nods or verbal greetings only. . Avoid touching your eyes, nose, or mouth with unwashed hands.  . Avoid close contact with people who are Catelin Manthe. . Avoid places or events with large numbers of people in one location, like concerts or sporting events. . Carefully consider travel plans you have or are making. . If you are planning any travel outside or inside the [place], visit the CDC's Travelers' Health webpage for the latest health notices. . If you have some symptoms but not all symptoms, continue to monitor at home and seek medical attention if your symptoms worsen. . If you are having a medical emergency, call 911.  05/01/19 SCREENING NEG SLS ADDITIONAL HEALTHCARE OPTIONS FOR PATIENTS  [facility] / e-Visit: [email or web address]         [facility]: [phone]    [facility]: [phone]                   [facility]: [phone]  

## 2019-05-02 ENCOUNTER — Telehealth: Payer: Self-pay | Admitting: Obstetrics and Gynecology

## 2019-05-02 ENCOUNTER — Other Ambulatory Visit: Payer: Self-pay

## 2019-05-02 ENCOUNTER — Ambulatory Visit (INDEPENDENT_AMBULATORY_CARE_PROVIDER_SITE_OTHER): Payer: BLUE CROSS/BLUE SHIELD

## 2019-05-02 DIAGNOSIS — Z975 Presence of (intrauterine) contraceptive device: Secondary | ICD-10-CM | POA: Diagnosis not present

## 2019-05-02 DIAGNOSIS — N921 Excessive and frequent menstruation with irregular cycle: Secondary | ICD-10-CM

## 2019-05-02 NOTE — Telephone Encounter (Signed)
Pt just had u/s in office pt would like to know if she  Needs to schedule a f/u apt or if Dr. Amalia Hailey will call her after reviewing the u/s. Please advise.

## 2019-05-03 NOTE — Telephone Encounter (Signed)
Please review US results

## 2019-05-09 NOTE — Telephone Encounter (Signed)
Notified patient of US results.

## 2019-05-15 ENCOUNTER — Ambulatory Visit (INDEPENDENT_AMBULATORY_CARE_PROVIDER_SITE_OTHER): Payer: Medicaid Other | Admitting: Obstetrics and Gynecology

## 2019-05-15 ENCOUNTER — Encounter: Payer: Self-pay | Admitting: Obstetrics and Gynecology

## 2019-05-15 ENCOUNTER — Other Ambulatory Visit: Payer: Self-pay

## 2019-05-15 VITALS — BP 109/57 | HR 99 | Ht 64.0 in | Wt 147.3 lb

## 2019-05-15 DIAGNOSIS — N8189 Other female genital prolapse: Secondary | ICD-10-CM | POA: Diagnosis not present
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
## 2019-05-16 NOTE — Progress Notes (Signed)
[id number] [place], [place] - [address] [facility] [address] [place] [place] [id number] Phone: [phone] Fax: [phone]      Your procedure is scheduled on August 25th.  [facility] at 6:30 A.M., and check in at the Admitting office.  Call this number if you have problems the morning of surgery:  [phone]  Call [phone] if you have any questions prior to your surgery date Monday-Friday 8am-4pm    Remember:  Do not eat or drink after midnight the night before your surgery   Take these medicines the morning of surgery with A SIP OF WATER   Hydroxyine - if needed    7 days prior to surgery STOP taking any Aspirin (unless otherwise instructed by your surgeon), Aleve, Naproxen, Ibuprofen, Motrin, Advil, Goody's, BC's, all herbal medications, fish oil, and all vitamins.    The Morning of Surgery  Do not wear jewelry, make-up or nail polish.  Do not wear lotions, powders, or perfumes, or deodorant  Do not shave 48 hours prior to surgery.    Do not bring valuables to the hospital.  [facility] not [facility] any belongings or valuables.  If you are a smoker, DO NOT Smoke 24 hours prior to surgery IF you wear a CPAP at night please bring your mask, tubing, and machine the morning of surgery   Remember that you must have someone to transport you home after your surgery, and remain with you for 24 hours if you are discharged the same day.   Contacts, glasses, hearing aids, dentures or bridgework may not be worn into surgery.    Leave your suitcase in the car.  After surgery it may be brought to your room.  For patients admitted to the hospital, discharge time will be determined by your treatment team.  Patients discharged the day of surgery will not be allowed to drive home.    Special instructions:   [facility]- Preparing For Surgery  Before surgery, you can play an  important role. Because skin is not sterile, your skin needs to be as free of germs as possible. You can reduce the number of germs on your skin by washing with CHG (chlorahexidine gluconate) Soap before surgery.  CHG is an antiseptic cleaner which kills germs and bonds with the skin to continue killing germs even after washing.    Oral Hygiene is also important to reduce your risk of infection.  Remember - BRUSH YOUR TEETH THE MORNING OF SURGERY WITH YOUR REGULAR TOOTHPASTE  Please do not use if you have an allergy to CHG or antibacterial soaps. If your skin becomes reddened/irritated stop using the CHG.  Do not shave (including legs and underarms) for at least 48 hours prior to first CHG shower. It is OK to shave your face.  Please follow these instructions carefully.   1. Shower the NIGHT BEFORE SURGERY and the MORNING OF SURGERY with CHG Soap.   2. If you chose to wash your hair, wash your hair first as usual with your normal shampoo.  3. After you shampoo, rinse your hair and body thoroughly to remove the shampoo.  4. Use CHG as you would any other liquid soap. You can apply CHG directly to the skin and wash gently with a scrungie or a clean washcloth.   5. Apply the CHG Soap to your body ONLY FROM THE NECK DOWN.  Do  not use on open wounds or open sores. Avoid contact with your eyes, ears, mouth and genitals (private parts). Wash Face and genitals (private parts)  with your normal soap.   6. Wash thoroughly, paying special attention to the area where your surgery will be performed.  7. Thoroughly rinse your body with warm water from the neck down.  8. DO NOT shower/wash with your normal soap after using and rinsing off the CHG Soap.  9. Pat yourself dry with a CLEAN TOWEL.  10. Wear CLEAN PAJAMAS to bed the night before surgery, wear comfortable clothes the morning of surgery  11. Place CLEAN SHEETS on your bed the night of your first shower and DO NOT SLEEP WITH PETS.    Day of  Surgery:  Do not apply any deodorants/lotions. Please shower the morning of surgery with the CHG soap  Please wear clean clothes to the hospital/surgery center.   Remember to brush your teeth WITH YOUR REGULAR TOOTHPASTE.   Please read over the following fact sheets that you were given.

## 2019-05-17 ENCOUNTER — Encounter (HOSPITAL_COMMUNITY)
Admission: RE | Admit: 2019-05-17 | Discharge: 2019-05-17 | Disposition: A | Discharge status: Home / Self Care | Payer: Medicaid Other | Source: Ambulatory Visit | Attending: General Surgery | Admitting: General Surgery

## 2019-05-17 ENCOUNTER — Other Ambulatory Visit (HOSPITAL_COMMUNITY)
Admission: RE | Admit: 2019-05-17 | Discharge: 2019-05-17 | Disposition: A | Discharge status: Home / Self Care | Payer: Medicaid Other | Source: Ambulatory Visit | Attending: General Surgery | Admitting: General Surgery

## 2019-05-17 ENCOUNTER — Encounter (HOSPITAL_COMMUNITY): Payer: Self-pay

## 2019-05-17 ENCOUNTER — Other Ambulatory Visit: Payer: Self-pay

## 2019-05-17 DIAGNOSIS — Z01812 Encounter for preprocedural laboratory examination: Secondary | ICD-10-CM | POA: Diagnosis not present

## 2019-05-17 DIAGNOSIS — Z20828 Contact with and (suspected) exposure to other viral communicable diseases: Secondary | ICD-10-CM | POA: Insufficient documentation

## 2019-05-17 HISTORY — DX: Headache, unspecified: R51.9

## 2019-05-17 LAB — CBC
HCT: 41.6 % (ref 36.0–46.0)
Hemoglobin: 13.2 g/dL (ref 12.0–15.0)
MCH: 28 pg (ref 26.0–34.0)
MCHC: 31.7 g/dL (ref 30.0–36.0)
MCV: 88.1 fL (ref 80.0–100.0)
Platelets: 316 10*3/uL (ref 150–400)
RBC: 4.72 MIL/uL (ref 3.87–5.11)
RDW: 12.1 % (ref 11.5–15.5)
WBC: 5.4 10*3/uL (ref 4.0–10.5)
nRBC: 0 % (ref 0.0–0.2)

## 2019-05-17 LAB — SARS CORONAVIRUS 2 (TAT 6-24 HRS): SARS Coronavirus 2: NEGATIVE

## 2019-05-17 NOTE — Progress Notes (Signed)
PCP - Ival Bible Cardiologist - denies  Chest x-ray - n/a EKG - n/a  Anesthesia review: n/a  Patient denies shortness of breath, fever, cough and chest pain at PAT appointment   Patient verbalized understanding of instructions that were given to them at the PAT appointment. Patient was also instructed that they will need to review over the PAT instructions again at home before surgery.   Pt's surgery moved to [facility].  PAT appointment completed at [facility].    Called and spoke with Randall at [facility] in regards to patient's appointment.  Received instructions for DOS.

## 2019-05-20 ENCOUNTER — Encounter (HOSPITAL_COMMUNITY): Payer: Self-pay | Admitting: Emergency Medicine

## 2019-05-20 ENCOUNTER — Other Ambulatory Visit: Payer: Self-pay

## 2019-05-20 NOTE — Progress Notes (Signed)
SURGERY LOCATION MOVED FROM [facility] TO [facility]. PATIENT HAD PAT APPT AT [facility] ON 05-17-2019. PATIENT CONTACTED AND MADE AWARE OF LOCATION CHANGE. PRE-OP INSTRUCTIONS REVIEWED WITH PATIENT. PATIENT VERBALIZED UNDERSTANDING. TO ARRIVE TO ADMITTING AT [id number]

## 2019-05-21 ENCOUNTER — Ambulatory Visit (HOSPITAL_COMMUNITY): Payer: Medicaid Other | Admitting: Certified Registered Nurse Anesthetist

## 2019-05-21 ENCOUNTER — Encounter (HOSPITAL_COMMUNITY)
Admission: RE | Disposition: A | Discharge status: Home / Self Care | Payer: Self-pay | Source: Ambulatory Visit | Attending: General Surgery

## 2019-05-21 ENCOUNTER — Encounter (HOSPITAL_COMMUNITY): Payer: Self-pay

## 2019-05-21 ENCOUNTER — Ambulatory Visit (HOSPITAL_COMMUNITY): Payer: Medicaid Other

## 2019-05-21 ENCOUNTER — Other Ambulatory Visit: Payer: Self-pay

## 2019-05-21 ENCOUNTER — Ambulatory Visit (HOSPITAL_COMMUNITY)
Admission: RE | Admit: 2019-05-21 | Discharge: 2019-05-21 | Disposition: A | Discharge status: Home / Self Care | Payer: Medicaid Other | Source: Ambulatory Visit | Attending: General Surgery | Admitting: General Surgery

## 2019-05-21 DIAGNOSIS — K8010 Calculus of gallbladder with chronic cholecystitis without obstruction: Secondary | ICD-10-CM | POA: Insufficient documentation

## 2019-05-21 DIAGNOSIS — Z419 Encounter for procedure for purposes other than remedying health state, unspecified: Secondary | ICD-10-CM

## 2019-05-21 DIAGNOSIS — F419 Anxiety disorder, unspecified: Secondary | ICD-10-CM | POA: Insufficient documentation

## 2019-05-21 DIAGNOSIS — K8080 Other cholelithiasis without obstruction: Secondary | ICD-10-CM | POA: Diagnosis present

## 2019-05-21 DIAGNOSIS — Z79899 Other long term (current) drug therapy: Secondary | ICD-10-CM | POA: Diagnosis not present

## 2019-05-21 HISTORY — DX: Reserved for inherently not codable concepts without codable children: IMO0001

## 2019-05-21 HISTORY — PX: CHOLECYSTECTOMY: SHX55

## 2019-05-21 LAB — PREGNANCY, URINE: Preg Test, Ur: NEGATIVE

## 2019-05-21 IMAGING — RF INTRAOPERATIVE CHOLANGIOGRAM
1 series · 12 of 12 positions shown · non-contrast
Comparison: CT abdomen pelvis-[DATE]

CLINICAL DATA: Intraoperative cholangiogram during laparoscopic
cholecystectomy.

EXAM:
INTRAOPERATIVE CHOLANGIOGRAM
FLUOROSCOPY TIME:  46 seconds

[Series 1: run · 3 acquisitions, 12 frames shown]
[im 1/3]
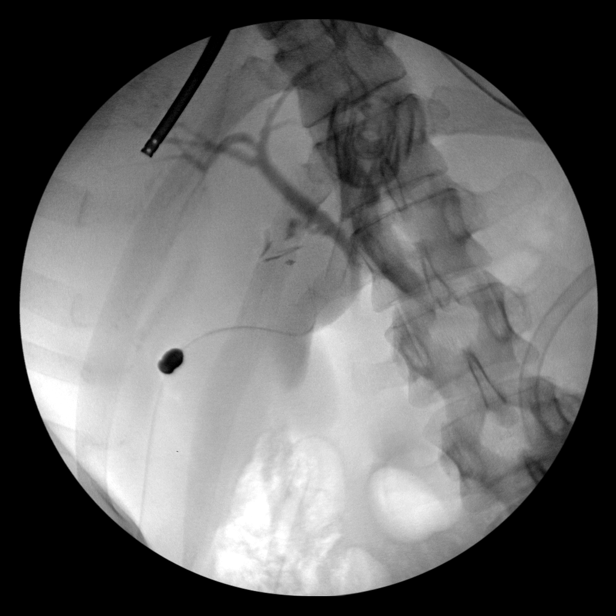
[im 1/3]
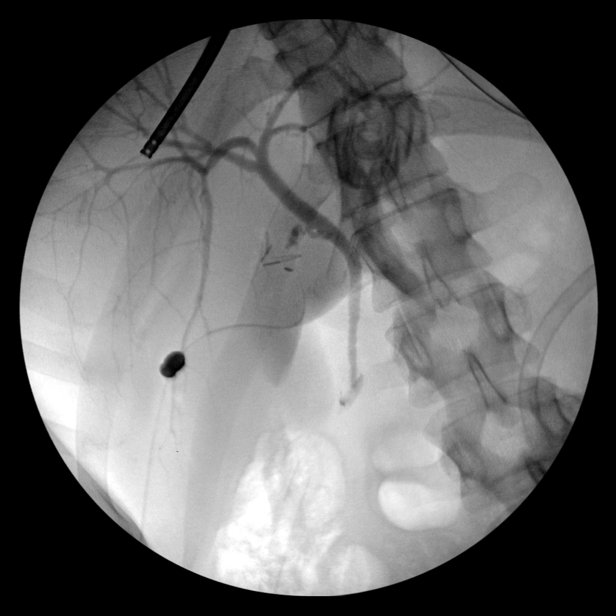
[im 1/3]
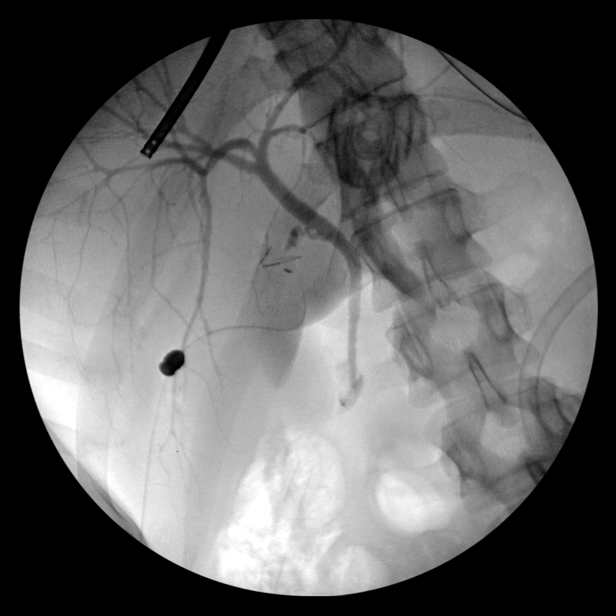
[im 1/3]
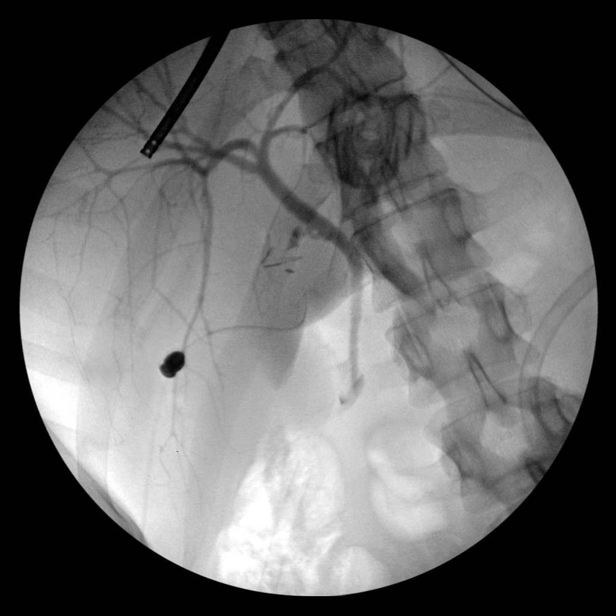
[im 2/3]
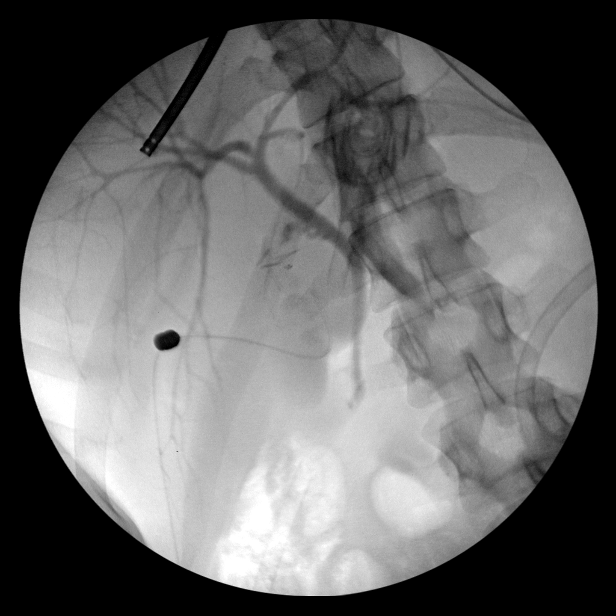
[im 2/3]
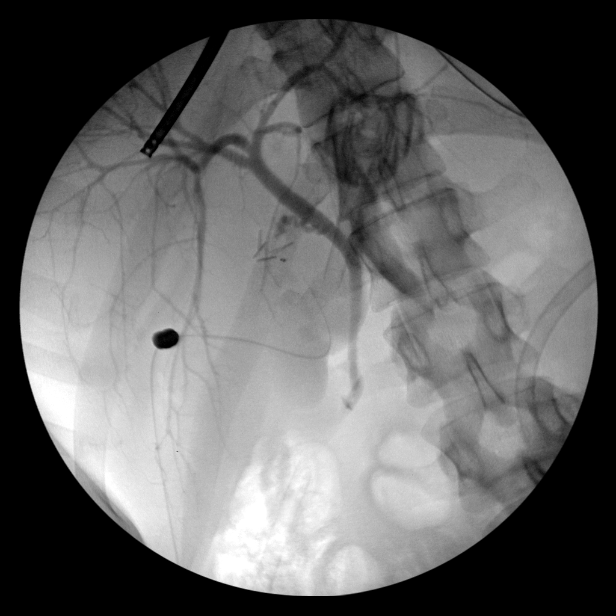
[im 2/3]
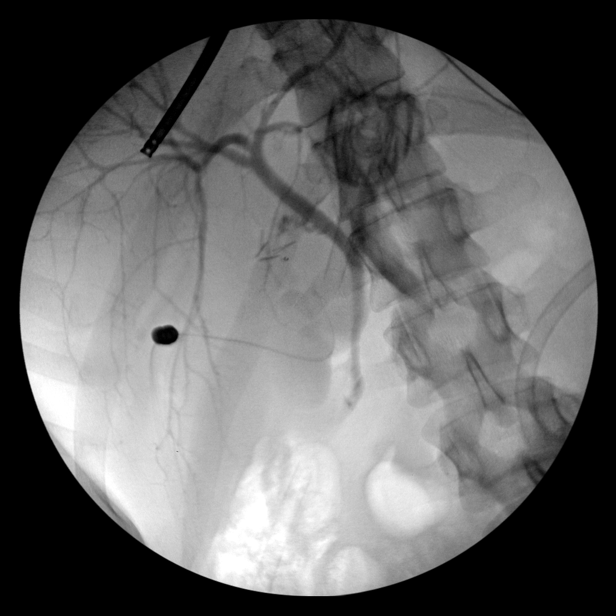
[im 2/3]
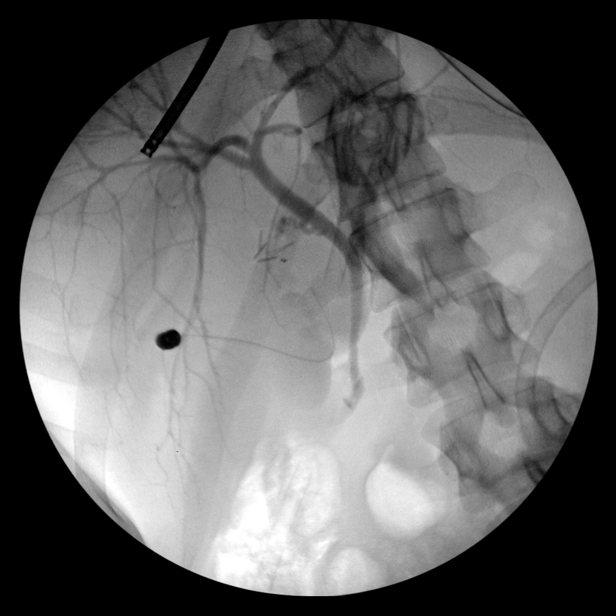
[im 3/3]
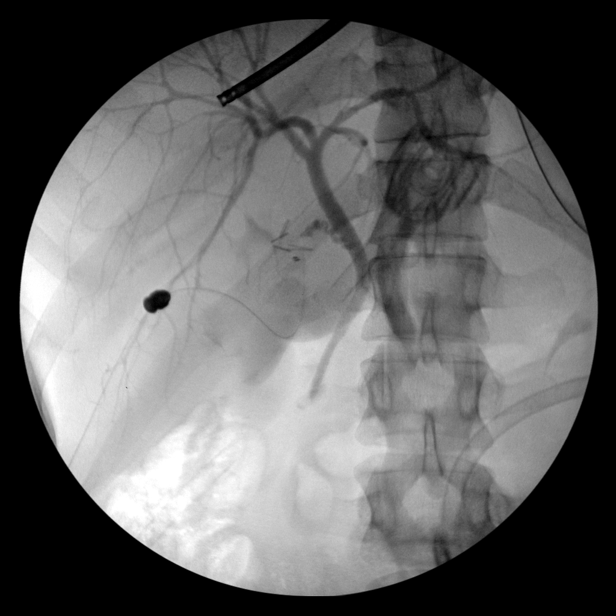
[im 3/3]
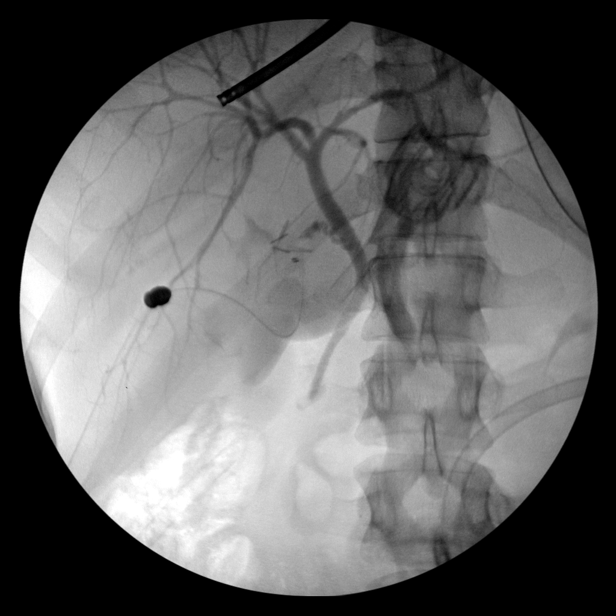
[im 3/3]
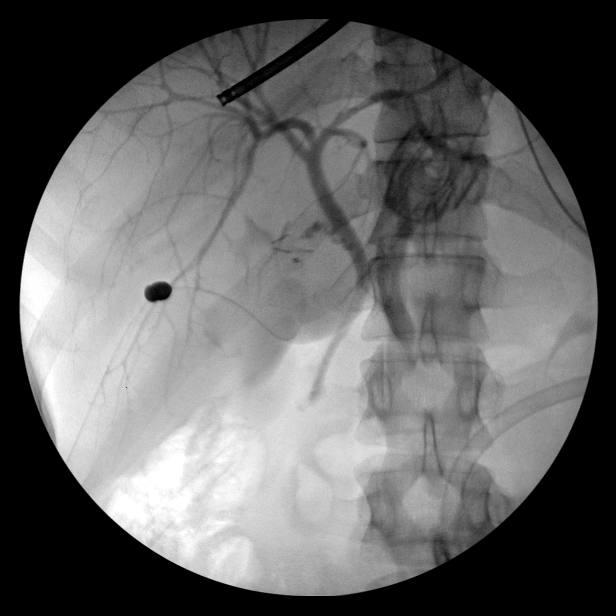
[im 3/3]
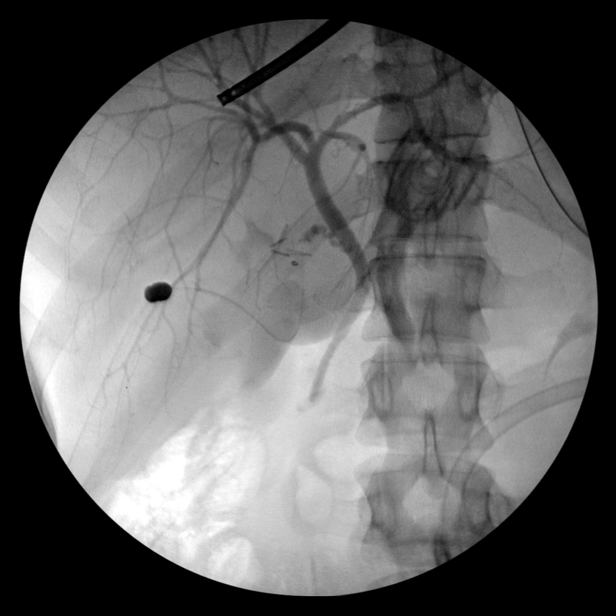

[12 of 12 positions shown; findings below may reference images not displayed]

FINDINGS: Intraoperative cholangiographic images of the right upper abdominal
quadrant during laparoscopic cholecystectomy are provided for
review.

Surgical clips overlie the expected location of the gallbladder
fossa.

Contrast injection demonstrates selective cannulation of the central
aspect of the cystic duct.

There is passage of contrast through the central aspect of the
cystic duct with filling of a non dilated common bile duct. There is
passage of contrast though the CBD and into the descending portion
of the duodenum.

There is minimal reflux of injected contrast into the common hepatic
duct and central aspect of the non dilated intrahepatic biliary
system.

There are no discrete filling defects within the opacified portions
of the biliary system to suggest the presence of
choledocholithiasis.
IMPRESSION: No evidence of choledocholithiasis.

## 2019-05-21 SURGERY — LAPAROSCOPIC CHOLECYSTECTOMY WITH INTRAOPERATIVE CHOLANGIOGRAM
Anesthesia: General | Site: Abdomen

## 2019-05-21 MED ORDER — DEXAMETHASONE SODIUM PHOSPHATE 10 MG/ML IJ SOLN
INTRAMUSCULAR | Status: AC
  Filled 2019-05-21: qty 1

## 2019-05-21 MED ORDER — CELECOXIB 200 MG PO CAPS
ORAL_CAPSULE | ORAL | Status: AC
  Filled 2019-05-21: qty 1

## 2019-05-21 MED ORDER — ONDANSETRON HCL 4 MG/2ML IJ SOLN
4.0000 mg | Freq: Once | INTRAMUSCULAR | Status: AC | PRN
  Administered 2019-05-21: 13:00:00 4 mg via INTRAVENOUS

## 2019-05-21 MED ORDER — ONDANSETRON HCL 4 MG/2ML IJ SOLN
INTRAMUSCULAR | Status: DC | PRN
  Administered 2019-05-21: 4 mg via INTRAVENOUS

## 2019-05-21 MED ORDER — FENTANYL CITRATE (PF) 250 MCG/5ML IJ SOLN
INTRAMUSCULAR | Status: AC
  Filled 2019-05-21: qty 5

## 2019-05-21 MED ORDER — FENTANYL CITRATE (PF) 100 MCG/2ML IJ SOLN
INTRAMUSCULAR | Status: AC
  Filled 2019-05-21: qty 2

## 2019-05-21 MED ORDER — SCOPOLAMINE 1 MG/3DAYS TD PT72
MEDICATED_PATCH | TRANSDERMAL | Status: DC | PRN
  Administered 2019-05-21: 1 via TRANSDERMAL

## 2019-05-21 MED ORDER — OXYCODONE HCL 5 MG PO TABS
5.0000 mg | ORAL_TABLET | Freq: Once | ORAL | Status: AC | PRN
  Administered 2019-05-21: 5 mg via ORAL

## 2019-05-21 MED ORDER — ONDANSETRON HCL 4 MG/2ML IJ SOLN
INTRAMUSCULAR | Status: AC
  Administered 2019-05-21: 4 mg via INTRAVENOUS
  Filled 2019-05-21: qty 2

## 2019-05-21 MED ORDER — PROPOFOL 10 MG/ML IV BOLUS
INTRAVENOUS | Status: AC
  Filled 2019-05-21: qty 20

## 2019-05-21 MED ORDER — 0.9 % SODIUM CHLORIDE (POUR BTL) OPTIME
TOPICAL | Status: DC | PRN
  Administered 2019-05-21: 1000 mL

## 2019-05-21 MED ORDER — OXYCODONE HCL 5 MG/5ML PO SOLN: 5.0000 mg | Freq: Once | ORAL | Status: AC | PRN

## 2019-05-21 MED ORDER — FENTANYL CITRATE (PF) 100 MCG/2ML IJ SOLN
25.0000 ug | INTRAMUSCULAR | Status: DC | PRN
  Administered 2019-05-21 (×2): 50 ug via INTRAVENOUS

## 2019-05-21 MED ORDER — SCOPOLAMINE 1 MG/3DAYS TD PT72
MEDICATED_PATCH | TRANSDERMAL | Status: AC
  Filled 2019-05-21: qty 1

## 2019-05-21 MED ORDER — ACETAMINOPHEN 500 MG PO TABS
ORAL_TABLET | ORAL | Status: AC
  Filled 2019-05-21: qty 2

## 2019-05-21 MED ORDER — PROPOFOL 10 MG/ML IV BOLUS
INTRAVENOUS | Status: DC | PRN
  Administered 2019-05-21: 150 mg via INTRAVENOUS
  Administered 2019-05-21: 50 mg via INTRAVENOUS

## 2019-05-21 MED ORDER — LACTATED RINGERS IV SOLN
INTRAVENOUS | Status: DC
  Administered 2019-05-21 (×2): via INTRAVENOUS

## 2019-05-21 MED ORDER — METOCLOPRAMIDE HCL 5 MG/ML IJ SOLN
INTRAMUSCULAR | Status: DC | PRN
  Administered 2019-05-21: 10 mg via INTRAVENOUS

## 2019-05-21 MED ORDER — ROCURONIUM BROMIDE 10 MG/ML (PF) SYRINGE
PREFILLED_SYRINGE | INTRAVENOUS | Status: AC
  Filled 2019-05-21: qty 10

## 2019-05-21 MED ORDER — GABAPENTIN 300 MG PO CAPS
300.0000 mg | ORAL_CAPSULE | ORAL | Status: AC
  Administered 2019-05-21: 300 mg via ORAL

## 2019-05-21 MED ORDER — PROPOFOL 500 MG/50ML IV EMUL
INTRAVENOUS | Status: DC | PRN
  Administered 2019-05-21: 25 ug/kg/min via INTRAVENOUS

## 2019-05-21 MED ORDER — GABAPENTIN 300 MG PO CAPS
ORAL_CAPSULE | ORAL | Status: AC
  Filled 2019-05-21: qty 1

## 2019-05-21 MED ORDER — HYDROCODONE-ACETAMINOPHEN 5-325 MG PO TABS: 1.0000 | ORAL_TABLET | Freq: Four times a day (QID) | ORAL | 0 refills | Status: DC | PRN

## 2019-05-21 MED ORDER — DEXAMETHASONE SODIUM PHOSPHATE 4 MG/ML IJ SOLN
INTRAMUSCULAR | Status: DC | PRN
  Administered 2019-05-21: 8 mg via INTRAVENOUS

## 2019-05-21 MED ORDER — GLUCAGON HCL RDNA (DIAGNOSTIC) 1 MG IJ SOLR
INTRAMUSCULAR | Status: AC
  Filled 2019-05-21: qty 1

## 2019-05-21 MED ORDER — LACTATED RINGERS IR SOLN
Status: DC | PRN
  Administered 2019-05-21: 1000 mL

## 2019-05-21 MED ORDER — OXYCODONE HCL 5 MG PO TABS
ORAL_TABLET | ORAL | Status: AC
  Filled 2019-05-21: qty 1

## 2019-05-21 MED ORDER — METOCLOPRAMIDE HCL 5 MG/ML IJ SOLN
INTRAMUSCULAR | Status: AC
  Filled 2019-05-21: qty 2

## 2019-05-21 MED ORDER — LIDOCAINE 2% (20 MG/ML) 5 ML SYRINGE
INTRAMUSCULAR | Status: DC | PRN
  Administered 2019-05-21: 80 mg via INTRAVENOUS

## 2019-05-21 MED ORDER — ONDANSETRON HCL 4 MG/2ML IJ SOLN
INTRAMUSCULAR | Status: AC
  Filled 2019-05-21: qty 2

## 2019-05-21 MED ORDER — DIPHENHYDRAMINE HCL 50 MG/ML IJ SOLN
INTRAMUSCULAR | Status: DC | PRN
  Administered 2019-05-21: 12.5 mg via INTRAVENOUS

## 2019-05-21 MED ORDER — CHLORHEXIDINE GLUCONATE CLOTH 2 % EX PADS: 6.0000 | MEDICATED_PAD | Freq: Once | CUTANEOUS | Status: DC

## 2019-05-21 MED ORDER — LIDOCAINE 2% (20 MG/ML) 5 ML SYRINGE
INTRAMUSCULAR | Status: AC
  Filled 2019-05-21: qty 5

## 2019-05-21 MED ORDER — CEFAZOLIN SODIUM-DEXTROSE 2-4 GM/100ML-% IV SOLN
2.0000 g | INTRAVENOUS | Status: AC
  Administered 2019-05-21: 2 g via INTRAVENOUS

## 2019-05-21 MED ORDER — ACETAMINOPHEN 500 MG PO TABS
1000.0000 mg | ORAL_TABLET | ORAL | Status: AC
  Administered 2019-05-21: 1000 mg via ORAL

## 2019-05-21 MED ORDER — ROCURONIUM BROMIDE 50 MG/5ML IV SOSY
PREFILLED_SYRINGE | INTRAVENOUS | Status: DC | PRN
  Administered 2019-05-21: 60 mg via INTRAVENOUS

## 2019-05-21 MED ORDER — MIDAZOLAM HCL 2 MG/2ML IJ SOLN
INTRAMUSCULAR | Status: AC
  Filled 2019-05-21: qty 2

## 2019-05-21 MED ORDER — GLUCAGON HCL RDNA (DIAGNOSTIC) 1 MG IJ SOLR
INTRAMUSCULAR | Status: DC | PRN
  Administered 2019-05-21: 1 mg via INTRAVENOUS

## 2019-05-21 MED ORDER — FENTANYL CITRATE (PF) 100 MCG/2ML IJ SOLN
INTRAMUSCULAR | Status: DC | PRN
  Administered 2019-05-21 (×4): 50 ug via INTRAVENOUS

## 2019-05-21 MED ORDER — SODIUM CHLORIDE 0.9 % IV SOLN
INTRAVENOUS | Status: DC | PRN
  Administered 2019-05-21: 18 mL

## 2019-05-21 MED ORDER — BUPIVACAINE-EPINEPHRINE (PF) 0.25% -1:200000 IJ SOLN
INTRAMUSCULAR | Status: AC
  Filled 2019-05-21: qty 30

## 2019-05-21 MED ORDER — CEFAZOLIN SODIUM-DEXTROSE 2-4 GM/100ML-% IV SOLN
INTRAVENOUS | Status: AC
  Filled 2019-05-21: qty 100

## 2019-05-21 MED ORDER — CELECOXIB 200 MG PO CAPS
200.0000 mg | ORAL_CAPSULE | ORAL | Status: AC
  Administered 2019-05-21: 09:00:00 200 mg via ORAL

## 2019-05-21 MED ORDER — SUGAMMADEX SODIUM 200 MG/2ML IV SOLN
INTRAVENOUS | Status: DC | PRN
  Administered 2019-05-21: 200 mg via INTRAVENOUS

## 2019-05-21 MED ORDER — BUPIVACAINE-EPINEPHRINE 0.25% -1:200000 IJ SOLN
INTRAMUSCULAR | Status: DC | PRN
  Administered 2019-05-21: 22 mL

## 2019-05-21 MED ORDER — MIDAZOLAM HCL 5 MG/5ML IJ SOLN
INTRAMUSCULAR | Status: DC | PRN
  Administered 2019-05-21: 2 mg via INTRAVENOUS

## 2019-05-21 SURGICAL SUPPLY — 29 items
APPLIER CLIP 5 13 M/L LIGAMAX5 (MISCELLANEOUS) ×2
CABLE HIGH FREQUENCY MONO STRZ (ELECTRODE) ×2 IMPLANT
CATH REDDICK CHOLANGI 4FR 50CM (CATHETERS) ×2 IMPLANT
CHLORAPREP W/TINT 26 (MISCELLANEOUS) ×2 IMPLANT
CLIP APPLIE 5 13 M/L LIGAMAX5 (MISCELLANEOUS) ×1 IMPLANT
COVER MAYO STAND STRL (DRAPES) ×2 IMPLANT
COVER WAND RF STERILE (DRAPES) IMPLANT
DECANTER SPIKE VIAL GLASS SM (MISCELLANEOUS) ×2 IMPLANT
DERMABOND ADVANCED (GAUZE/BANDAGES/DRESSINGS) ×1
DERMABOND ADVANCED .7 DNX12 (GAUZE/BANDAGES/DRESSINGS) ×1 IMPLANT
DRAPE C-ARM 42X120 X-RAY (DRAPES) ×2 IMPLANT
ELECT REM PT RETURN 15FT ADLT (MISCELLANEOUS) ×2 IMPLANT
GLOVE BIO SURGEON STRL SZ7.5 (GLOVE) ×2 IMPLANT
GOWN STRL REUS W/TWL XL LVL3 (GOWN DISPOSABLE) ×6 IMPLANT
HEMOSTAT SURGICEL 4X8 (HEMOSTASIS) IMPLANT
IV CATH 14GX2 1/4 (CATHETERS) ×2 IMPLANT
KIT BASIN OR (CUSTOM PROCEDURE TRAY) ×2 IMPLANT
KIT TURNOVER KIT A (KITS) IMPLANT
POUCH SPECIMEN RETRIEVAL 10MM (ENDOMECHANICALS) ×2 IMPLANT
SCISSORS LAP 5X35 DISP (ENDOMECHANICALS) ×2 IMPLANT
SET IRRIG TUBING LAPAROSCOPIC (IRRIGATION / IRRIGATOR) ×2 IMPLANT
SET TUBE SMOKE EVAC HIGH FLOW (TUBING) ×2 IMPLANT
SLEEVE XCEL OPT CAN 5 100 (ENDOMECHANICALS) ×4 IMPLANT
SUT MNCRL AB 4-0 PS2 18 (SUTURE) ×2 IMPLANT
TOWEL OR 17X26 10 PK STRL BLUE (TOWEL DISPOSABLE) ×2 IMPLANT
TOWEL OR NON WOVEN STRL DISP B (DISPOSABLE) ×2 IMPLANT
TRAY LAPAROSCOPIC (CUSTOM PROCEDURE TRAY) ×2 IMPLANT
TROCAR BLADELESS OPT 5 100 (ENDOMECHANICALS) ×2 IMPLANT
TROCAR XCEL BLUNT TIP 100MML (ENDOMECHANICALS) ×2 IMPLANT

## 2019-05-21 NOTE — H&P (Signed)
Veronica GellNicole Cooke  [facility] Patient #: [id number] DOB: 02/05/1996 Married / Language: English / Race: White Female   History of Present Illness  The patient is a 23 year old female who presents with abdominal pain. We are asked to see the patient in consultation by Dr. Normand Sloopillard to evaluate her for gallstones. The patient is a 23 year old white female who has been experiencing significant upper abdominal and substernal pain over the last several months. The pain seems to occur about 4 times in the last 3 weeks. The pain has been associated with significant nausea and vomiting. The pain seems to also radiate into her back. She recently underwent a CT scan that was highly suggestive of stones in the gallbladder but no gallbladder wall thickening or ductal dilatation. Her liver functions were normal.   Allergies  No Known Drug Allergies  Allergies Reconciled   Medication History  Citalopram Hydrobromide (10MG  Tablet, Oral) Active. hydrOXYzine HCl (Oral) Specific strength unknown - Active. Medications Reconciled    Review of Systems General Not Present- Appetite Loss, Chills, Fatigue, Fever, Night Sweats, Weight Gain and Weight Loss. Note: All other systems negative (unless as noted in HPI & included Review of Systems) Skin Not Present- Change in Wart/Mole, Dryness, Hives, Jaundice, New Lesions, Non-Healing Wounds, Rash and Ulcer. HEENT Not Present- Earache, Hearing Loss, Hoarseness, Nose Bleed, Oral Ulcers, Ringing in the Ears, Seasonal Allergies, Sinus Pain, Sore Throat, Visual Disturbances, Wears glasses/contact lenses and Yellow Eyes. Respiratory Not Present- Bloody sputum, Chronic Cough, Difficulty Breathing, Snoring and Wheezing. Breast Not Present- Breast Mass, Breast Pain, Nipple Discharge and Skin Changes. Cardiovascular Not Present- Chest Pain, Difficulty Breathing Lying Down, Leg Cramps, Palpitations, Rapid Heart Rate, Shortness of Breath and Swelling  of Extremities. Gastrointestinal Not Present- Abdominal Pain, Bloating, Bloody Stool, Change in Bowel Habits, Chronic diarrhea, Constipation, Difficulty Swallowing, Excessive gas, Gets full quickly at meals, Hemorrhoids, Indigestion, Nausea, Rectal Pain and Vomiting. Female Genitourinary Not Present- Frequency, Nocturia, Painful Urination, Pelvic Pain and Urgency. Musculoskeletal Not Present- Back Pain, Joint Pain, Joint Stiffness, Muscle Pain, Muscle Weakness and Swelling of Extremities. Neurological Not Present- Decreased Memory, Fainting, Headaches, Numbness, Seizures, Tingling, Tremor, Trouble walking and Weakness. Psychiatric Not Present- Anxiety, Bipolar, Change in Sleep Pattern, Depression, Fearful and Frequent crying. Endocrine Not Present- Cold Intolerance, Excessive Hunger, Hair Changes, Heat Intolerance, Hot flashes and New Diabetes. Hematology Not Present- Easy Bruising, Excessive bleeding, Gland problems, HIV and Persistent Infections.  Vitals  Weight: 144.2 lb Height: 64in Body Surface Area: 1.7 m Body Mass Index: 24.75 kg/m  Temp.: 98.66F  Pulse: 101 (Regular)  BP: 122/82(Sitting, Left Arm, Standard)       Physical Exam General Mental Status-Alert. General Appearance-Consistent with stated age. Hydration-Well hydrated. Voice-Normal.  Head and Neck Head-normocephalic, atraumatic with no lesions or palpable masses. Trachea-midline. Thyroid Gland Characteristics - normal size and consistency.  Eye Eyeball - Bilateral-Extraocular movements intact. Sclera/Conjunctiva - Bilateral-No scleral icterus.  Chest and Lung Exam Chest and lung exam reveals -quiet, even and easy respiratory effort with no use of accessory muscles and on auscultation, normal breath sounds, no adventitious sounds and normal vocal resonance. Inspection Chest Wall - Normal. Back - normal.  Cardiovascular Cardiovascular examination reveals -normal heart sounds,  regular rate and rhythm with no murmurs and normal pedal pulses bilaterally.  Abdomen Inspection Inspection of the abdomen reveals - No Hernias. Skin - Scar - no surgical scars. Palpation/Percussion Palpation and Percussion of the abdomen reveal - Soft, Non Tender, No Rebound tenderness, No Rigidity (guarding)  and No hepatosplenomegaly. Auscultation Auscultation of the abdomen reveals - Bowel sounds normal.  Neurologic Neurologic evaluation reveals -alert and oriented x 3 with no impairment of recent or remote memory. Mental Status-Normal.  Musculoskeletal Normal Exam - Left-Upper Extremity Strength Normal and Lower Extremity Strength Normal. Normal Exam - Right-Upper Extremity Strength Normal and Lower Extremity Strength Normal.  Lymphatic Head & Neck  General Head & Neck Lymphatics: Bilateral - Description - Normal. Axillary  General Axillary Region: Bilateral - Description - Normal. Tenderness - Non Tender. Femoral & Inguinal  Generalized Femoral & Inguinal Lymphatics: Bilateral - Description - Normal. Tenderness - Non Tender.    Assessment & Plan  GALLSTONES (K80.20) Impression: The patient appears to have symptomatic gallstones. Because of the risk of further painful episodes and possible pancreatitis at the think she would benefit from having her gallbladder removed. I have discussed with her in detail the risks and benefits of the operation as well as some of the technical aspects and she understands. She would like to talk to her family and then she will let us know if she would like to schedule.  Current Plans Pt Education - Gallstones: discussed with patient and provided information.

## 2019-05-21 NOTE — Anesthesia Procedure Notes (Addendum)
Procedure Name: Intubation Date/Time: 05/21/2019 10:12 AM Performed by: Deliah Boston, CRNA Pre-anesthesia Checklist: Patient identified, Emergency Drugs available, Suction available and Patient being monitored Patient Re-evaluated:Patient Re-evaluated prior to induction Oxygen Delivery Method: Circle system utilized Preoxygenation: Pre-oxygenation with 100% oxygen Induction Type: IV induction Ventilation: Mask ventilation without difficulty Laryngoscope Size: Mac and 3 Grade View: Grade I Tube type: Oral Number of attempts: 1 Airway Equipment and Method: Stylet and Oral airway Placement Confirmation: ETT inserted through vocal cords under direct vision,  positive ETCO2 and breath sounds checked- equal and bilateral Secured at: 21 cm Tube secured with: Tape Dental Injury: Teeth and Oropharynx as per pre-operative assessment  Comments: EVAN Paramedic student attempt x 2, Dr Christella Hartigan attempt with mac 3 blade, grade 1 view, atraumatic oral intubation, lips and teeth as preop.

## 2019-05-21 NOTE — Anesthesia Postprocedure Evaluation (Signed)
Anesthesia Post Note  Patient: Veronica Cooke  Procedure(s) Performed: LAPAROSCOPIC CHOLECYSTECTOMY WITH INTRAOPERATIVE CHOLANGIOGRAM (N/A Abdomen)     Patient location during evaluation: PACU Anesthesia Type: General Level of consciousness: awake and alert Pain management: pain level controlled Vital Signs Assessment: post-procedure vital signs reviewed and stable Respiratory status: spontaneous breathing, nonlabored ventilation and respiratory function stable Cardiovascular status: blood pressure returned to baseline and stable Postop Assessment: no apparent nausea or vomiting Anesthetic complications: no    Last Vitals:  Vitals:   05/21/19 1215 05/21/19 1316  BP: 110/74 123/86  Pulse: 62 77  Resp: (!) 21 18  Temp:  36.9 C  SpO2: 100% 99%    Last Pain:  Vitals:   05/21/19 1316  TempSrc: Oral  PainSc: 7                  Lidia Collum

## 2019-05-21 NOTE — Anesthesia Preprocedure Evaluation (Addendum)
Anesthesia Evaluation  Patient identified by MRN, date of birth, ID band Patient awake    Reviewed: Allergy & Precautions, NPO status , Patient's Chart, lab work & pertinent test results  History of Anesthesia Complications Negative for: history of anesthetic complications  Airway Mallampati: II  TM Distance: >3 FB Neck ROM: Full    Dental  (+) Teeth Intact   Pulmonary neg pulmonary ROS,    Pulmonary exam normal        Cardiovascular negative cardio ROS Normal cardiovascular exam     Neuro/Psych PSYCHIATRIC DISORDERS Anxiety negative neurological ROS     GI/Hepatic negative GI ROS, Neg liver ROS,   Endo/Other  negative endocrine ROS  Renal/GU negative Renal ROS  negative genitourinary   Musculoskeletal negative musculoskeletal ROS (+)   Abdominal   Peds  Hematology negative hematology ROS (+)   Anesthesia Other Findings   Reproductive/Obstetrics                            Anesthesia Physical Anesthesia Plan  ASA: I  Anesthesia Plan: General   Post-op Pain Management:    Induction: Intravenous  PONV Risk Score and Plan: 4 or greater and Ondansetron, Dexamethasone, Treatment may vary due to age or medical condition and Midazolam  Airway Management Planned: Oral ETT  Additional Equipment: None  Intra-op Plan:   Post-operative Plan: Extubation in OR  Informed Consent: I have reviewed the patients History and Physical, chart, labs and discussed the procedure including the risks, benefits and alternatives for the proposed anesthesia with the patient or authorized representative who has indicated his/her understanding and acceptance.     Dental advisory given  Plan Discussed with:   Anesthesia Plan Comments:        Anesthesia Quick Evaluation

## 2019-05-21 NOTE — Transfer of Care (Signed)
Immediate Anesthesia Transfer of Care Note  Patient: Veronica Cooke  Procedure(s) Performed: Procedure(s): LAPAROSCOPIC CHOLECYSTECTOMY WITH INTRAOPERATIVE CHOLANGIOGRAM (N/A)  Patient Location: PACU  Anesthesia Type:General  Level of Consciousness: Patient easily awoken, sedated, comfortable, cooperative, following commands, responds to stimulation.   Airway & Oxygen Therapy: Patient spontaneously breathing, ventilating well, oxygen via simple oxygen mask.  Post-op Assessment: Report given to PACU RN, vital signs reviewed and stable, moving all extremities.   Post vital signs: Reviewed and stable.  Complications: No apparent anesthesia complications Last Vitals:  Vitals Value Taken Time  BP 111/82 05/21/19 1120  Temp    Pulse 55 05/21/19 1122  Resp 16 05/21/19 1122  SpO2 100 % 05/21/19 1122  Vitals shown include unvalidated device data.  Last Pain:  Vitals:   05/21/19 0837  TempSrc:   PainSc: 0-No pain         Complications: No apparent anesthesia complications

## 2019-05-21 NOTE — Op Note (Signed)
05/21/2019  11:13 AM  PATIENT:  Veronica Cooke  23 y.o. female  PRE-OPERATIVE DIAGNOSIS:  GALLSTONES  POST-OPERATIVE DIAGNOSIS:  GALLSTONES  PROCEDURE:  Procedure(s): LAPAROSCOPIC CHOLECYSTECTOMY WITH INTRAOPERATIVE CHOLANGIOGRAM (N/A)  SURGEON:  Surgeon(s) and Role:    * Griselda Mineroth, Nefertiti Mohamad III, MD - Primary  PHYSICIAN ASSISTANT:   ASSISTANTS: none   ANESTHESIA:   local and general  EBL:  15 mL   BLOOD ADMINISTERED:none  DRAINS: none   LOCAL MEDICATIONS USED:  MARCAINE     SPECIMEN:  Source of Specimen:  gallbladder  DISPOSITION OF SPECIMEN:  PATHOLOGY  COUNTS:  YES  TOURNIQUET:  * No tourniquets in log *  DICTATION: .Dragon Dictation     Procedure: After informed consent was obtained the patient was brought to the operating room and placed in the supine position on the operating room table. After adequate induction of general anesthesia the patient's abdomen was prepped with ChloraPrep allowed to dry and draped in usual sterile manner. An appropriate timeout was performed. The area below the umbilicus was infiltrated with quarter percent  Marcaine. A small incision was made with a 15 blade knife. The incision was carried down through the subcutaneous tissue bluntly with a hemostat and [employer] retractors. The linea alba was identified. The linea alba was incised with a 15 blade knife and each side was grasped with Coker clamps. The preperitoneal space was then probed with a hemostat until the peritoneum was opened and access was gained to the abdominal cavity. A 0 Vicryl pursestring stitch was placed in the fascia surrounding the opening. A Hassan cannula was then placed through the opening and anchored in place with the previously placed Vicryl purse string stitch. The abdomen was insufflated with carbon dioxide without difficulty. A laparoscope was inserted through the [facility] cannula in the right upper quadrant was inspected. Next the epigastric region was infiltrated with %  Marcaine. A small incision was made with a 15 blade knife. A 5 mm port was placed bluntly through this incision into the abdominal cavity under direct vision. Next 2 sites were chosen laterally on the right side of the abdomen for placement of 5 mm ports. Each of these areas was infiltrated with quarter percent Marcaine. Small stab incisions were made with a 15 blade knife. 5 mm ports were then placed bluntly through these incisions into the abdominal cavity under direct vision without difficulty. A blunt grasper was placed through the lateralmost 5 mm port and used to grasp the dome of the gallbladder and elevated anteriorly and superiorly. Another blunt grasper was placed through the other 5 mm port and used to retract the body and neck of the gallbladder. A dissector was placed through the epigastric port and using the electrocautery the peritoneal reflection at the gallbladder neck was opened. Blunt dissection was then carried out in this area until the gallbladder neck-cystic duct junction was readily identified and a good window was created. A single clip was placed on the gallbladder neck. A small  ductotomy was made just below the clip with laparoscopic scissors. A 14-gauge Angiocath was then placed through the anterior abdominal wall under direct vision. A Reddick cholangiogram catheter was then placed through the Angiocath and flushed. The catheter was then placed in the cystic duct and anchored in place with a clip. A cholangiogram was obtained that showed no filling defects, no emptying into the duodenum, but an adequate length on the cystic duct. I could not feed my catheter into the cystic duct because of spiraling.  I did not see a filling defect in the distal cbd. I did give an amp of glucagon but still no emptying. The anchoring clip and catheters were then removed from the patient. 3 clips were placed proximally on the cystic duct and the duct was divided between the 2 sets of clips. Posterior to  this the cystic artery was identified and again dissected bluntly in a circumferential manner until a good window  was created. 2 clips were placed proximally and one distally on the artery and the artery was divided between the 2 sets of clips. Next a laparoscopic hook cautery device was used to separate the gallbladder from the liver bed. Prior to completely detaching the gallbladder from the liver bed the liver bed was inspected and several small bleeding points were coagulated with the electrocautery until the area was completely hemostatic. The gallbladder was then detached the rest of it from the liver bed without difficulty. A laparoscopic bag was inserted through the hassan port. The laparoscope was moved to the epigastric port. The gallbladder was placed within the bag and the bag was sealed.  The bag with the gallbladder was then removed with the [facility] cannula through the infraumbilical port without difficulty. The fascial defect was then closed with the previously placed Vicryl pursestring stitch as well as with another figure-of-eight 0 Vicryl stitch. The liver bed was inspected again and found to be hemostatic. The abdomen was irrigated with copious amounts of saline until the effluent was clear. The ports were then removed under direct vision without difficulty and were found to be hemostatic. The gas was allowed to escape. The skin incisions were all closed with interrupted 4-0 Monocryl subcuticular stitches. Dermabond dressings were applied. The patient tolerated the procedure well. At the end of the case all needle sponge and instrument counts were correct. The patient was then awakened and taken to recovery in stable condition  PLAN OF CARE: Discharge to home after PACU  PATIENT DISPOSITION:  PACU - hemodynamically stable.   Delay start of Pharmacological VTE agent (>24hrs) due to surgical blood loss or risk of bleeding: not applicable

## 2019-05-21 NOTE — Discharge Instructions (Signed)
Laparoscopic Cholecystectomy, Care After This sheet gives you information about how to care for yourself after your procedure. Your health care provider may also give you more specific instructions. If you have problems or questions, contact your health care provider. What can I expect after the procedure? After the procedure, it is common to have:  Pain at your incision sites. You will be given medicines to control this pain.  Mild nausea or vomiting.  Bloating and possible shoulder pain from the air-like gas that was used during the procedure. Follow these instructions at home: Incision care   Follow instructions from your health care provider about how to take care of your incisions. Make sure you: ? Wash your hands with soap and water before you change your bandage (dressing). If soap and water are not available, use hand sanitizer. ? Change your dressing as told by your health care provider. ? Leave stitches (sutures), skin glue, or adhesive strips in place. These skin closures may need to be in place for 2 weeks or longer. If adhesive strip edges start to loosen and curl up, you may trim the loose edges. Do not remove adhesive strips completely unless your health care provider tells you to do that.  Do not take baths, swim, or use a hot tub until your health care provider approves. Ask your health care provider if you can take showers. You may only be allowed to take sponge baths for bathing.  Check your incision area every day for signs of infection. Check for: ? More redness, swelling, or pain. ? More fluid or blood. ? Warmth. ? Pus or a bad smell. Activity  Do not drive or use heavy machinery while taking prescription pain medicine.  Do not lift anything that is heavier than 10 lb (4.5 kg) until your health care provider approves.  Do not play contact sports until your health care provider approves.  Do not drive for 24 hours if you were given a medicine to help you relax  (sedative).  Rest as needed. Do not return to work or school until your health care provider approves. General instructions  Take over-the-counter and prescription medicines only as told by your health care provider.  To prevent or treat constipation while you are taking prescription pain medicine, your health care provider may recommend that you: ? Drink enough fluid to keep your urine clear or pale yellow. ? Take over-the-counter or prescription medicines. ? Eat foods that are high in fiber, such as fresh fruits and vegetables, whole grains, and beans. ? Limit foods that are high in fat and processed sugars, such as fried and sweet foods. Contact a health care provider if:  You develop a rash.  You have more redness, swelling, or pain around your incisions.  You have more fluid or blood coming from your incisions.  Your incisions feel warm to the touch.  You have pus or a bad smell coming from your incisions.  You have a fever.  One or more of your incisions breaks open. Get help right away if:  You have trouble breathing.  You have chest pain.  You have increasing pain in your shoulders.  You faint or feel dizzy when you stand.  You have severe pain in your abdomen.  You have nausea or vomiting that lasts for more than one day.  You have leg pain. This information is not intended to replace advice given to you by your health care provider. Make sure you discuss any questions you have   with your health care provider. Document Released: 09/12/2005 Document Revised: 08/25/2017 Document Reviewed: 02/29/2016 Elsevier Patient Education  2020 [employer]  

## 2019-05-21 NOTE — Interval H&P Note (Signed)
History and Physical Interval Note:  05/21/2019 9:52 AM  Veronica Cooke  has presented today for surgery, with the diagnosis of GALLSTONES.  The various methods of treatment have been discussed with the patient and family. After consideration of risks, benefits and other options for treatment, the patient has consented to  Procedure(s): LAPAROSCOPIC CHOLECYSTECTOMY WITH INTRAOPERATIVE CHOLANGIOGRAM (N/A) as a surgical intervention.  The patient's history has been reviewed, patient examined, no change in status, stable for surgery.  I have reviewed the patient's chart and labs.  Questions were answered to the patient's satisfaction.     Autumn Messing III

## 2019-05-22 ENCOUNTER — Encounter (HOSPITAL_COMMUNITY): Payer: Self-pay | Admitting: General Surgery

## 2020-03-04 ENCOUNTER — Other Ambulatory Visit: Payer: Self-pay | Admitting: Nurse Practitioner

## 2020-03-04 DIAGNOSIS — N644 Mastodynia: Secondary | ICD-10-CM

## 2020-03-06 ENCOUNTER — Other Ambulatory Visit: Payer: Self-pay | Admitting: Nurse Practitioner

## 2020-03-06 ENCOUNTER — Ambulatory Visit
Admission: RE | Admit: 2020-03-06 | Discharge: 2020-03-06 | Disposition: A | Discharge status: Home / Self Care | Payer: Medicaid Other | Source: Ambulatory Visit | Attending: Nurse Practitioner | Admitting: Nurse Practitioner

## 2020-03-06 ENCOUNTER — Other Ambulatory Visit: Payer: Self-pay

## 2020-03-06 DIAGNOSIS — N644 Mastodynia: Secondary | ICD-10-CM

## 2020-03-06 IMAGING — US US BREAST*L* LIMITED INC AXILLA
1 series · 8 of 8 positions shown · non-contrast
Comparison: None.

CLINICAL DATA: 23-year-old female with diffuse LEFT breast pain and
fullness. Family history of breast cancer in 3 maternal aunts with
BRCA gene mutation.Mother is negative for BRCA gene mutation.

EXAM:
DIGITAL DIAGNOSTIC BILATERAL MAMMOGRAM WITH CAD AND TOMO
ULTRASOUND LEFT BREAST

[Series 1: us breast*left* limited inc axilla · 0.07mm/px · 8 of 8 slices shown]
[im 1/8]
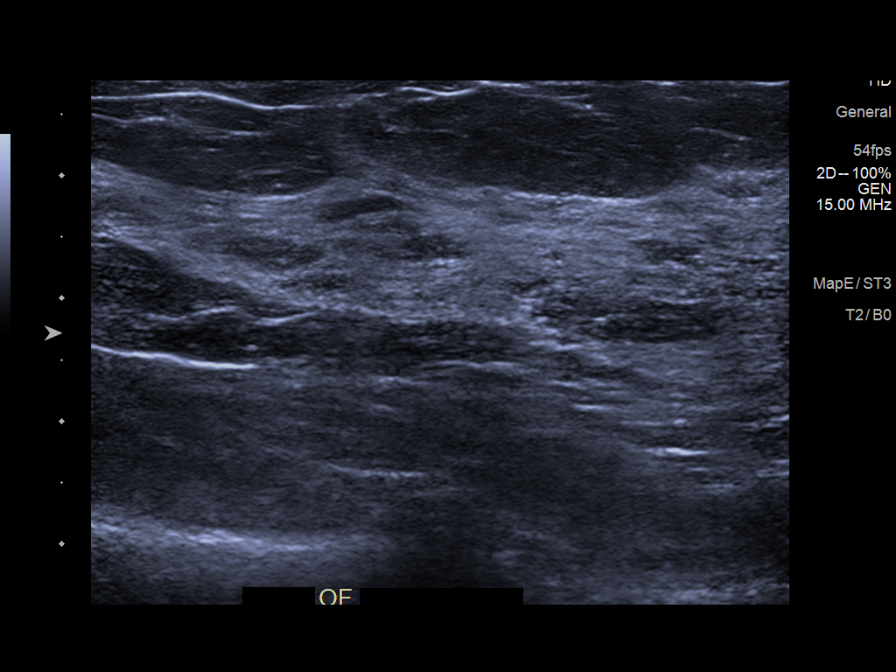
[im 2/8]
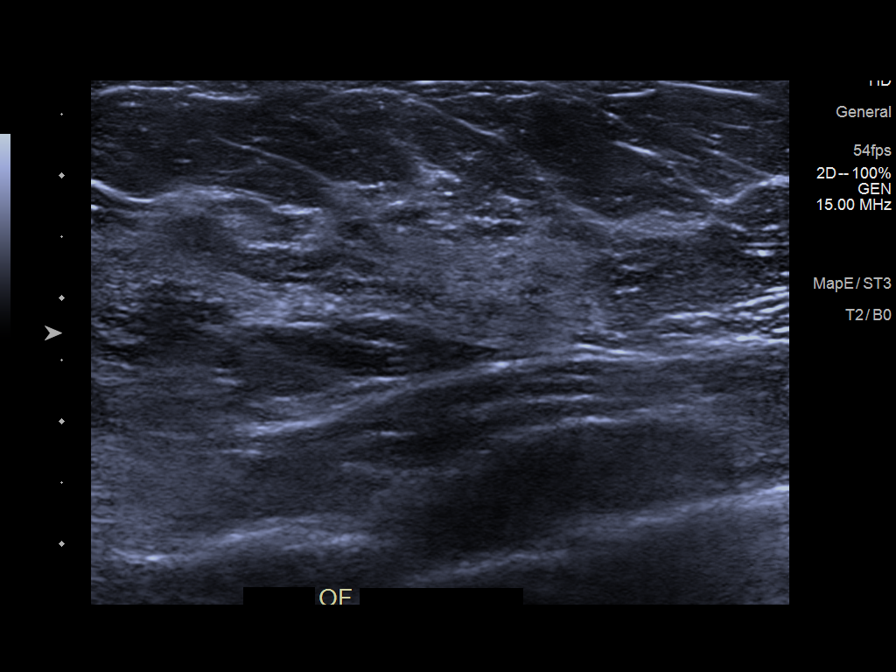
[im 3/8]
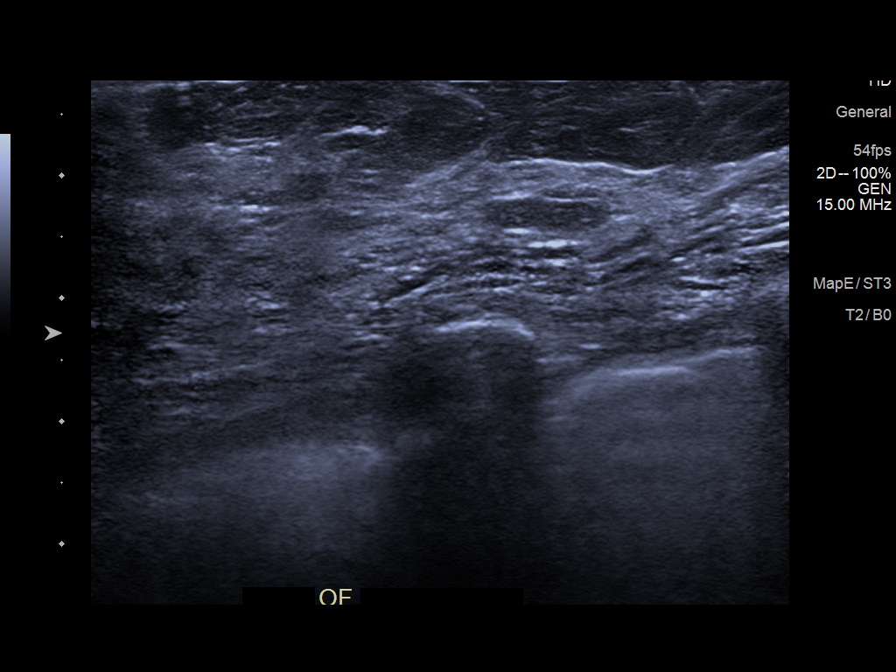
[im 4/8]
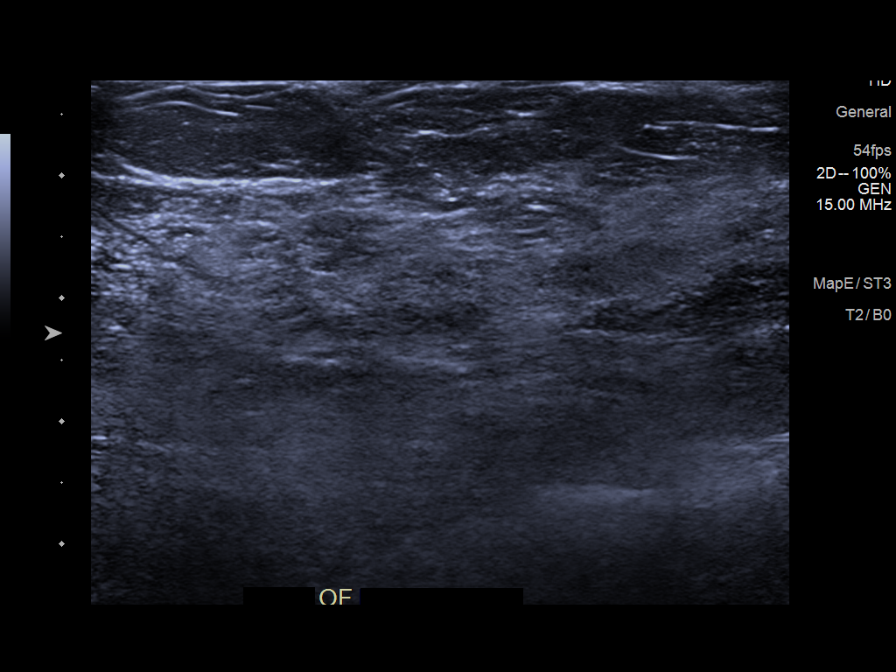
[im 5/8]
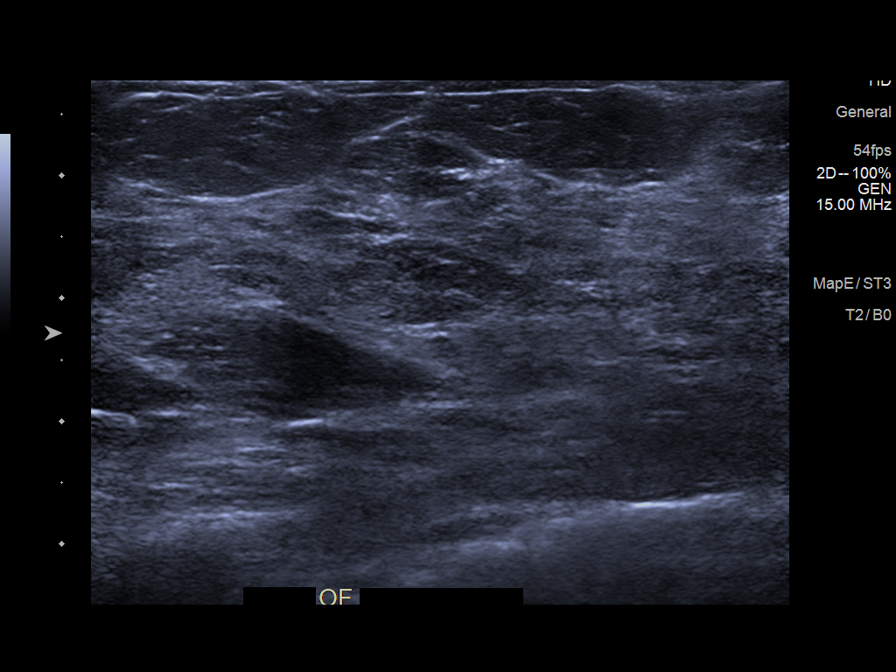
[im 6/8]
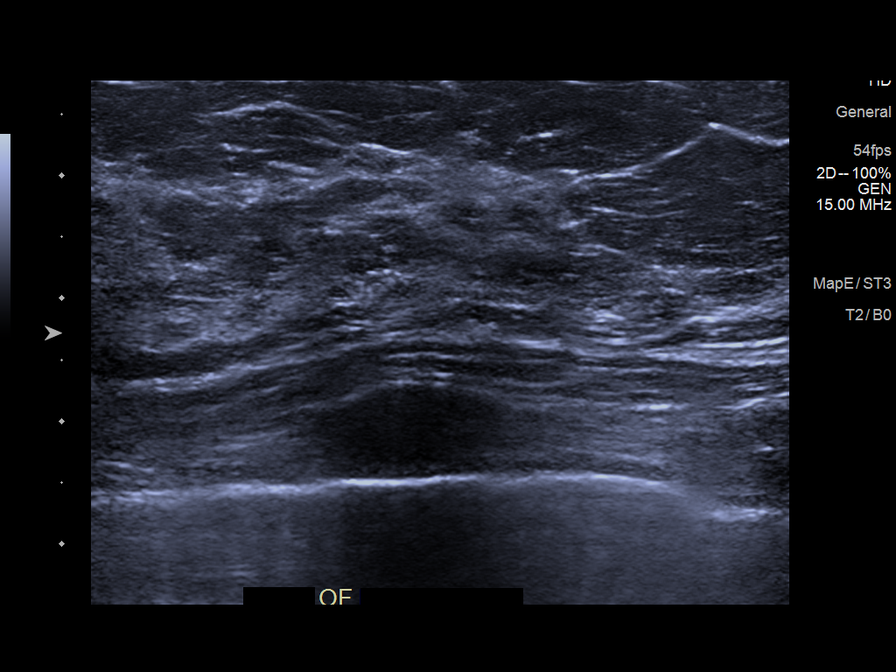
[im 7/8]
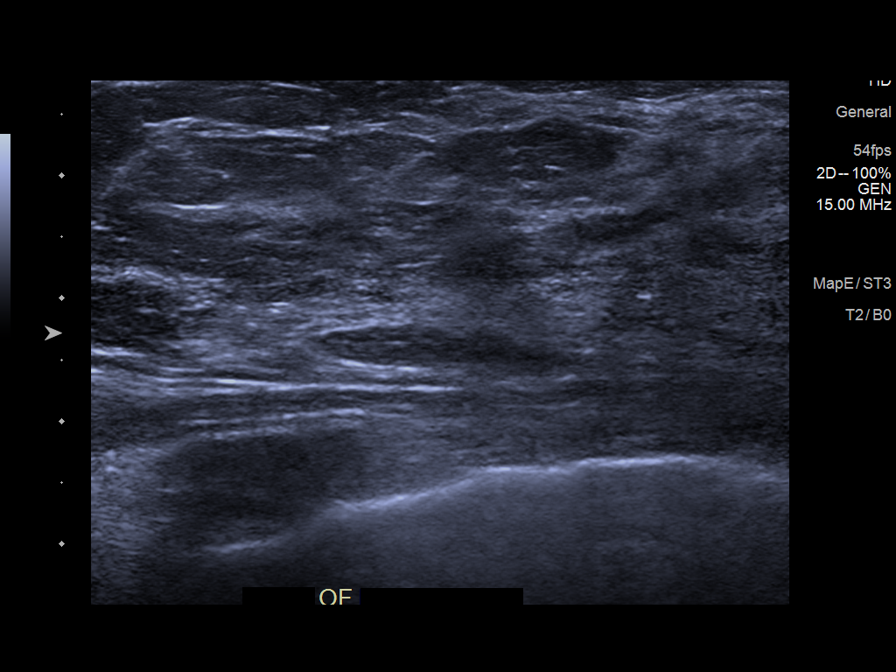
[im 8/8]
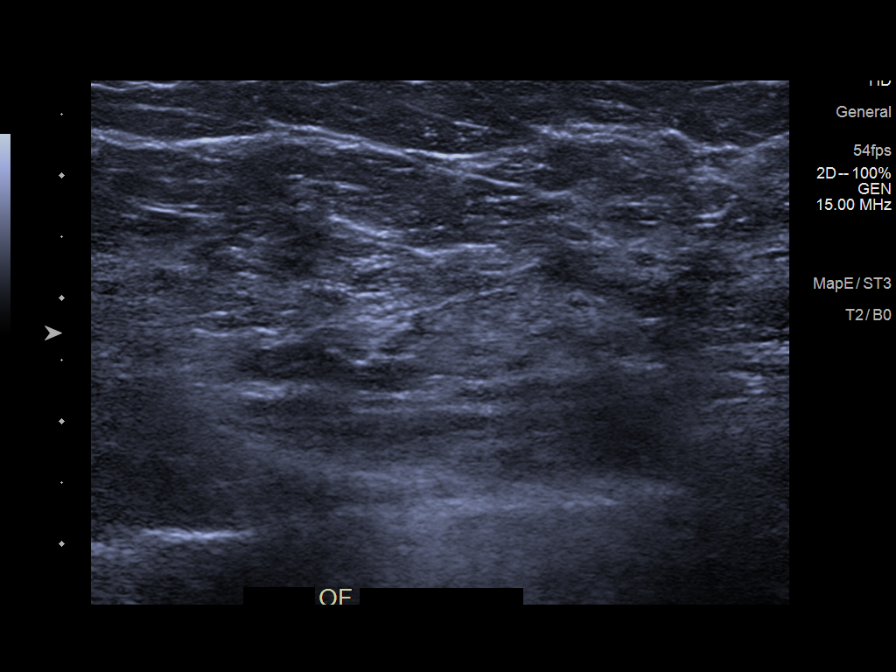

[8 of 8 positions shown; findings below may reference images not displayed]

ACR Breast Density Category c: The breast tissue is heterogeneously
dense, which may obscure small masses.
FINDINGS: A mammogram was performed given patient family history.

2D/3D full field views of both breasts demonstrate no suspicious
mass, distortion or worrisome calcifications.

Accessory fibroglandular breast tissue in the RIGHT axilla is noted.

Mammographic images were processed with CAD.

Targeted ultrasound is performed, showing no solid or cystic mass,
distortion or abnormal shadowing within the LEFT breast.
IMPRESSION: 1. No mammographic or sonographic abnormalities within the LEFT
breast.
2. No mammographic evidence of breast malignancy.
3. Accessory fibroglandular breast tissue within the RIGHT axilla.

RECOMMENDATION:
Bilateral screening mammogram at age 40 unless indicated earlier.

I have discussed the findings, causes of breast pain and
recommendations with the patient. If applicable, a reminder letter
will be sent to the patient regarding the next appointment.

BI-RADS CATEGORY  1: Negative.

## 2020-03-06 IMAGING — MG DIGITAL DIAGNOSTIC BILAT W/ TOMO W/ CAD
6 of 10 series · 6 of 30 positions shown · non-contrast
Comparison: None.

CLINICAL DATA: 23-year-old female with diffuse LEFT breast pain and
fullness. Family history of breast cancer in 3 maternal aunts with
BRCA gene mutation.Mother is negative for BRCA gene mutation.

EXAM:
DIGITAL DIAGNOSTIC BILATERAL MAMMOGRAM WITH CAD AND TOMO
ULTRASOUND LEFT BREAST

[L CC synth-2D]
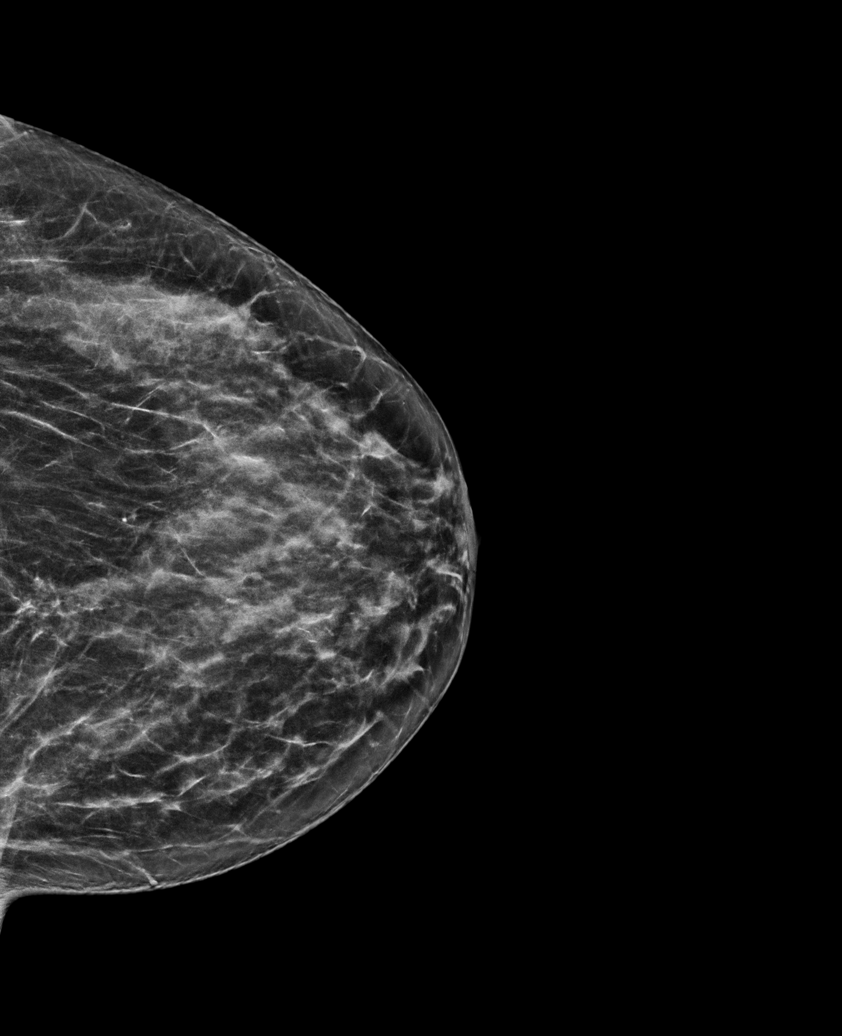

[L MLO synth-2D]
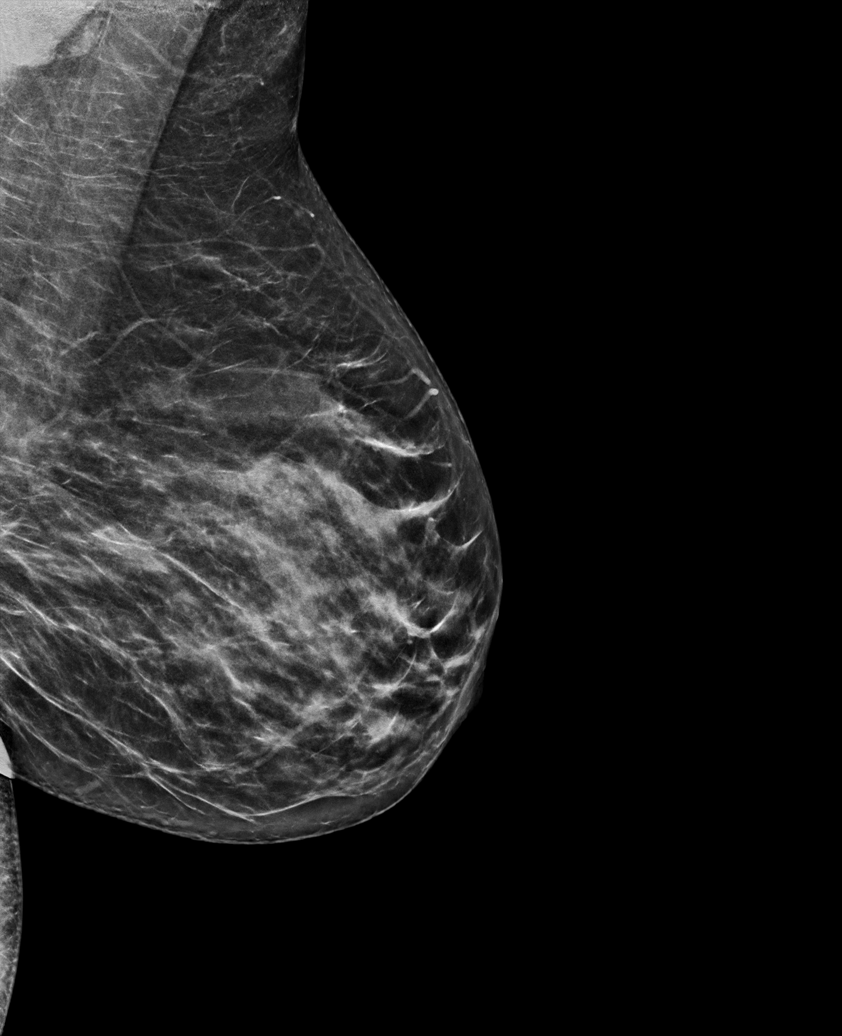

[R MLO synth-2D (1 of 2)]
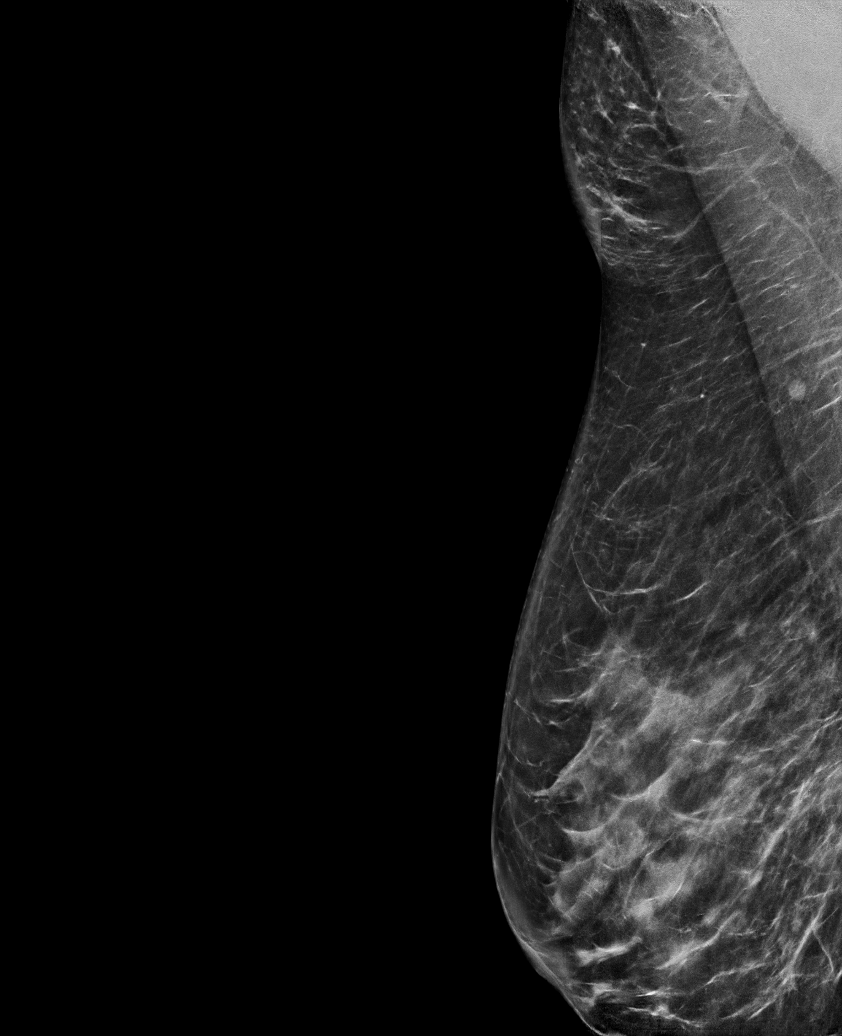

[R CC synth-2D]
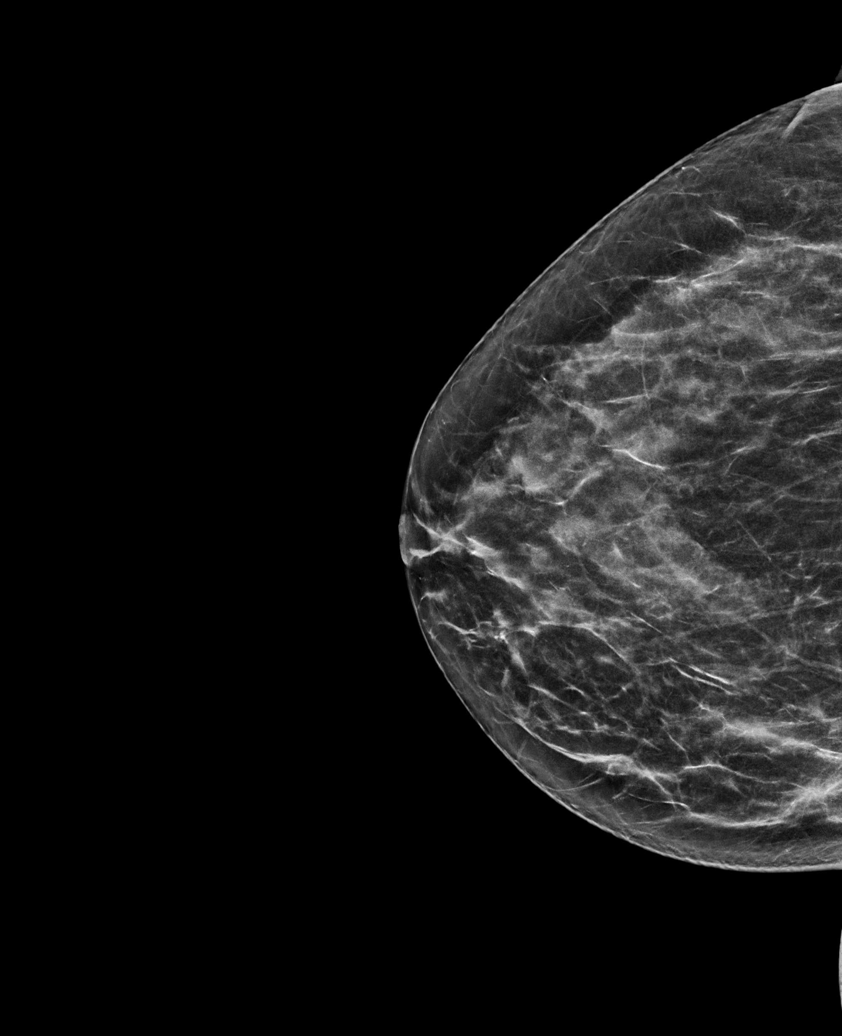

[R MLO synth-2D (2 of 2)]
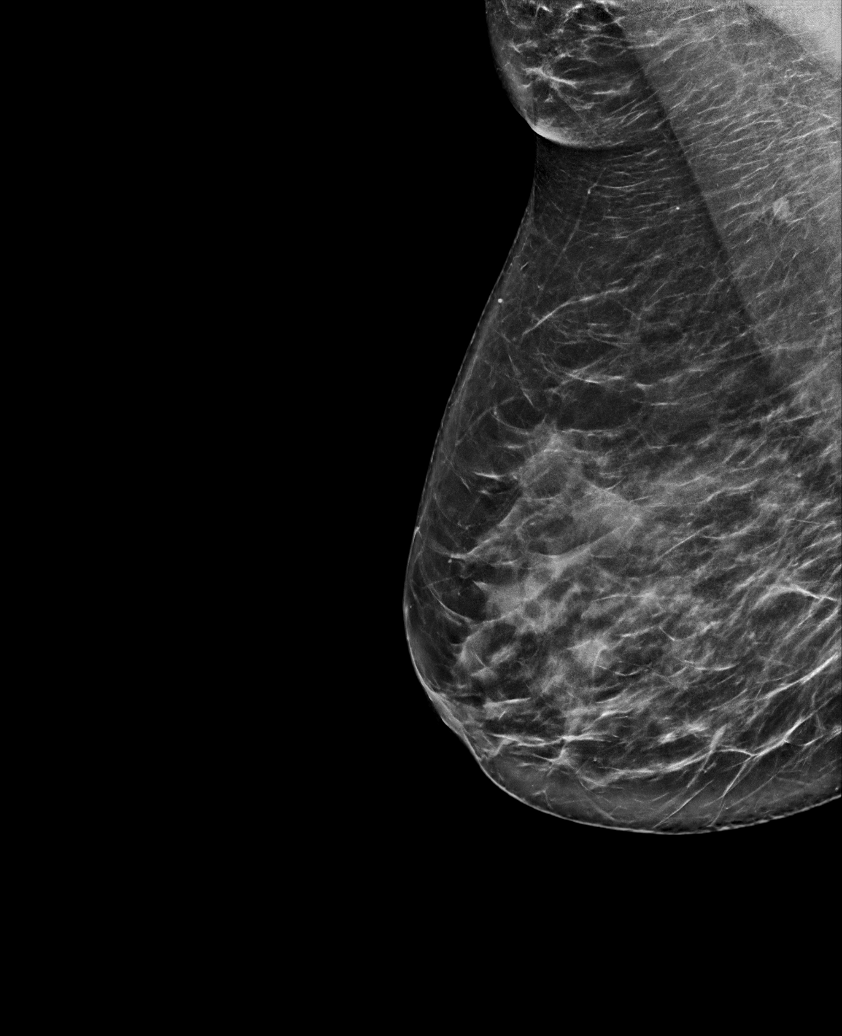

[R MLO tomo · tomo slice 41/80.0]
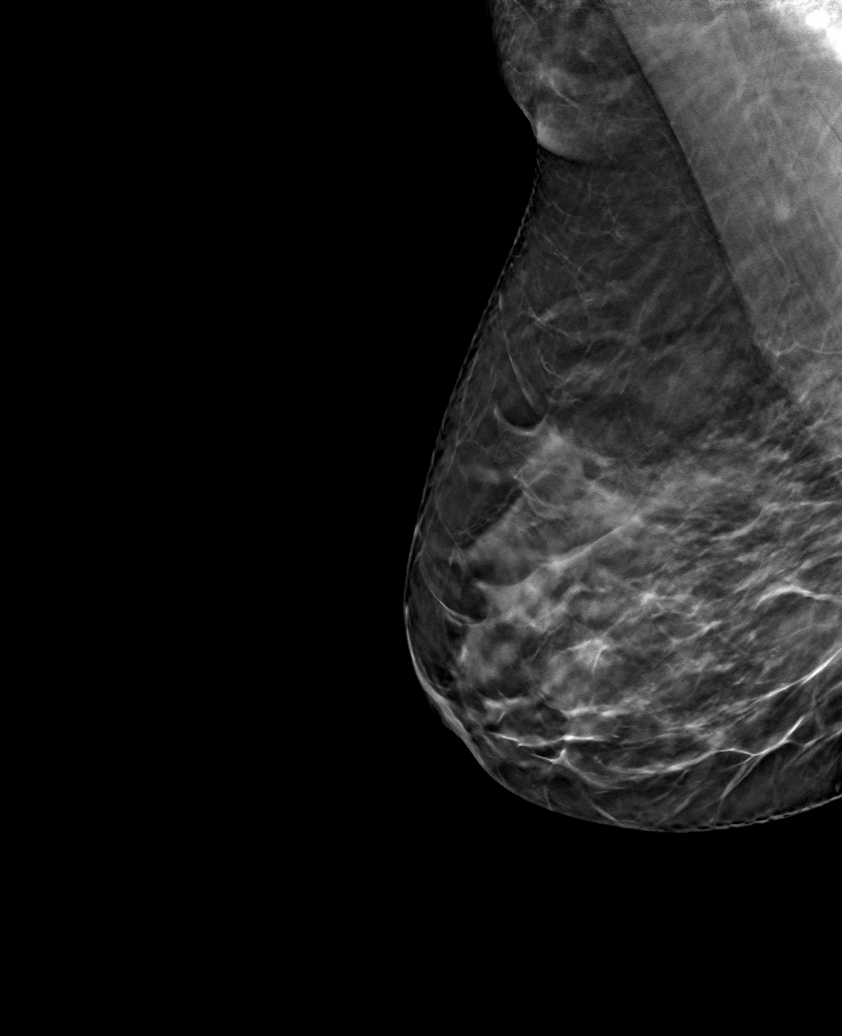

[6 of 30 positions shown; findings below may reference images not displayed]

ACR Breast Density Category c: The breast tissue is heterogeneously
dense, which may obscure small masses.
FINDINGS: A mammogram was performed given patient family history.

2D/3D full field views of both breasts demonstrate no suspicious
mass, distortion or worrisome calcifications.

Accessory fibroglandular breast tissue in the RIGHT axilla is noted.

Mammographic images were processed with CAD.

Targeted ultrasound is performed, showing no solid or cystic mass,
distortion or abnormal shadowing within the LEFT breast.
IMPRESSION: 1. No mammographic or sonographic abnormalities within the LEFT
breast.
2. No mammographic evidence of breast malignancy.
3. Accessory fibroglandular breast tissue within the RIGHT axilla.

RECOMMENDATION:
Bilateral screening mammogram at age 40 unless indicated earlier.

I have discussed the findings, causes of breast pain and
recommendations with the patient. If applicable, a reminder letter
will be sent to the patient regarding the next appointment.

BI-RADS CATEGORY  1: Negative.

## 2020-08-18 ENCOUNTER — Encounter: Payer: Self-pay | Admitting: Obstetrics and Gynecology

## 2020-08-18 ENCOUNTER — Ambulatory Visit (INDEPENDENT_AMBULATORY_CARE_PROVIDER_SITE_OTHER): Payer: Medicaid Other | Admitting: Obstetrics and Gynecology

## 2020-08-18 ENCOUNTER — Other Ambulatory Visit: Payer: Self-pay

## 2020-08-18 VITALS — BP 93/60 | HR 87 | Ht 64.0 in | Wt 157.3 lb

## 2020-08-18 DIAGNOSIS — M6289 Other specified disorders of muscle: Secondary | ICD-10-CM | POA: Diagnosis not present

## 2020-08-18 DIAGNOSIS — R102 Pelvic and perineal pain: Secondary | ICD-10-CM | POA: Diagnosis not present

## 2020-08-18 DIAGNOSIS — Z975 Presence of (intrauterine) contraceptive device: Secondary | ICD-10-CM

## 2020-08-18 DIAGNOSIS — N8189 Other female genital prolapse: Secondary | ICD-10-CM

## 2020-08-18 NOTE — Patient Instructions (Signed)
The Female Pelvic Floor  The pelvic floor consists of several layers of muscles that cover the bottom of the pelvic cavity. These muscles have several distinct roles:  1. To support the pelvic organs, the bladder, uterus and colon within the pelvis. 2. To assist in stopping and starting the flow of urine or the passage of gas or stool.To aid in sexual appreciation.  How to Locate the Pelvic Floor Muscles  The Urine Stop Test . At the midstream of your urine flow, squeeze the pelvic floor muscles. You should feel the sensation of the openings close and the muscles pulling up and into the pelvic cavity.  If you have strong muscles you will slow or stop the stream of urine. . Stop or slow the flow of urine without tensing the muscles of your legs, buttocks. . Do this only to locate the muscles, not as a daily exercise. Feeling the Muscle . Place a fingertip on the anal opening. Contract and lift the muscles as though you are holding back gas or stool. You will feel your anal opening tighten. . Insert 1 or 2 fingers into the vagina to feel the contraction and lifting of the muscles. You should feel the opening of the vagina tighten around your finger. Watching the Muscle Contract . Begin by lying on a flat surface.  Position yourself with your knees apart and bent with your head elevated and supported on several pillows. Use a mirror to look at the anal and vaginal openings and the perineal body (the area between the two openings).  . Contract or tighten the muscles around the openings and watch for a lifting of the perineal body and closure of the openings.   . If you see a bulge or feel tissues coming out of your openings, this is an incorrect contraction and you should notify your health care provider for more instructions.  2007, [employer] Doc.11  

## 2020-08-18 NOTE — Progress Notes (Signed)
Pt present due to pelvic pain. Pt stated having pain with sex, pain with urination and BM, unable to walk, carry children or do daily activities without being in pain.

## 2020-08-19 NOTE — Progress Notes (Signed)
GYNECOLOGY PROGRESS NOTE  Subjective:    Patient ID: Veronica Cooke, female    DOB: Jul 14, 1996, 24 y.o.   MRN: [id number]  HPI  Patient is a 24 y.o. G32P2002 female who presents for second opinion regarding complaints of pelvic floor relaxation.  Onset of symptoms began after the birth of her first child several years ago.  Began noting painful intercourse as well as pain in the rectal area.  Xoey was seen ~ 1 year ago by Dr. Brennan Bailey ([facility]) for her symptoms. She initially described problems with bowel movements and having to splint the posterior vagina to effectively empty. with bowel movements and having to splint the posterior vagina to effectively empty.  In addition she noted that she could sometimes see her IUD strings coming out the vagina (due to drop in the cervix).   Since last year, she reports that her symptoms have worsened, and is now experiencing pelvic pain, worsened with walking, crossing her legs.  Notes that pain is the worst with her menstrual cycles and cramping.  She also now reports difficulty with urination and bowel movements due to pain with Valsalva.  Is experiencing much more pelvic pressure.  She reports no interventions to date.  During her last consultation, she reports that the option of hysterectomy was discussed however was recommended only after patient had completed childbearing due to the risk of recurrence later in life and the possibility of symptoms persisting even after surgical intervention.  She states that at the time she was disheartened she was disheartened, however she and her husband have had discussions over the past year whether or not they had completed childbearing, and patient notes if it will help her symptoms she is not opposed to a hysterectomy at this time.  Shas a Skyla IUD in place, inserted roughly 3 years ago, but does not feel that it helped her cycles much (cycles have actually become longer).  Also reports having a "tilted" uterus.    The following portions of the patient's history were reviewed and updated as appropriate:     Gynecologic History:  Last pap smear: up to date per patient (performed by PCP), notes she had one performed last year.  History of STI's: denies. per patient (performed by PCP), notes she had one performed last year.  History of STI's: denies.    Menstrual History: Menarche age: 6 or 24 Patient's last menstrual period was 08/14/2020. Period Duration (Days): 5-7 Period Pattern: (!) Irregular Menstrual Flow: Heavy Menstrual Control: Tampon Menstrual Control Change Freq (Hours): 2-3 Dysmenorrhea: (!) Severe Dysmenorrhea Symptoms: Cramping, Diarrhea    OB History  Gravida Para Term Preterm AB Living  2 2 2     2   SAB TAB Ectopic Multiple Live Births        0 2    # Outcome Date GA Lbr Len/2nd Weight Sex Delivery Anes PTL Lv  2 Term 01/05/18 [redacted]w[redacted]d 01:36 / 00:15 7 lb 4.1 oz (3.29 kg) F Vag-Spont EPI  LIV  1 Term 02/13/16 [redacted]w[redacted]d 12:50 / 02:26 9 lb 1.7 oz (4.13 kg) M Vag-Spont EPI  LIV    She  has a past medical history of Anemia, Anxiety, Headache  She  has a past surgical history that includes No past surgeries; Wisdom tooth extraction (Bilateral, 2017); and Cholecystectomy (N/A, 05/21/2019).   Her family history includes Breast cancer (age of onset: 52) in her maternal aunt; Cancer in her maternal grandmother; Cystinosis in her mother; Diabetes in her maternal aunt and maternal grandfather; Gallbladder disease in her mother; Healthy in her father; Stroke in her maternal grandfather.   She  reports that she has never smoked. She has never used smokeless tobacco.  She reports that she does not drink alcohol and does not use drugs.   Current Outpatient Medications on File Prior to Visit  Medication Sig Dispense Refill  . baclofen (LIORESAL) 10 MG tablet Take 10 mg by mouth at bedtime.    . Butalbital-APAP-Caffeine 50-300-40 MG CAPS Take 1 tablet by mouth every 8 (eight) hours as needed (tension headaches).    . citalopram (CELEXA) 10 MG tablet Take 10 mg by mouth at bedtime.     . clonazePAM (KLONOPIN) 0.5 MG tablet Take 0.5 mg by mouth daily.    Marland Kitchen gabapentin (NEURONTIN)  600 MG tablet Take 600 mg by mouth at bedtime.    . hydrOXYzine (ATARAX/VISTARIL) 10 MG tablet Take 10 mg by mouth 3 (three) times daily as needed for anxiety.     Marland Kitchen HYDROcodone-acetaminophen (NORCO/VICODIN) 5-325 MG tablet Take 1-2 tablets by mouth every 6 (six) hours as needed for moderate pain or severe pain. (Patient not taking: Reported on 08/18/2020) 15 tablet 0   No current facility-administered medications on file prior to visit.   She has No Known Allergies..  Review of Systems Pertinent items noted in HPI and remainder of comprehensive ROS otherwise negative.   Objective:   Blood pressure 93/60, pulse 87, height 5\' 4"  (1.626 m), weight 157 lb 4.8 oz (71.4 kg), last menstrual period 08/14/2020. General appearance: alert and no distress Abdomen: normal findings: no masses palpable, no organomegaly and soft and abnormal findings:  moderate tenderness in the lower abdomen (worse in midline) Pelvic: external genitalia normal, rectovaginal septum normal.  Vagina without discharge.  Cervix normal appearing, no lesions and no motion tenderness, Grade 2 uterine prolapse present with rectocele. IUD threads visible, normal length ~ 3 cm.  Uterus mobile, tender, normal shape and size.  Adnexae non-palpable, nontender bilaterally.  Extremities: extremities normal, atraumatic, no cyanosis or edema Neurologic: Grossly normal   Assessment:   1. Pelvic floor relaxation   2. Pelvic pain   3. Pelvic floor dysfunction   4. IUD (intrauterine device) in place     Plan:   1.  Pelvic floor relaxation, with likely dysfunction and pain -patient with rectocele and mild uterine prolapse noted (although patient notes feeling the tip of her cervix at the vagina or seeing IUD threads protruding through the vagina at times, suggesting a higher level of prolapse).  Lengthy discussion had with patient regarding her options, including pelvic floor physical therapy, pessary device, and surgical intervention.  I  do agree with previous GYN consultation that surgical intervention should be a last resort as it may require subsequent surgical intervention in the future, as well as it may not completely resolve her symptoms.  Also discussed that while I understand that her symptoms are causing her distress and interfere with her activities of daily living, patient is still young and may have a risk of regret with permanent sterilization in the future.  Would still like to keep hysterectomy as a last resort option, especially in light of the fact the patient has not tried any other interventions at this time.  After discussion of all options patient notes that she is willing to try physical therapy first, although initially hesitant.  Will place referral.  2 .  IUD in place -currently with Skyla IUD.  She does report of her cycles (lengthening) use inserted.  After further discussion with patient regarding the time of her symptoms it appears that her symptoms started to become worse after the insertion.  In light of this information  as well as the patient reported issue of having a tilted uterus, it is possible that the IUD could be a significant source of her pain.  Discussed other options for birth control for patient her IUD is due for removal soon.  Patient may consider use of the NuvaRing or patch.  However if IUD removed and her symptoms persist, the IUD is the source of her pain could be ruled out and she could consider use again possibly at a higher dose such as the [place]. 3.  Patient to follow-up in 1 month for reassessment of symptoms after she has initiated physical therapy.   A total of 25 minutes were spent face-to-face with the patient during this encounter and over half of that time involved counseling and coordination of care.  Hildred Laser, MD [facility]

## 2020-09-14 NOTE — Progress Notes (Deleted)
Pt present for follow up for pelvic floor dysfunction.

## 2020-09-15 ENCOUNTER — Encounter: Payer: Medicaid Other | Admitting: Obstetrics and Gynecology

## 2020-09-16 ENCOUNTER — Telehealth: Payer: Self-pay

## 2020-09-16 MED ORDER — ACETAMINOPHEN-CODEINE #3 300-30 MG PO TABS: 1.0000 | ORAL_TABLET | Freq: Four times a day (QID) | ORAL | 0 refills | Status: DC | PRN

## 2020-09-16 NOTE — Addendum Note (Signed)
Addended by: Fabian November on: 09/16/2020 07:09 PM   Modules accepted: Orders

## 2020-09-16 NOTE — Telephone Encounter (Signed)
Pt called in and stated that she is in pain, the pt stated that she has been in pain for the past 7 days and cant get out of bed. The pt is requesting something to be sent in to her pharmacy [facility] on [address]. Please advise

## 2020-09-16 NOTE — Telephone Encounter (Signed)
Please advise. Thanks Coryn Mosso 

## 2020-09-16 NOTE — Telephone Encounter (Signed)
Spoke to pt concerning her pain. Pt stated that she has a prolapse uterus and having a lot of pain while on her cycle, stating that she unable to walk for 4 days now. Pt stated 4 ibuprofen 200mg  once a day without any relief. Pt was advised that she could take that amount every 6-8 hours along with tylenol. Pt stated that it does not help with pain is requesting pain medication. Pt stated that the baclofen also do not help with the pain.

## 2020-09-16 NOTE — Telephone Encounter (Signed)
I can send in a short supply of Tylenol #3.  This is a stronger pain medication (contains codeine). She can still alternate with this and Ibuprofen.

## 2020-09-17 NOTE — Telephone Encounter (Signed)
Pt called no answer LM via VM that [facility] sent in Tylenol # 3 for her pain to [facility] [address].

## 2020-10-01 ENCOUNTER — Ambulatory Visit: Payer: Medicaid Other | Admitting: Physical Therapy

## 2020-10-07 ENCOUNTER — Telehealth: Payer: Self-pay

## 2020-10-07 ENCOUNTER — Other Ambulatory Visit: Payer: Self-pay | Admitting: Obstetrics and Gynecology

## 2020-10-07 NOTE — Telephone Encounter (Signed)
Pt stated that she had some relief from the medication Tylenol #3. Pt stated having right side sharp pain and she was unable to walk.  Pt stated that she would like a refill of Tylenol #3.

## 2020-10-07 NOTE — Telephone Encounter (Signed)
Please advise 

## 2020-10-07 NOTE — Telephone Encounter (Signed)
Please advise. Thanks Manar Smalling 

## 2020-10-07 NOTE — Telephone Encounter (Signed)
Pt called in and stated that she had a ct scan at her pcp 2020.  The pt is in a lot of pain on her right side. The scan showed a cyst. The pt stated she just got off her period and doesn't know why she is in pain.The pt made an appointment for Friday to see dr. Valentino Saxon but she is requesting a call back from the nurse. Please advise

## 2020-10-08 ENCOUNTER — Ambulatory Visit: Payer: Medicaid Other | Admitting: Physical Therapy

## 2020-10-09 ENCOUNTER — Ambulatory Visit
Admission: RE | Admit: 2020-10-09 | Discharge: 2020-10-09 | Disposition: A | Discharge status: Home / Self Care | Payer: Medicaid Other | Source: Ambulatory Visit | Attending: Obstetrics and Gynecology | Admitting: Obstetrics and Gynecology

## 2020-10-09 ENCOUNTER — Ambulatory Visit (INDEPENDENT_AMBULATORY_CARE_PROVIDER_SITE_OTHER): Payer: Medicaid Other | Admitting: Obstetrics and Gynecology

## 2020-10-09 ENCOUNTER — Encounter: Payer: Self-pay | Admitting: Obstetrics and Gynecology

## 2020-10-09 ENCOUNTER — Other Ambulatory Visit: Payer: Self-pay

## 2020-10-09 VITALS — BP 97/65 | HR 81 | Ht 64.0 in | Wt 151.3 lb

## 2020-10-09 DIAGNOSIS — M6289 Other specified disorders of muscle: Secondary | ICD-10-CM

## 2020-10-09 DIAGNOSIS — N8189 Other female genital prolapse: Secondary | ICD-10-CM | POA: Diagnosis not present

## 2020-10-09 DIAGNOSIS — R102 Pelvic and perineal pain: Secondary | ICD-10-CM | POA: Insufficient documentation

## 2020-10-09 DIAGNOSIS — Z8742 Personal history of other diseases of the female genital tract: Secondary | ICD-10-CM

## 2020-10-09 DIAGNOSIS — N946 Dysmenorrhea, unspecified: Secondary | ICD-10-CM

## 2020-10-09 IMAGING — US US PELVIS COMPLETE WITH TRANSVAGINAL
1 series · 14 of 25 positions shown · non-contrast
Comparison: None

CLINICAL DATA: Pelvic pain and irregular menses.



[Series 1: us pelvic complete with transvaginal · 14 of 81 slices shown]
[im 1/81]
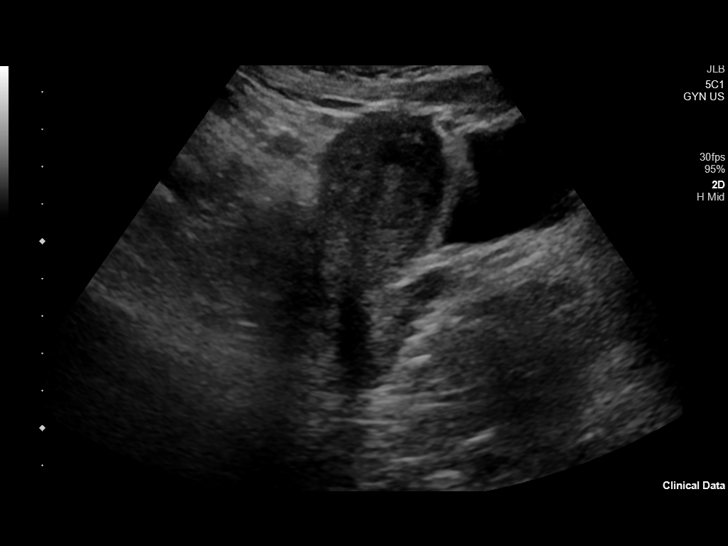
[im 7/81]
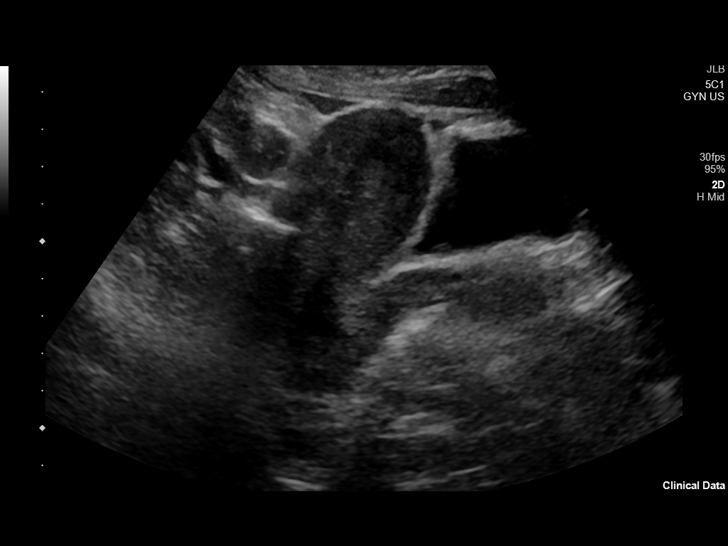
[im 14/81]
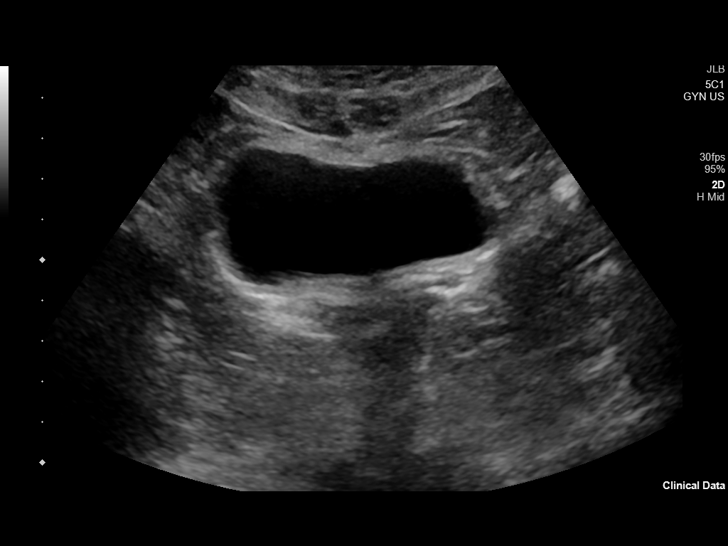
[im 21/81]
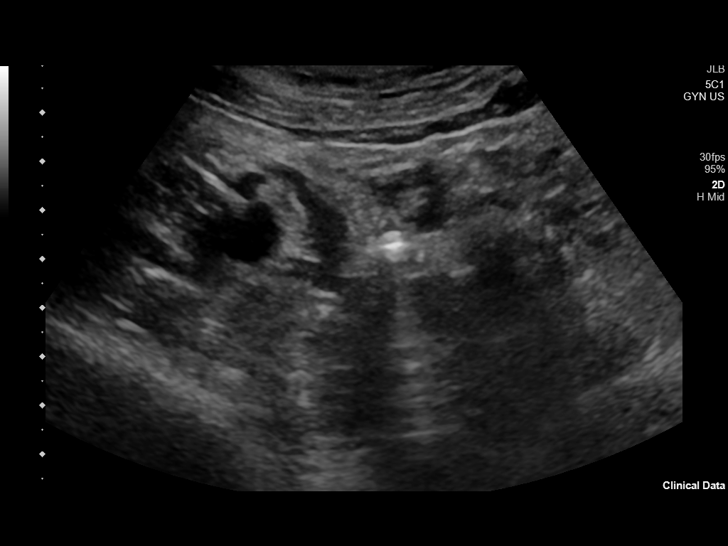
[im 27/81]
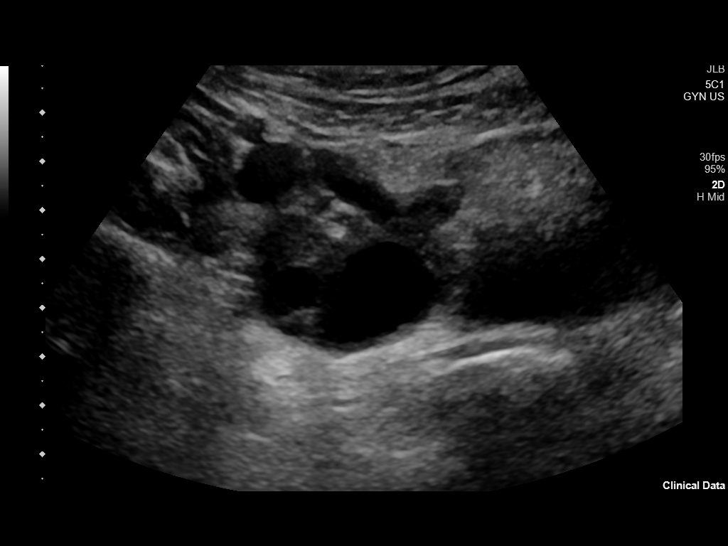
[im 31/81]
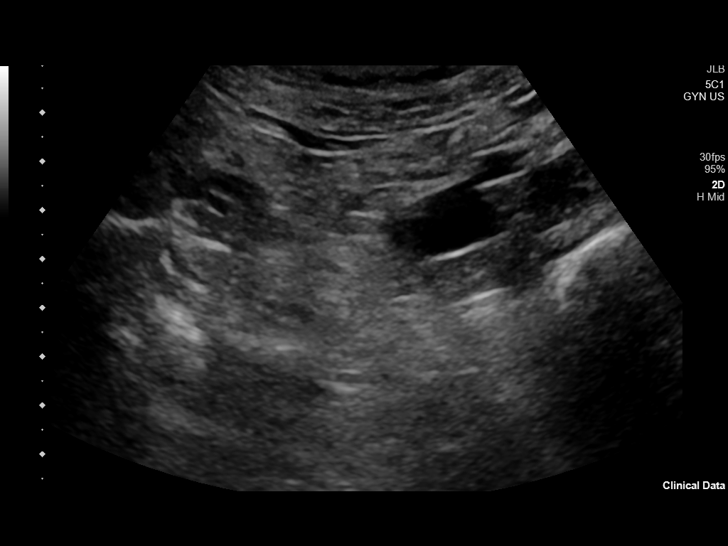
[im 37/81]
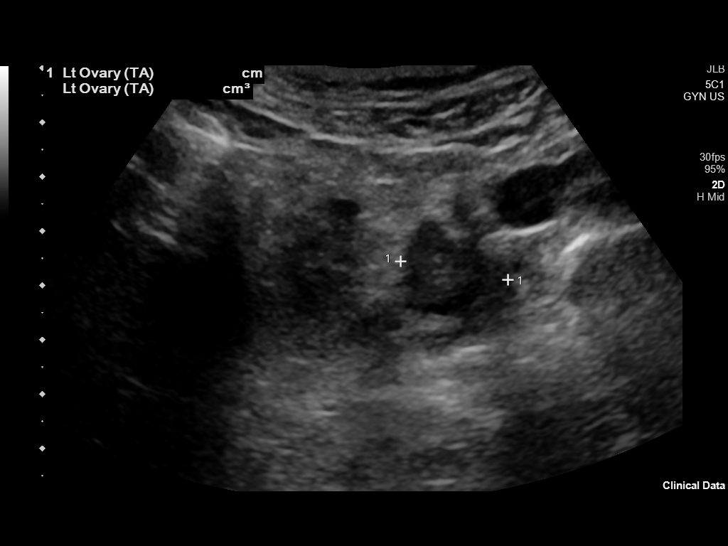
[im 44/81]
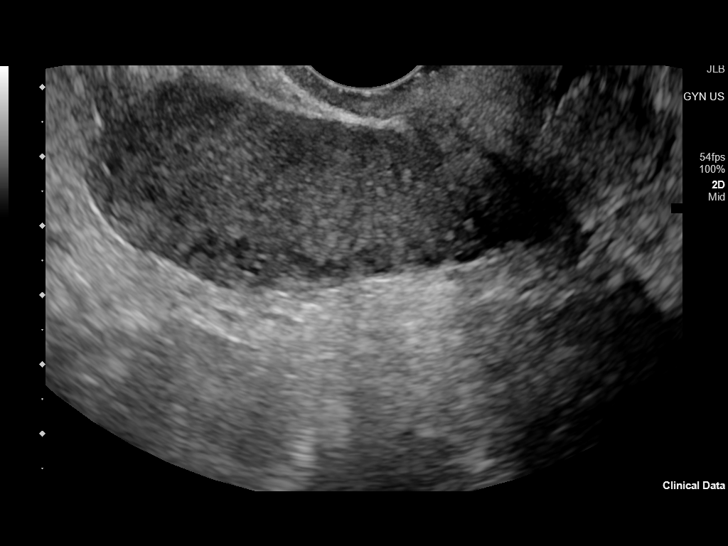
[im 51/81]
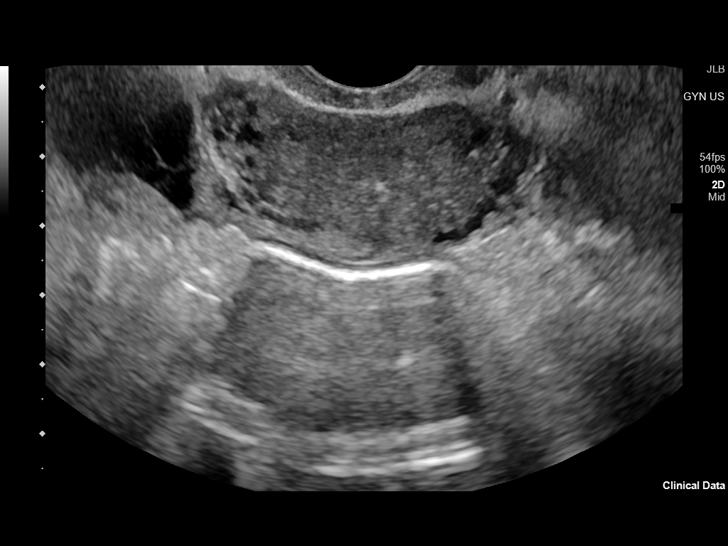
[im 54/81]
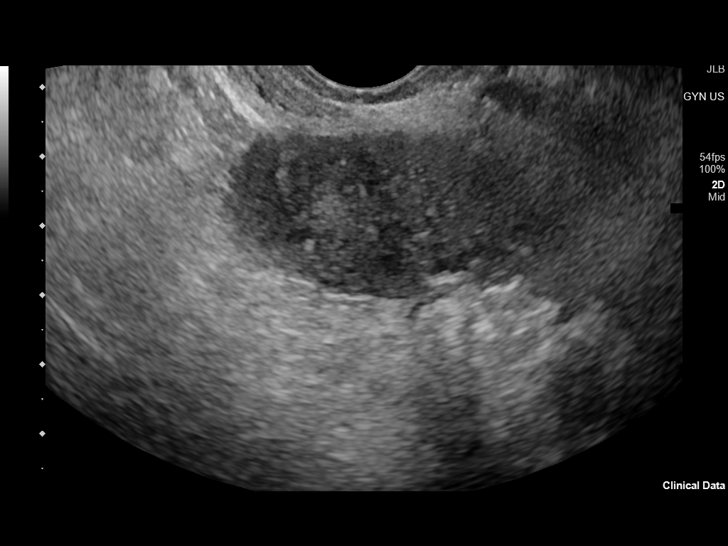
[im 61/81]
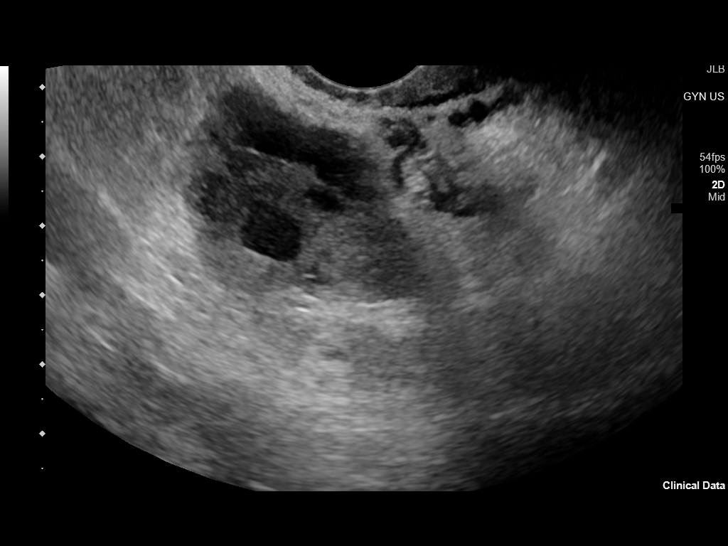
[im 67/81]
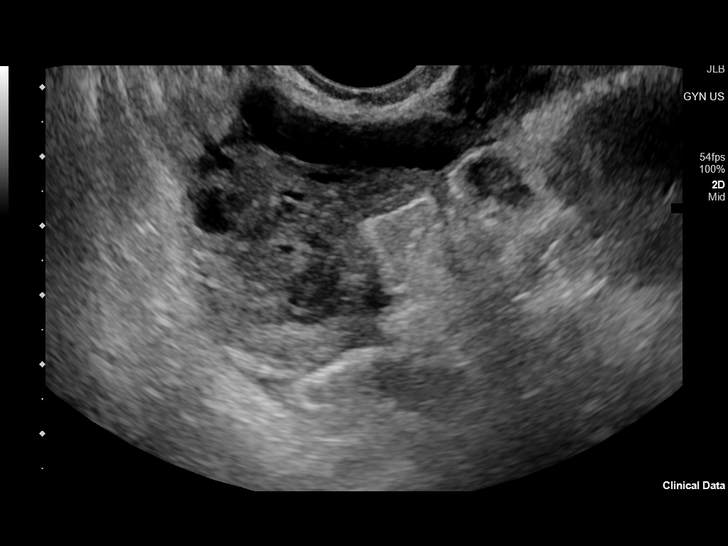
[im 74/81]
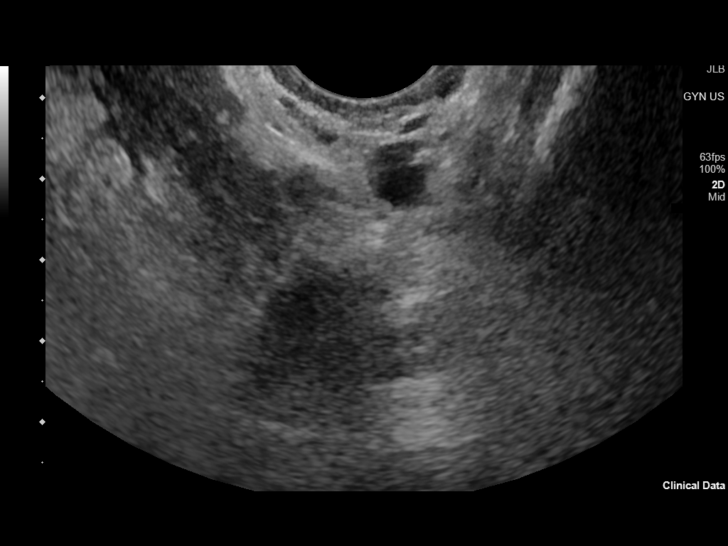
[im 81/81]
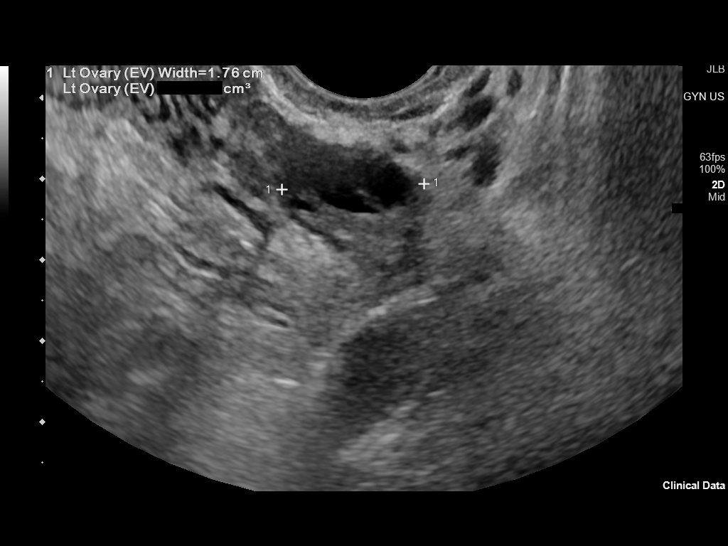

[14 of 25 positions shown; findings below may reference images not displayed]

FINDINGS: Uterus

Measurements: 7.7 x 3.5 x 5.3 cm = volume: 75 mL. No fibroids or
other mass visualized.

Endometrium

Thickness: 0.4 cm. No focal abnormality visualized. IUD is in good
position.

Right ovary

Measurements: 4.1 x 2.8 x 3.6 cm = volume: 21.2 mL. Normal
appearance/no adnexal mass. 3.6 cm simple cyst incidentally noted.

Left ovary

Measurements: 2.3 x 2.0 x 1.8 cm = volume: 4.2 mL. Normal
appearance/no adnexal mass.

Other findings

No abnormal free fluid.
IMPRESSION: Negative exam. No finding to explain the patient's symptoms. IUD is
identified in good position.

## 2020-10-09 MED ORDER — KETOROLAC TROMETHAMINE 30 MG/ML IJ SOLN
30.0000 mg | Freq: Once | INTRAMUSCULAR | Status: AC
  Administered 2020-10-09: 30 mg via INTRAVENOUS

## 2020-10-09 MED ORDER — HYDROCODONE-ACETAMINOPHEN 5-325 MG PO TABS: 1.0000 | ORAL_TABLET | Freq: Four times a day (QID) | ORAL | 0 refills | Status: DC | PRN

## 2020-10-09 NOTE — Progress Notes (Signed)
Pt present for having right abd pain. Pt had ultrasound about a year ago and was reported an ovarian cyst on her right ovary. Pt is having pain in the that area along without irregular cycles.

## 2020-10-09 NOTE — Patient Instructions (Signed)
Pelvic Pain, Female Pelvic pain is pain in your lower belly (abdomen), below your belly button and between your hips. The pain may start suddenly (be acute), keep coming back (be recurring), or last a long time (become chronic). Pelvic pain that lasts longer than 6 months is called chronic pelvic pain. There are many causes of pelvic pain. Sometimes the cause of pelvic pain is not known. Follow these instructions at home:  Take over-the-counter and prescription medicines only as told by your doctor.  Rest as told by your doctor.  Do not have sex if it hurts.  Keep a journal of your pelvic pain. Write down: ? When the pain started. ? Where the pain is located. ? What seems to make the pain better or worse, such as food or your period (menstrual cycle). ? Any symptoms you have along with the pain.  Keep all follow-up visits as told by your doctor. This is important.   Contact a doctor if:  Medicine does not help your pain.  Your pain comes back.  You have new symptoms.  You have unusual discharge or bleeding from your vagina.  You have a fever or chills.  You are having trouble pooping (constipation).  You have blood in your pee (urine) or poop (stool).  Your pee smells bad.  You feel weak or light-headed. Get help right away if:  You have sudden pain that is very bad.  Your pain keeps getting worse.  You have very bad pain and also have any of these symptoms: ? A fever. ? Feeling sick to your stomach (nausea). ? Throwing up (vomiting). ? Being very sweaty.  You pass out (lose consciousness). Summary  Pelvic pain is pain in your lower belly (abdomen), below your belly button and between your hips.  There are many possible causes of pelvic pain.  Keep a journal of your pelvic pain. This information is not intended to replace advice given to you by your health care provider. Make sure you discuss any questions you have with your health care provider. Document  Revised: 02/28/2018 Document Reviewed: 02/28/2018 Elsevier Patient Education  2021 [employer].   Endometriosis  Endometriosis is a condition in which a tissue similar to the endometrium grows in places outside the uterus. The endometrium is a tissue that forms the lining of the uterus. This tissue can grow in the organs that create the eggs (ovaries), the tubes that carry the eggs to the uterus (fallopian tubes), the vagina, and the bowel. This tissue most often grows on the ovaries and inner lining of the pelvic cavity (peritoneum). When the uterus sheds the endometrium every menstrual cycle, there is bleeding wherever these types of tissue are located. This can cause pain because blood is irritating to tissues that are not normally exposed to it. Endometriosis can also make it harder for a woman to get pregnant. What are the causes? The cause of this condition is not known. What increases the risk? The following factors may make you more likely to develop this condition:  Having a family history of endometriosis.  Having never given birth.  Starting your period at 85 years of age or younger. What are the signs or symptoms? Often, there are no symptoms of this condition. If you do have symptoms, they may:  Vary depending on where the abnormal tissue is growing.  Occur during your menstrual period (most often) or at the middle of your cycle.  Come and go. You may have no symptoms during some  months.  Stop when you no longer have your monthly periods (menopause). Symptoms may include:  Pain in the area between your hip bones (pelvis).  Heavier bleeding during periods.  Menstrual periods that happen more than once a month.  Pain during sex.  Pain in the back or abdomen.  Painful bowel movements.  Not being able to get pregnant. How is this diagnosed? This condition is diagnosed based on your symptoms and a physical exam. You may have tests, such as:  Blood tests and  urine tests to help rule out other causes.  Ultrasound to look for tissues that are not normal. This is often done over your skin. It is sometimes done through the vagina (transvaginal).  X-ray of the lower bowel (barium enema).  CT scan.  MRI. To confirm the diagnosis, your health care provider may use a device with a small camera to check tissue inside your abdomen (laparoscopy). Abnormal tissue may be removed and checked in a lab (biopsy). How is this treated? There is no cure for this condition. Treatment focuses on controlling your symptoms. The type of treatment also depends on whether you want to become pregnant in the future. This condition may be treated with:  Medicines. These may include: ? Medicines to relieve pain, including NSAIDs such as ibuprofen. ? Hormone therapy. This uses artificial hormones to slow the growth of the abnormal tissue. This may include hormonal birth control, such as pills.  Surgery to remove the abnormal tissue. During surgery: ? Tissue may be removed using a laparoscope and a laser (laparoscopic laser treatment). ? The fallopian tubes, uterus, and ovaries may be removed (hysterectomy). This is done in very severe cases. Follow these instructions at home:  Get regular exercise.  Limit alcohol use.  Eat a balanced diet.  Avoid caffeine.  Take over-the-counter and prescription medicines only as told by your health care provider.  Keep all follow-up visits as told by your health care provider. This is important. Where to find more information  [employer]: [employer].it  Office on Women's Health: https://www.[place].net/ Contact a health care provider if:  You are having new pain or trouble controlling pain.  You have problems getting pregnant.  You have a fever. Get help right away if you have:  Severe pain that does not get better with medicine.  Severe nausea and vomiting, or if  you cannot eat or drink without vomiting.  Pain that affects your abdomen only on the lower, right side.  Pain in your abdomen that gets worse.  Swelling in your abdomen.  Blood in your stool (feces). Summary  Endometriosis is a condition in which a tissue similar to the endometrium grows in places outside the uterus. The endometrium is a tissue that forms the lining of the uterus.  The cause of this condition is not known.  This condition may be treated with medicines to relieve pain, hormone therapy, or surgery.  If you have this condition, get regular exercise, limit alcohol use, and avoid caffeine.  Get help right away if you have severe pain that does not get better with medicine, or if you have severe nausea and vomiting or blood in your stool. This information is not intended to replace advice given to you by your health care provider. Make sure you discuss any questions you have with your health care provider. Document Revised: 10/30/2019 Document Reviewed: 10/30/2019 Elsevier Patient Education  2021 [employer].

## 2020-10-09 NOTE — Progress Notes (Signed)
GYNECOLOGY PROGRESS NOTE  Subjective:    Patient ID: Veronica Cooke, female    DOB: Feb 18, 1996, 25 y.o.   MRN: [id number]  HPI  Patient is a 25 y.o. [id number] female who presents for complaints of worsening pelvic pain.  She has a history of pelvic floor relaxation with dysfunction and pain for several years (grade 2 possibly grade 3 uterine prolapse with rectocele) as well as progressively worsening cycles and pain with current IUD in place [facility]).  She was referred to pelvic physical therapist after her last visit, however notes that her appointment had to be rescheduled as patient experience COVID-19 infection, with appointment being moved from January 7 out to January 28.  Patient was given a small course of Tylenol #3 to help with the pain associated with her cycles as prescription and OTC Tylenol and ibuprofen were not helping.  She notes that the T#3 helped some but did not completely alleviate her symptoms.  Patient now notes that over the past month she has a secondary pain that feels different from her normal pelvic pain. New pain started (twinging) ~ 1 month ago but rotated from back to right front recently with new onset of bleeding. Had already had a cycle. Now feels like something something is twisting. Tender to touch, can't sit still has to lean to 1 side when sitting.  She does report history of an ovarian cyst in 2020, wonders if she could be developing another one.   The following portions of the patient's history were reviewed and updated as appropriate: allergies, current medications, past family history, past medical history, past social history, past surgical history and problem list.  Review of Systems Pertinent items noted in HPI and remainder of comprehensive ROS otherwise negative.   Objective:   Blood pressure 97/65, pulse 81, height 5\' 4"  (1.626 m), weight 151 lb 4.8 oz (68.6 kg), last menstrual period 09/16/2020. General appearance: alert and mild to moderate  distress Abdomen: normal findings: bowel sounds normal and soft and abnormal findings:  moderate tenderness in the RLQ.  Pelvic: external genitalia normal, rectovaginal septum normal.  Speculum exam not performed due to discomfort.  Uterus mobile, nontender, normal shape and size.  Adnexae with some fullness noted on right side.  Left adnexa nontender, nonpalpable. Extremities: extremities normal, atraumatic, no cyanosis or edema Neurologic: Grossly normal   Assessment:   1. Pelvic pain   2. History of ovarian cyst   3. Pelvic floor relaxation   4. Pelvic floor dysfunction   5. Painful menstruation      Plan:   1.  Pelvic pain -patient with baseline history of pain now with new onset of pain ongoing for approximately a month or so, progressively worsening with a twisting feeling.  In light of patient's previous history of an ovarian cyst, will order stat ultrasound to rule out ovarian cyst and possibly torsion due to the significant nature of patient's pain and description of symptoms.  If no torsion noted and patient does have an ovarian cyst given a sample of combined OCPs to take to help to treat cyst.  Also given a small supply of Norco as patient notes that Tylenol 3 is not helping her pain.  Given 1 dose of IV Toradol 30 mg  in the office.  2. pelvic floor dysfunction with pelvic relaxation -patient ultimately desires hysterectomy however due to age would like for patient to attempt other modalities first, including physical therapy.  Her appointment was moved due to recent history  of COVID infection.  We will follow-up after patient has at least 1-2 appointments to reassess symptoms. 3. further discussion had with patient regarding her menstrual cycles and pelvic pain.  I discussed that although she does have pelvic relaxation which could be a cause of discomfort it usually is not a source of significant pain.  My concern may be that she may actually be dealing with endometriosis.   Patient notes that she has had that concern for some time now after reading the possible symptoms.  Advised that gold standard for diagnosis would be a diagnostic laparoscopy, however in light of patient's desire to have more definitive surgery and not undergo surgical intervention twice, will attempt to see if symptoms can be managed medically empirically prior to progression to surgical intervention.  Discussed option of trial of Orilissa.  Patient notes that she would be willing to try.  Advised to initiate after completion of birth control for treatment of her ovarian cyst if confirmed on ultrasound.  If no cyst noted, patient is able to start Dewayne Hatch now.   A total of 25 minutes were spent face-to-face with the patient during this encounter and over half of that time involved counseling and coordination of care.   Hildred Laser, MD [facility]

## 2020-10-10 ENCOUNTER — Encounter: Payer: Self-pay | Admitting: Obstetrics and Gynecology

## 2020-10-15 ENCOUNTER — Ambulatory Visit: Payer: Medicaid Other | Admitting: Physical Therapy

## 2020-10-22 ENCOUNTER — Encounter: Payer: Self-pay | Admitting: Physical Therapy

## 2020-10-22 ENCOUNTER — Ambulatory Visit: Payer: Medicaid Other | Attending: Obstetrics and Gynecology | Admitting: Physical Therapy

## 2020-10-22 ENCOUNTER — Other Ambulatory Visit: Payer: Self-pay

## 2020-10-22 DIAGNOSIS — M545 Low back pain, unspecified: Secondary | ICD-10-CM | POA: Insufficient documentation

## 2020-10-22 DIAGNOSIS — M533 Sacrococcygeal disorders, not elsewhere classified: Secondary | ICD-10-CM | POA: Insufficient documentation

## 2020-10-22 DIAGNOSIS — G8929 Other chronic pain: Secondary | ICD-10-CM

## 2020-10-22 DIAGNOSIS — M6208 Separation of muscle (nontraumatic), other site: Secondary | ICD-10-CM | POA: Insufficient documentation

## 2020-10-22 NOTE — Patient Instructions (Addendum)
icnrease water from 8 fl oz to 24 fl ( 3 glasses on the counter)  Try honey or lemon or mint in water , room temp first time before coffee to cleanse  Stay hydrated for bladder health and minimize constipation which will help with minimizing worsening of prolapse   Decreased coffee or ginger ale  from 16 oz to 12 oz to aim for 2:1 ratio of bladder irritants to water   ( See bladder irritant sheet)   __    . Proper body mechanics with getting out of a chair to decrease strain  on back &pelvic floor   Avoid holding your breath when Getting out of the chair:  Scoot to front part of chair chair Heels behind feet, feet are hip width apart, nose over toes  Inhale like you are smelling roses Exhale to stand    ____  Minisquat: Scoot buttocks back slight, hinge like you are looking at your reflection on a pond  Knees behind toes,  Inhale to "smell flowers"  Exhale on the rise "like rocket"  Do not lock knees, have more weight across ballmounds of feet, toes relaxed   When lifting daughter   ___ 3 x day   Clam Shell 45 Degrees  Lying with hips and knees bent 45, one pillow between knees and ankles. Heel together, toes apart like ballerina,  Lift knee with exhale while pressing heels together. Be sure pelvis does not roll backward. Do not arch back. Do 20 times, each leg, 2 times per day.   Adductor squeeze on back with knees bend, pillow between knees ( fold it) knees as wide as hips  Inhale, exhale count aloud 5 sec,   10 reps
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
## 2020-10-27 ENCOUNTER — Other Ambulatory Visit: Payer: Self-pay

## 2020-10-29 ENCOUNTER — Other Ambulatory Visit: Payer: Self-pay | Admitting: Obstetrics and Gynecology

## 2020-10-29 ENCOUNTER — Ambulatory Visit: Payer: Medicaid Other | Admitting: Physical Therapy

## 2020-10-29 MED ORDER — HYDROCODONE-ACETAMINOPHEN 5-325 MG PO TABS: 1.0000 | ORAL_TABLET | Freq: Four times a day (QID) | ORAL | 0 refills | Status: DC | PRN

## 2020-11-05 ENCOUNTER — Other Ambulatory Visit: Payer: Self-pay

## 2020-11-05 ENCOUNTER — Ambulatory Visit: Payer: Medicaid Other | Attending: Obstetrics and Gynecology | Admitting: Physical Therapy

## 2020-11-05 DIAGNOSIS — G8929 Other chronic pain: Secondary | ICD-10-CM | POA: Diagnosis present

## 2020-11-05 DIAGNOSIS — M533 Sacrococcygeal disorders, not elsewhere classified: Secondary | ICD-10-CM | POA: Diagnosis not present

## 2020-11-05 DIAGNOSIS — M545 Low back pain, unspecified: Secondary | ICD-10-CM | POA: Insufficient documentation

## 2020-11-05 DIAGNOSIS — M6208 Separation of muscle (nontraumatic), other site: Secondary | ICD-10-CM | POA: Insufficient documentation
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
## 2020-11-05 NOTE — Patient Instructions (Addendum)
Deep core level 1 and 2    Wear SIJ during the day    Scooting in bed with points of contact

## 2020-11-12 ENCOUNTER — Other Ambulatory Visit: Payer: Self-pay

## 2020-11-12 ENCOUNTER — Ambulatory Visit: Payer: Medicaid Other | Admitting: Physical Therapy

## 2020-11-12 DIAGNOSIS — M545 Low back pain, unspecified: Secondary | ICD-10-CM

## 2020-11-12 DIAGNOSIS — G8929 Other chronic pain: Secondary | ICD-10-CM

## 2020-11-12 DIAGNOSIS — M533 Sacrococcygeal disorders, not elsewhere classified: Secondary | ICD-10-CM | POA: Diagnosis not present

## 2020-11-12 DIAGNOSIS — M6208 Separation of muscle (nontraumatic), other site: Secondary | ICD-10-CM

## 2020-11-12 NOTE — Patient Instructions (Signed)
Shop for tennis shoe with good sole support , good lace support   Wear tennis shoes and SIJ belt  when walking for long periods  GOOD JOB on building endurance with walking long distances   ___   Prone Heel Press for strengthening sacro-iliac joints  1. Lie on your belly. If you have an arch in your low back or it feels umcomfortable, place a pillow under your low belly/hips to make sure your low back feel comfortable.   2. Place our forehead on top of your palms.      Widen your knees apart for starting position.   3. Inhale, feel belly and low back expand  4. Exhale, feel belly hug in, press heel together and count aloud for 5 sec. Then relax the heel squeezing.  Perform 10 reps of 5 sec holds. 2 sets/ day.    If you feel entire buttock tighten too much or feel low back pain, apply 50% less effort. As you press your heel together, you will feel as if your pubic bone (front of your pelvis) and sacrum (back of your pelvis) gentle move towards each other or your low abdominal muscles hug in more.   ___  childs poses rocking with pillow under belly if need more support/ relaxation  childs poses rocking   Toes tucked, shoulders down and back, on forearms , hands shoulder width apart  10 reps
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
## 2020-11-15 ENCOUNTER — Other Ambulatory Visit: Payer: Self-pay

## 2020-11-16 MED ORDER — HYDROCODONE-ACETAMINOPHEN 5-325 MG PO TABS: 1.0000 | ORAL_TABLET | Freq: Four times a day (QID) | ORAL | 0 refills | Status: DC | PRN

## 2020-11-19 ENCOUNTER — Ambulatory Visit: Payer: Medicaid Other | Admitting: Physical Therapy

## 2020-11-19 ENCOUNTER — Other Ambulatory Visit: Payer: Self-pay

## 2020-11-19 DIAGNOSIS — M533 Sacrococcygeal disorders, not elsewhere classified: Secondary | ICD-10-CM

## 2020-11-19 DIAGNOSIS — M545 Low back pain, unspecified: Secondary | ICD-10-CM

## 2020-11-19 DIAGNOSIS — G8929 Other chronic pain: Secondary | ICD-10-CM

## 2020-11-19 DIAGNOSIS — M6208 Separation of muscle (nontraumatic), other site: Secondary | ICD-10-CM
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
## 2020-11-19 NOTE — Patient Instructions (Addendum)
SWIMMERS   Inhale Exhale.   L Arm and chest lift off bed slightly, arm is "V" , chin tucked  Draw shoulders away from ears  R thigh and leg up , knee straight   L/R = 1 rep 10 reps X 2 day   ___   .Multifidis twist  SEATED  Red band  Band is on doorknob: stand further away from door (facing perpendicular)   Twisting trunk without moving the hips and knees Hold band at the level of ribcage, elbows bent,shoulder blades roll back and down like squeezing a pencil under armpit    Exhale twist,.10-15 deg away from door without moving your hips/ knees. Continue to maintain equal weight through legs. Keep knee unlocked.  10 x 2

## 2020-11-26 ENCOUNTER — Other Ambulatory Visit: Payer: Self-pay

## 2020-11-26 ENCOUNTER — Ambulatory Visit: Payer: Medicaid Other | Attending: Obstetrics and Gynecology | Admitting: Physical Therapy

## 2020-11-26 DIAGNOSIS — M6208 Separation of muscle (nontraumatic), other site: Secondary | ICD-10-CM | POA: Insufficient documentation

## 2020-11-26 DIAGNOSIS — G8929 Other chronic pain: Secondary | ICD-10-CM | POA: Diagnosis present

## 2020-11-26 DIAGNOSIS — M545 Low back pain, unspecified: Secondary | ICD-10-CM | POA: Insufficient documentation

## 2020-11-26 DIAGNOSIS — M533 Sacrococcygeal disorders, not elsewhere classified: Secondary | ICD-10-CM | POA: Insufficient documentation

## 2020-11-26 NOTE — Patient Instructions (Addendum)
Improving water intake will be helpful for bowels and bladder   Currently water:  16 fl oz  one water 16 floz. a day,  : bladder irritant 48 fl oz total   32 fl oz of Sprite, 16 fl oz coffee,   ___  Try to increase water from 16 fl oz to 32 fl oz  ( 2 bottle) and decrease soda  from 2 cans to 1   New ratio this week 32 fl of water, 32 fl of bladder irritant   Eventually try for water twice as much as bladder irritant    ___    Stretch for pelvic floor   V- slides  "v heels slide away and then back toward buttocks and then rock knee to slight ,  slide heel along at 11 o clock away from buttocks   10 reps     On belly: Riding horse edge of mattress  knee bent like riding a horse, move knee towards armpit and out  10 reps    Mermaid stretch  Rocking while seated on the floor with heels to one side of the hip Heels to one side of the hip  Rock forward towards the knee that is bent , rock beck towards the opposite sitting bones   childs poses rocking with pillow under belly if need more support/ relaxation Toes tucked, shoulders down and back, on forearms , hands shoulder width apart  10 reps    Restorative pose: for when feeling front pelvic pain to open and expand with breath and rest , expands pelvic floor  Bolster and pillow under belly, knees wide, rolled blanket under back of thighs,  10 min
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
## 2020-12-04 ENCOUNTER — Ambulatory Visit: Payer: Medicaid Other | Admitting: Physical Therapy

## 2020-12-04 ENCOUNTER — Other Ambulatory Visit: Payer: Self-pay

## 2020-12-04 DIAGNOSIS — M6208 Separation of muscle (nontraumatic), other site: Secondary | ICD-10-CM

## 2020-12-04 DIAGNOSIS — G8929 Other chronic pain: Secondary | ICD-10-CM

## 2020-12-04 DIAGNOSIS — M545 Low back pain, unspecified: Secondary | ICD-10-CM

## 2020-12-04 DIAGNOSIS — M533 Sacrococcygeal disorders, not elsewhere classified: Secondary | ICD-10-CM

## 2020-12-04 NOTE — Patient Instructions (Addendum)
  Proper body mechanics with getting out of a chair to decrease strain  on back &pelvic floor   Avoid holding your breath when Getting out of the chair:  Scoot to front part of chair chair Heels behind knees, feet are hip width apart, nose over toes  Inhale like you are smelling roses Exhale to stand    _______    Lying on back, knees bent    band under ballmounds  while laying on back w/ knees bent  "W" exercise  10 reps x 2 sets   Band is placed under feet, knees bent, feet are hip width apart Hold band with thumbs point out, keep upper arm and elbow touching the bed the whole time  - inhale and then exhale pull bands by bending elbows hands move in a "w"  (feel shoulder blades squeezing)     __________________________  Oblique/ scapula stabilization   Opposite arm   Place band in "U"    band under ballmounds  while laying on back w/ knees bent     20 reps  on each side  Holding band from opposite thigh,  Inhale,    exhale then pull band across body while keeping elbow , shoulders, back of the head pressed down    ______________    3- foot tap  10 reps  Each side   Hold onto wall   Slightly bend of standing knee, and keep hips above foot   ballmound of opposite leg   taps to each direction and   back to spot under hips- notice equal pressure through both legs, and across ballmound and heels    ___
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
## 2020-12-10 ENCOUNTER — Ambulatory Visit: Payer: Medicaid Other | Admitting: Physical Therapy

## 2020-12-10 ENCOUNTER — Other Ambulatory Visit: Payer: Self-pay

## 2020-12-10 DIAGNOSIS — M545 Low back pain, unspecified: Secondary | ICD-10-CM | POA: Diagnosis not present

## 2020-12-10 DIAGNOSIS — M6208 Separation of muscle (nontraumatic), other site: Secondary | ICD-10-CM

## 2020-12-10 DIAGNOSIS — G8929 Other chronic pain: Secondary | ICD-10-CM

## 2020-12-10 NOTE — Patient Instructions (Signed)
Gentle pelvic tilts to find anterior tilt to relax top front pelvic muscles  Sit with all points of contact     Practice proper pelvic floor coordination with deep core levl 1 -2 and find anterior pelvic tilt first inhale: expand ribcage and pelvic floor muscles Exhale" "j" scoop, allow pelvic floor to close, lift first before belly sinks   ( not "draw abdominal muscle to spine" or strain with abdominal muscles")    ---  Attached is the body scan technique. Keep up with your relaxation practice!   Below are apps for calming guided relaxation rpactices  https://insighttimer.app.link/E9MU8LLqG6?_p=[email or web address]   I know off another person: Helmut Muster who has singing bowl recordings  Physiological scientist  Insight Timer  Below is the research : Article on mindfulness and pain: TripleFare.com.cy  Using Mindfulness to Approach Chronic Pain  Psych [employer] SymphonyMagazine.at When we're in pain, we want it to go away. Immediately. And that's understandable. Chronic pain is frustrating and debilitating, said Serena Croissant, Ph.D, a ...      Below are research articles about pain science. The same part of the brain is responsible for anxiety as well as mindfulness! I like to look at it like the [employer] poem "The road not taken".  Retraining the brain is exercise too.  http://www.painresearchforum.org/news/49812-synaptic-mechanism-may-link-pain-anxiety-brain  Synaptic Mechanism May Link Pain, Anxiety in Brain  Pain .Marland Kitchen. www.painresearchforum.org Like a thorny vine climbing along a wrought iron gate, pain and anxiety are inextricably intertwined. Anxiety disorders often develop in people with chronic pain, and ..      http://www.nguyen-heath.com/    https://king-bowman.com/  More audio files
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
## 2020-12-17 ENCOUNTER — Ambulatory Visit: Payer: Medicaid Other | Admitting: Physical Therapy

## 2020-12-17 ENCOUNTER — Other Ambulatory Visit: Payer: Self-pay

## 2020-12-17 DIAGNOSIS — M6208 Separation of muscle (nontraumatic), other site: Secondary | ICD-10-CM

## 2020-12-17 DIAGNOSIS — M533 Sacrococcygeal disorders, not elsewhere classified: Secondary | ICD-10-CM

## 2020-12-17 DIAGNOSIS — M545 Low back pain, unspecified: Secondary | ICD-10-CM | POA: Diagnosis not present

## 2020-12-17 DIAGNOSIS — G8929 Other chronic pain: Secondary | ICD-10-CM
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
## 2020-12-25 ENCOUNTER — Ambulatory Visit: Payer: Medicaid Other | Attending: Obstetrics and Gynecology | Admitting: Physical Therapy

## 2020-12-25 ENCOUNTER — Other Ambulatory Visit: Payer: Self-pay

## 2020-12-25 DIAGNOSIS — M6208 Separation of muscle (nontraumatic), other site: Secondary | ICD-10-CM | POA: Insufficient documentation

## 2020-12-25 DIAGNOSIS — G8929 Other chronic pain: Secondary | ICD-10-CM | POA: Insufficient documentation

## 2020-12-25 DIAGNOSIS — M545 Low back pain, unspecified: Secondary | ICD-10-CM | POA: Diagnosis not present

## 2020-12-25 DIAGNOSIS — M533 Sacrococcygeal disorders, not elsewhere classified: Secondary | ICD-10-CM | POA: Insufficient documentation

## 2020-12-25 NOTE — Patient Instructions (Signed)
Locust pose  Pillow under hips if needed for decreased low back pain  Palms face midline by hips  Finger tips shooting straight down  Imagine holding pencil under your armpits Draw shoulders away from ears Inhale Exhale lift chest up slightly without feeling it in your back. The bend happens in the midback  (keep chin tucked)   10 reps  __   Multifidis twist  Band is on doorknob: stand further away from door (facing perpendicular)   Twisting trunk without moving the hips and knees Hold band at the level of ribcage, elbows bent,shoulder blades roll back and down like squeezing a pencil under armpit    Exhale twist,.10-15 deg away from door without moving your hips/ knees. Continue to maintain equal weight through legs. Keep knee unlocked.  10 x 2   __  SLEEP on back  Try to convert from belly sleep, Train the body to comfort by the throw pillows under arms
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
## 2020-12-31 ENCOUNTER — Other Ambulatory Visit: Payer: Self-pay

## 2020-12-31 ENCOUNTER — Ambulatory Visit: Payer: Medicaid Other | Admitting: Physical Therapy

## 2020-12-31 DIAGNOSIS — M545 Low back pain, unspecified: Secondary | ICD-10-CM | POA: Diagnosis not present

## 2020-12-31 DIAGNOSIS — M6208 Separation of muscle (nontraumatic), other site: Secondary | ICD-10-CM

## 2020-12-31 DIAGNOSIS — G8929 Other chronic pain: Secondary | ICD-10-CM

## 2020-12-31 DIAGNOSIS — M533 Sacrococcygeal disorders, not elsewhere classified: Secondary | ICD-10-CM
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
## 2021-01-07 ENCOUNTER — Ambulatory Visit: Payer: Medicaid Other | Admitting: Physical Therapy

## 2021-01-07 ENCOUNTER — Other Ambulatory Visit: Payer: Self-pay

## 2021-01-07 DIAGNOSIS — M545 Low back pain, unspecified: Secondary | ICD-10-CM | POA: Diagnosis not present

## 2021-01-07 DIAGNOSIS — M533 Sacrococcygeal disorders, not elsewhere classified: Secondary | ICD-10-CM

## 2021-01-07 DIAGNOSIS — G8929 Other chronic pain: Secondary | ICD-10-CM

## 2021-01-07 DIAGNOSIS — M6208 Separation of muscle (nontraumatic), other site: Secondary | ICD-10-CM

## 2021-01-07 NOTE — Patient Instructions (Signed)
Winging spread with green band , behind shoulders,   10 reps on exhale with proper pelvic tilt but no lumbar arching  In seated position   __  Minisquat: Scoot buttocks back slight, hinge like you are looking at your reflection on a pond  Knees behind toes,  Inhale to "smell flowers"  Exhale on the rise "like rocket"  Do not lock knees, have more weight across ballmounds of feet, toes relaxed   10 reps x 3 x day
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
## 2021-01-15 ENCOUNTER — Encounter: Payer: Medicaid Other | Admitting: Physical Therapy

## 2021-01-21 ENCOUNTER — Telehealth: Payer: Self-pay | Admitting: Obstetrics and Gynecology

## 2021-01-21 ENCOUNTER — Ambulatory Visit: Payer: Medicaid Other | Admitting: Physical Therapy

## 2021-01-21 DIAGNOSIS — G8929 Other chronic pain: Secondary | ICD-10-CM

## 2021-01-21 DIAGNOSIS — M6208 Separation of muscle (nontraumatic), other site: Secondary | ICD-10-CM

## 2021-01-21 DIAGNOSIS — M533 Sacrococcygeal disorders, not elsewhere classified: Secondary | ICD-10-CM

## 2021-01-21 DIAGNOSIS — M545 Low back pain, unspecified: Secondary | ICD-10-CM

## 2021-01-21 NOTE — Telephone Encounter (Addendum)
Physical therapist called regarding patient's symptoms, states that she was doing well for a few weeks then had onset of lower abdomin /pelvic pain. Patient describes as radiating pain around hip. Pt could not move for 2 days and could not walk- states it did get better when she slept, today the pain is worse and pt did consider going to ER. PT states that pt is in pain with resistant movement and lifting leg. Patient states that she has been spotting every 2 weeks for the past 2 months- currently spotting for past 2 days. PT is concerned this is not intramuscular condition and is wanting to see if pt can be seen sooner than later.   PT wanted to add that during the peak of pain patient had no appetite, feels like she has been running fever but no fever according to thermometer, lightheaded- BP is normal.   Best contact #: [phone]

## 2021-01-22 ENCOUNTER — Telehealth: Payer: Self-pay | Admitting: Obstetrics and Gynecology

## 2021-01-22 ENCOUNTER — Other Ambulatory Visit: Payer: Self-pay | Admitting: Obstetrics and Gynecology

## 2021-01-22 DIAGNOSIS — N946 Dysmenorrhea, unspecified: Secondary | ICD-10-CM

## 2021-01-22 DIAGNOSIS — M6289 Other specified disorders of muscle: Secondary | ICD-10-CM

## 2021-01-22 DIAGNOSIS — R102 Pelvic and perineal pain unspecified side: Secondary | ICD-10-CM

## 2021-01-22 NOTE — Telephone Encounter (Signed)
I will have her to be scheduled with a pelvic pain specialist doctor. I know her desire is a hysterectomy and if they agree at this time, then I will be more than happy to perform.

## 2021-01-22 NOTE — Telephone Encounter (Signed)
Please advise. Thanks Seana Underwood 
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
## 2021-01-22 NOTE — Telephone Encounter (Signed)
I received a message from her PT.  I advised on sending her to a pelvic floor specialist who also specializes in surgical interventions. They are with [facility]. I placed an order.

## 2021-01-22 NOTE — Telephone Encounter (Signed)
Please advise. Thanks Rylon Poitra 

## 2021-01-22 NOTE — Telephone Encounter (Signed)
Patient called following up on message yesterday- made pt aware Dr. Valentino Saxon has been in clinic all morning and to please allow time for clinical staff to address and reply. Pt was understanding.

## 2021-01-22 NOTE — Telephone Encounter (Signed)
Spoke to pt concerning her call to the office. Pt stated that she has been seeing someone for PT for pelvic floor for about 2 months now and has an appt every 2 weeks up to June. Pt is not sure what her next steps are. I asked pt what did she want from [facility]. Pt stated that she was not sure. I asked pt if she wanted [facility] to review notes from PT or speak to PT to see what the next step is for her. Pt stated I guess.

## 2021-01-25 ENCOUNTER — Telehealth: Payer: Self-pay | Admitting: Physical Therapy

## 2021-01-25 NOTE — Telephone Encounter (Signed)
Spoke to pt concerning her call to the office. Pt was advised and informed of the information given by [facility]. Pt is aware that [facility] has faxed her medical records to the new PT office this morning. Pt was advised to please allow them 4-5 business days to reach out to her. Pt was advised that if they do not reach out to her in 5 days to please contact the office. Pt voiced that she understood.

## 2021-01-25 NOTE — Telephone Encounter (Signed)
Physical therapist called pt to f/u on her acute pelvic pain that she reported 4/28 at her last PT appt. Pt stated on the call that the front pelvic pain continues to persistent and she is not able to walk, pain includes spasms. Pt also has felt nausea,  Light headed due to not able to take down fluids. She continues to report menstrual spotting. She said this pelvic pain is different from what she had experienced prior to coming to Pelvic PT.  Her gynecologist office notified her that her doctor has an opening May 4 and she is getting referred to pain specialist at [facility]. Physical therapist advised pt to go to the ER. Pt voiced agreement.

## 2021-01-25 NOTE — Telephone Encounter (Signed)
Addendum to telephone note with pt:   Physical therapist advised pt to go to the ER due to persistent of acute pelvic pain that started one week ago and is accompanied with nausea, lightheadedness. Pt had menstrual spotting for the past 3 months and a Hx of ovarian cyst. Pt agreed to go to ER.

## 2021-01-27 NOTE — Telephone Encounter (Signed)
Spoke to pt on Monday and informed her of the information given by [facility]. Pt is aware that [facility] has placed an PT order to [facility] for the pt.

## 2021-02-05 ENCOUNTER — Ambulatory Visit: Payer: Medicaid Other | Admitting: Physical Therapy

## 2021-02-11 ENCOUNTER — Telehealth: Payer: Self-pay | Admitting: Obstetrics and Gynecology

## 2021-02-11 ENCOUNTER — Other Ambulatory Visit: Payer: Self-pay | Admitting: Obstetrics and Gynecology

## 2021-02-11 DIAGNOSIS — R102 Pelvic and perineal pain: Secondary | ICD-10-CM

## 2021-02-11 NOTE — Telephone Encounter (Signed)
Pt states that she had consult today and they are recommending a STAT US-  Within 7 days and if not able to go to ER. STAT US to evaluate ovarian cyst, right. And right lower quad for pain. Please Advise.

## 2021-02-15 ENCOUNTER — Other Ambulatory Visit: Payer: Self-pay

## 2021-02-15 DIAGNOSIS — Z8742 Personal history of other diseases of the female genital tract: Secondary | ICD-10-CM

## 2021-02-15 DIAGNOSIS — M6289 Other specified disorders of muscle: Secondary | ICD-10-CM

## 2021-02-15 DIAGNOSIS — R102 Pelvic and perineal pain: Secondary | ICD-10-CM

## 2021-02-15 DIAGNOSIS — N946 Dysmenorrhea, unspecified: Secondary | ICD-10-CM

## 2021-02-16 ENCOUNTER — Other Ambulatory Visit: Payer: Self-pay

## 2021-02-16 ENCOUNTER — Ambulatory Visit (HOSPITAL_BASED_OUTPATIENT_CLINIC_OR_DEPARTMENT_OTHER)
Admission: RE | Admit: 2021-02-16 | Discharge: 2021-02-16 | Disposition: A | Discharge status: Home / Self Care | Payer: Medicaid Other | Source: Ambulatory Visit | Attending: Obstetrics and Gynecology | Admitting: Obstetrics and Gynecology

## 2021-02-16 DIAGNOSIS — R102 Pelvic and perineal pain: Secondary | ICD-10-CM | POA: Diagnosis not present

## 2021-02-16 DIAGNOSIS — Z8742 Personal history of other diseases of the female genital tract: Secondary | ICD-10-CM | POA: Insufficient documentation

## 2021-02-16 DIAGNOSIS — N946 Dysmenorrhea, unspecified: Secondary | ICD-10-CM | POA: Insufficient documentation

## 2021-02-16 DIAGNOSIS — M6289 Other specified disorders of muscle: Secondary | ICD-10-CM | POA: Insufficient documentation

## 2021-02-16 IMAGING — US US PELVIS COMPLETE WITH TRANSVAGINAL
1 series · 13 of 25 positions shown · non-contrast
Comparison: [DATE]

CLINICAL DATA: Pelvic pain on RIGHT for 6-12 months, LMP
[DATE], history of ovarian cyst and pelvic floor dysfunction

EXAM:
TRANSABDOMINAL AND TRANSVAGINAL ULTRASOUND OF PELVIS
TECHNIQUE: Both transabdominal and transvaginal ultrasound examinations of the
pelvis were performed. Transabdominal technique was performed for
global imaging of the pelvis including uterus, ovaries, adnexal
regions, and pelvic cul-de-sac. It was necessary to proceed with
endovaginal exam following the transabdominal exam to visualize the
endometrium, lower uterus, and adnexa.

[Series 1: us pelvic complete with transvaginal · 95 acquisitions, 13 frames shown]
[im 1/95]
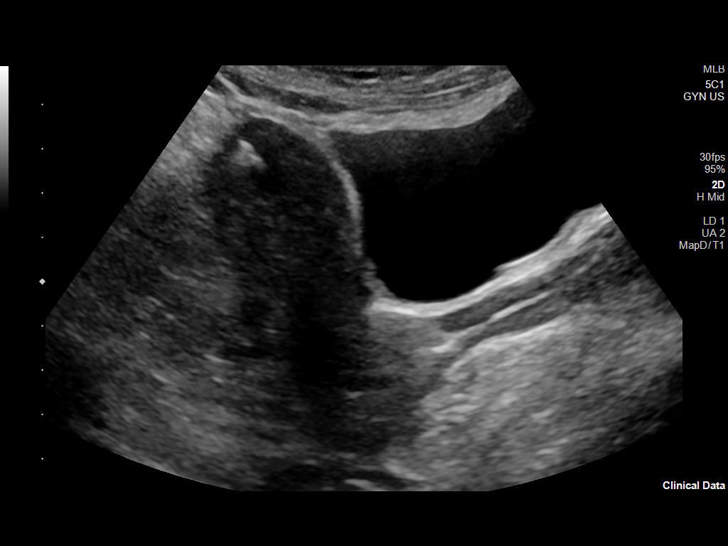
[im 8/95]
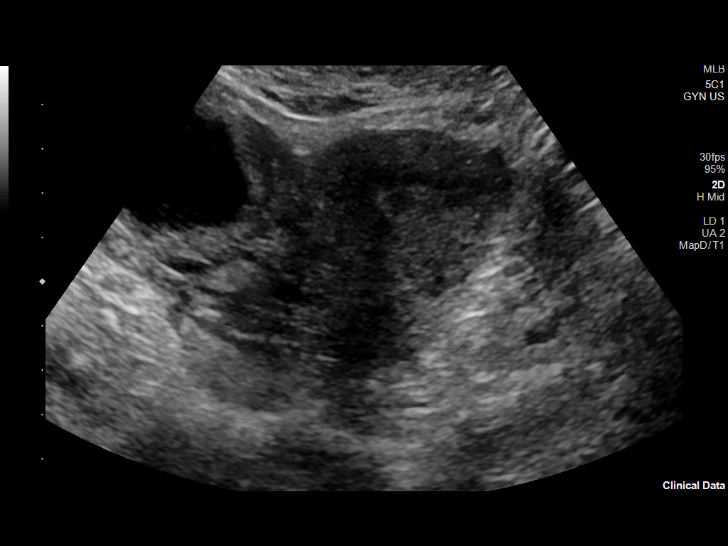
[im 16/95]
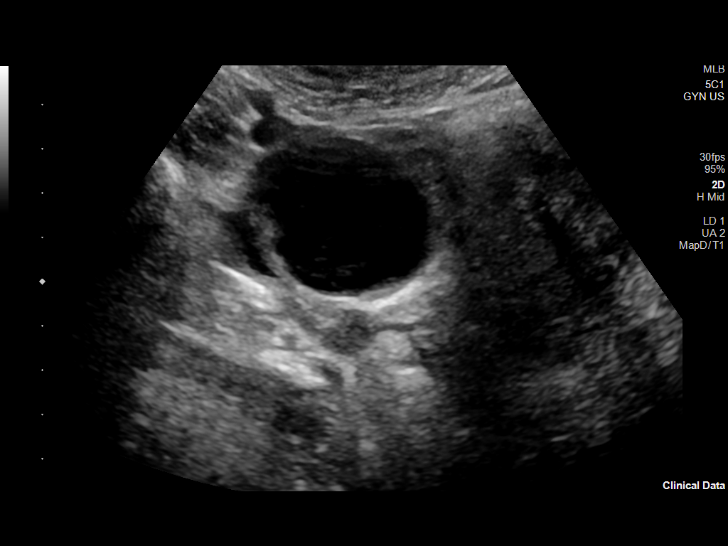
[im 24/95]
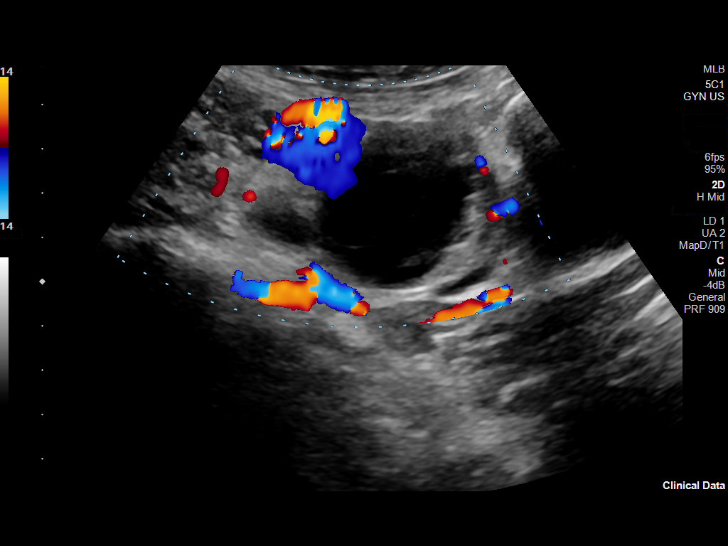
[im 32/95]
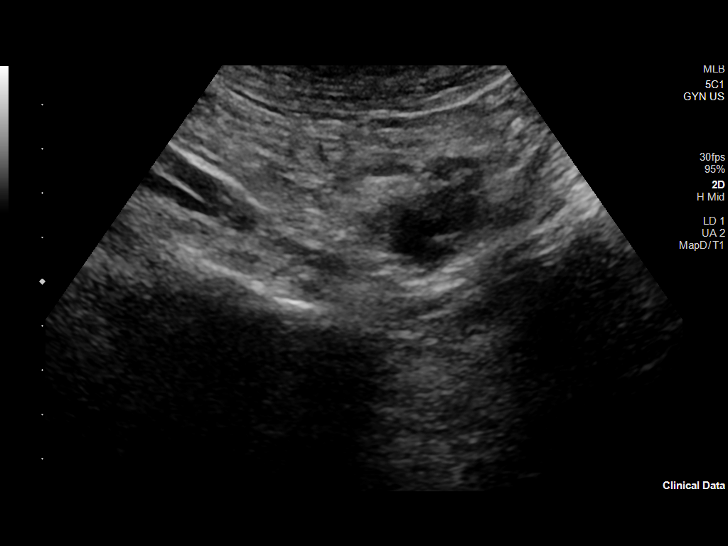
[im 40/95]
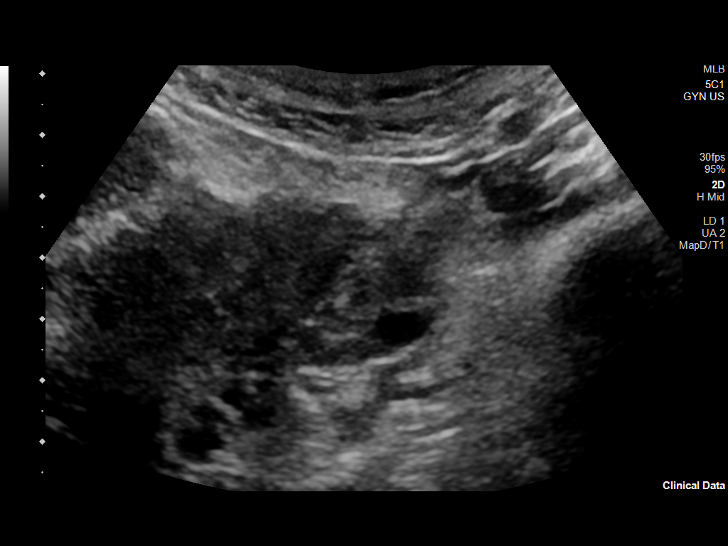
[im 48/95]
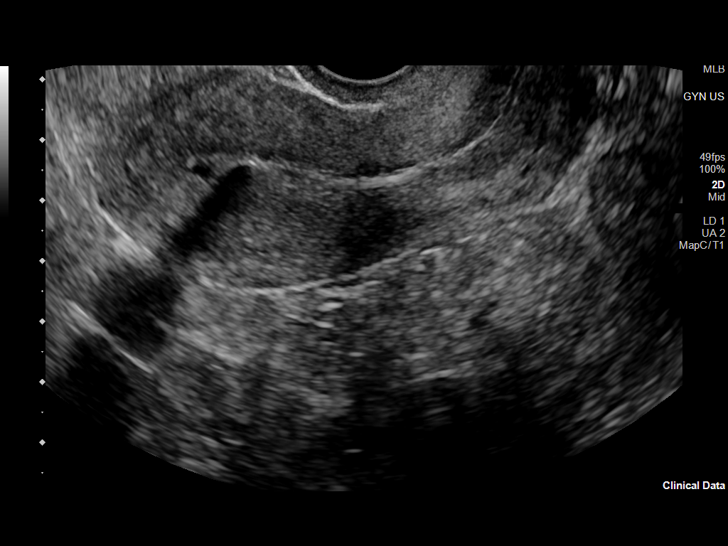
[im 55/95]
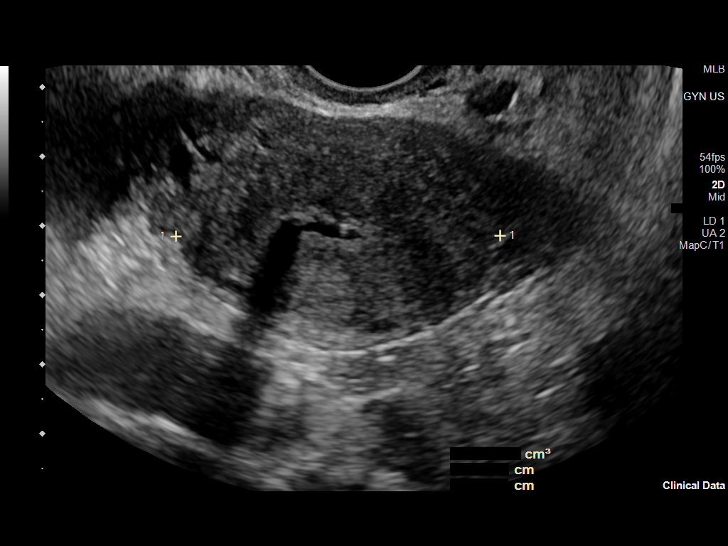
[im 63/95]
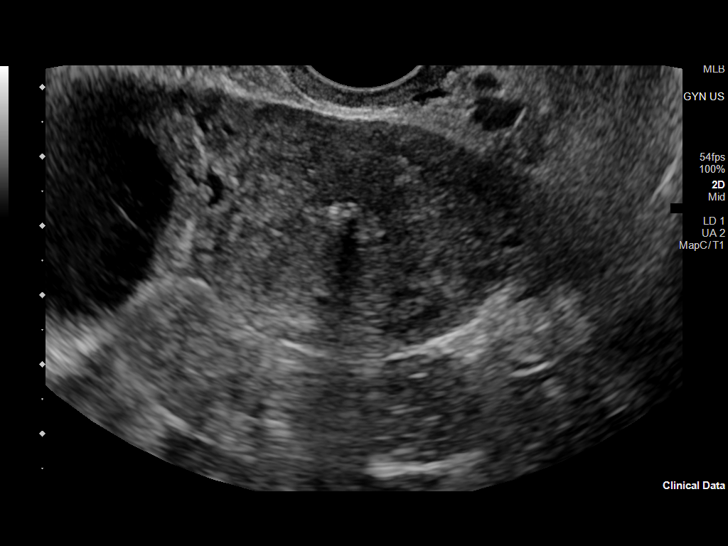
[im 71/95]
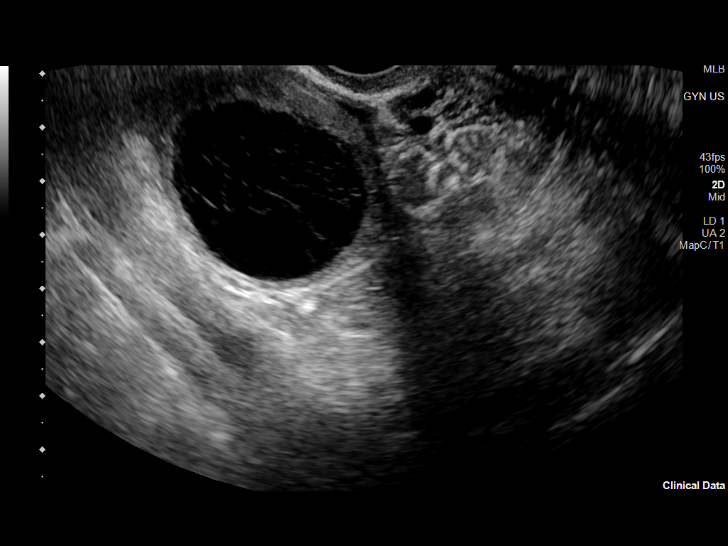
[im 79/95]
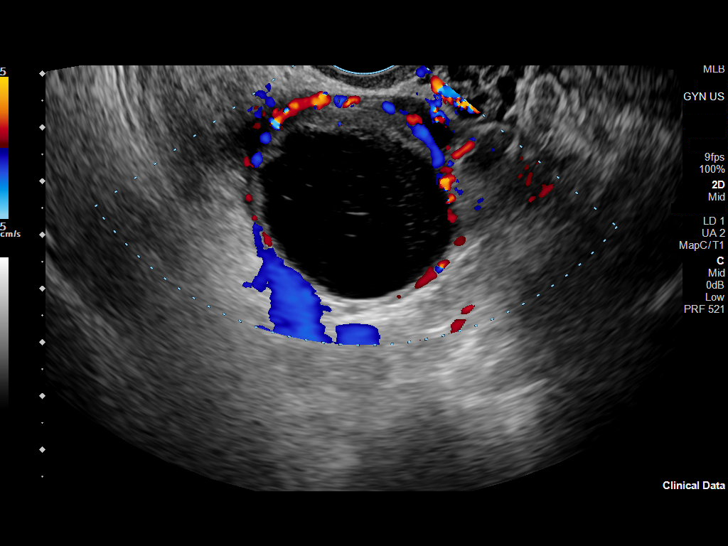
[im 87/95]
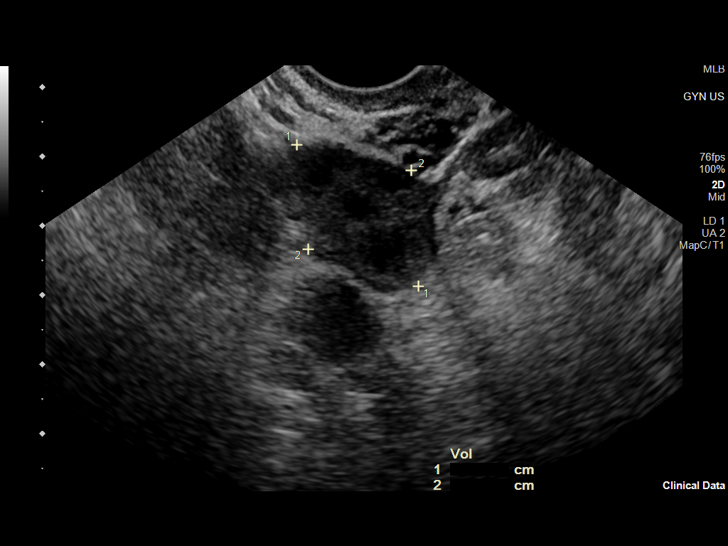
[im 95/95]
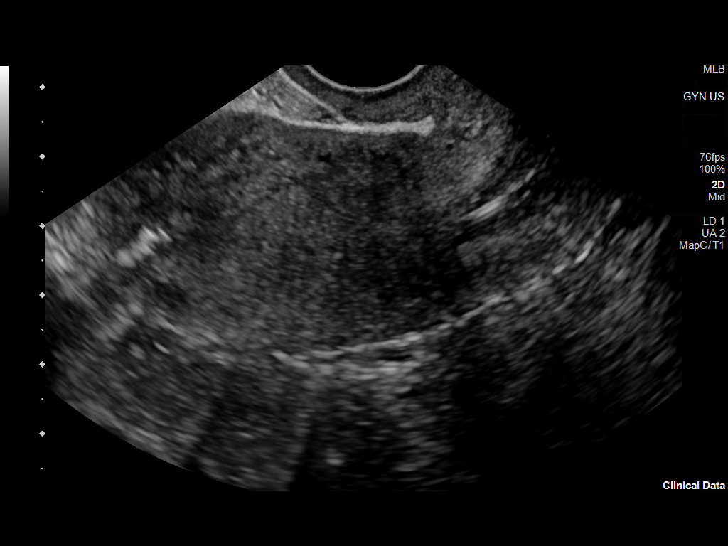

[13 of 25 positions shown; findings below may reference images not displayed]

FINDINGS: Uterus

Measurements: 8.5 x 3.6 x 4.7 cm = volume: 75 mL. Anteverted. Normal
morphology without mass

Endometrium

Thickness: 6 mm. IUD in expected position at upper uterine segment
endometrial canal. Trace endometrial fluid. No focal endometrial
mass.

Right ovary

Measurements: 4.6 x 4.4 x 4.5 cm = volume: 47 mL. Hemorrhagic cyst
within RIGHT ovary 4.2 cm in greatest diameter; no follow-up imaging
recommended.

Left ovary

Measurements: 2.7 x 1.9 x 1.5 cm = volume: 3.9 mL. Normal morphology
without mass

Other findings

Trace free pelvic fluid.  No adnexal masses otherwise seen.
IMPRESSION: IUD in expected position at the upper uterine segment endometrial
canal.

4.2 cm diameter hemorrhagic cyst of the RIGHT ovary; no follow-up
imaging recommended.

Otherwise negative exam.

## 2021-02-17 NOTE — Telephone Encounter (Signed)
Thanks.  I have reviewed it and sent patient a message.

## 2021-02-19 ENCOUNTER — Other Ambulatory Visit: Payer: Self-pay

## 2021-02-19 ENCOUNTER — Ambulatory Visit: Payer: Medicaid Other | Attending: Obstetrics and Gynecology | Admitting: Physical Therapy

## 2021-02-19 DIAGNOSIS — M6208 Separation of muscle (nontraumatic), other site: Secondary | ICD-10-CM | POA: Insufficient documentation

## 2021-02-19 DIAGNOSIS — M545 Low back pain, unspecified: Secondary | ICD-10-CM | POA: Diagnosis present

## 2021-02-19 DIAGNOSIS — M533 Sacrococcygeal disorders, not elsewhere classified: Secondary | ICD-10-CM | POA: Insufficient documentation

## 2021-02-19 DIAGNOSIS — G8929 Other chronic pain: Secondary | ICD-10-CM | POA: Insufficient documentation
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
## 2021-03-05 ENCOUNTER — Ambulatory Visit: Payer: Medicaid Other | Attending: Obstetrics and Gynecology | Admitting: Physical Therapy

## 2021-03-05 DIAGNOSIS — M533 Sacrococcygeal disorders, not elsewhere classified: Secondary | ICD-10-CM | POA: Insufficient documentation

## 2021-03-05 DIAGNOSIS — M6208 Separation of muscle (nontraumatic), other site: Secondary | ICD-10-CM | POA: Insufficient documentation

## 2021-03-05 DIAGNOSIS — M545 Low back pain, unspecified: Secondary | ICD-10-CM | POA: Insufficient documentation

## 2021-03-05 DIAGNOSIS — G8929 Other chronic pain: Secondary | ICD-10-CM | POA: Insufficient documentation

## 2021-03-19 ENCOUNTER — Other Ambulatory Visit: Payer: Self-pay

## 2021-03-19 ENCOUNTER — Ambulatory Visit: Payer: Medicaid Other | Admitting: Physical Therapy

## 2021-03-19 DIAGNOSIS — G8929 Other chronic pain: Secondary | ICD-10-CM | POA: Diagnosis present

## 2021-03-19 DIAGNOSIS — M533 Sacrococcygeal disorders, not elsewhere classified: Secondary | ICD-10-CM | POA: Diagnosis not present

## 2021-03-19 DIAGNOSIS — M545 Low back pain, unspecified: Secondary | ICD-10-CM | POA: Diagnosis present

## 2021-03-19 DIAGNOSIS — M6208 Separation of muscle (nontraumatic), other site: Secondary | ICD-10-CM | POA: Diagnosis present
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
## 2021-04-02 ENCOUNTER — Ambulatory Visit: Payer: Medicaid Other | Admitting: Physical Therapy

## 2021-04-06 ENCOUNTER — Ambulatory Visit: Payer: Medicaid Other | Admitting: Physical Therapy

## 2021-04-16 ENCOUNTER — Other Ambulatory Visit: Payer: Self-pay

## 2021-04-16 ENCOUNTER — Ambulatory Visit: Payer: Medicaid Other | Attending: Obstetrics and Gynecology | Admitting: Physical Therapy

## 2021-04-16 DIAGNOSIS — G8929 Other chronic pain: Secondary | ICD-10-CM | POA: Diagnosis present

## 2021-04-16 DIAGNOSIS — M533 Sacrococcygeal disorders, not elsewhere classified: Secondary | ICD-10-CM | POA: Insufficient documentation

## 2021-04-16 DIAGNOSIS — M6208 Separation of muscle (nontraumatic), other site: Secondary | ICD-10-CM | POA: Diagnosis not present

## 2021-04-16 DIAGNOSIS — M545 Low back pain, unspecified: Secondary | ICD-10-CM | POA: Insufficient documentation
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
## 2021-04-16 NOTE — Patient Instructions (Addendum)
  Lengthen Back rib by L  shoulder    Lie on R  side , pillow between knees and under head  Pull  arm overhead over mattress, grab the edge of mattress,pull it upward, drawing elbow away from ears  Breathing 10 reps  Open book  Lying on  R_ side , rotating  _L_ only this week  Rotating onto pillow /yoga block  3/4 turn , keep arm lazy and in contact with body to not use upper neck / shoulder muscles  Relax onto pillow behind back.   Pillow/ Block between knees  10 reps   ____  Lift feet  and turn first before turning body at dishwasher, etc.   ___   Keep up with  1) deep core series 1-2    2) Lying on back, knees bent    band under ballmounds  while laying on back w/ knees bent  "W" exercise  10 reps x 2 sets   Band is placed under feet, knees bent, feet are hip width apart Hold band with thumbs point out, keep upper arm and elbow touching the bed the whole time  - inhale and then exhale pull bands by bending elbows hands move in a "w"  (feel shoulder blades squeezing)   3) Multifidis twist  Band is on doorknob: stand further away from door (facing perpendicular)   Twisting trunk without moving the hips and knees Hold band at the level of ribcage, elbows bent,shoulder blades roll back and down like squeezing a pencil under armpit    Exhale twist,.10-15 deg away from door without moving your hips/ knees. Continue to maintain equal weight through legs. Keep knee unlocked.  10 x 2    __ Keep up with pool   Do not over do    __

## 2021-04-17 NOTE — Addendum Note (Signed)
Addended by: Mariane Masters on: 04/17/2021 05:32 PM   Modules accepted: Orders

## 2021-04-26 HISTORY — PX: DIAGNOSTIC LAPAROSCOPY: SUR761

## 2021-04-27 ENCOUNTER — Ambulatory Visit: Payer: Medicaid Other | Attending: Obstetrics and Gynecology | Admitting: Physical Therapy

## 2021-04-27 ENCOUNTER — Other Ambulatory Visit: Payer: Self-pay

## 2021-04-27 DIAGNOSIS — M545 Low back pain, unspecified: Secondary | ICD-10-CM | POA: Diagnosis present

## 2021-04-27 DIAGNOSIS — M6208 Separation of muscle (nontraumatic), other site: Secondary | ICD-10-CM | POA: Diagnosis not present

## 2021-04-27 DIAGNOSIS — G8929 Other chronic pain: Secondary | ICD-10-CM | POA: Insufficient documentation

## 2021-04-27 DIAGNOSIS — M533 Sacrococcygeal disorders, not elsewhere classified: Secondary | ICD-10-CM | POA: Insufficient documentation
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
## 2021-04-27 NOTE — Patient Instructions (Addendum)
Lying on back, knees bent    band under ballmounds  while laying on back w/ knees bent   "W" exercise  10 reps x 2 sets   Band is placed under feet, knees bent, feet are hip width apart Hold band with thumbs point out, keep upper arm and elbow touching the bed the whole time  - inhale and then exhale pull bands by bending elbows hands move in a "w" (feel shoulder blades squeezing)    Lying on back, knees bent    band under ballmounds  while laying on back w/ knees bent  "W" exercise  10 reps x 2 sets   Band is placed under feet, knees bent, feet are hip width apart Hold band with thumbs point out, keep upper arm and elbow touching the bed the whole time  - inhale and then exhale pull bands by bending elbows hands move in a "w"  (feel shoulder blades squeezing)    __________________________  Oblique/ scapula stabilization   Opposite arm   Place band in "U"    band under ballmounds  while laying on back w/ knees bent     20 reps  on each side  Holding band from opposite thigh,  Inhale,    exhale then pull band across body while keeping elbow , shoulders, back of the head pressed down    ______________  Find anterior tilit before each deep core level 1 and 2   Keep up pelvic floor stretches       __ HOLD OFF till next time    Multifidis twist Band is on doorknob: stand further away from door (facing perpendicular)   Twisting trunk without moving the hips and knees Hold band at the level of ribcage, elbows bent,shoulder blades roll back and down like squeezing a pencil under armpit     Exhale twist,.10-15 deg away from door without moving your hips/ knees. Continue to maintain equal weight through legs. Keep knee unlocked. 10 x 2

## 2021-05-25 ENCOUNTER — Other Ambulatory Visit: Payer: Self-pay

## 2021-05-25 ENCOUNTER — Ambulatory Visit: Payer: Medicaid Other | Admitting: Physical Therapy

## 2021-05-25 DIAGNOSIS — G8929 Other chronic pain: Secondary | ICD-10-CM

## 2021-05-25 DIAGNOSIS — M533 Sacrococcygeal disorders, not elsewhere classified: Secondary | ICD-10-CM

## 2021-05-25 DIAGNOSIS — M6208 Separation of muscle (nontraumatic), other site: Secondary | ICD-10-CM
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
## 2021-05-25 NOTE — Patient Instructions (Addendum)
   kitchen counter stretches  Hands on kitchen counter,   Palms shoulder width apart  Minisquat postion Trunk is parallel to floor  A) Pull buttocks back to lengthen spine, knees bent  3 breaths   B) Bring R hand to the L, and stretch the R side trunk  3 breaths   Brings hands to center again Do the same to the L side stretch by placing L hand on top of R   D) Modified thread the needle R hand on L thigh, L  thigh pushing out slightly as the R hands pull in,  elbow bent and pulls to theR,  Look under L armpit   Do the same to other side  ____  Multifidis twist  Band is on doorknob: stand further away from door (facing perpendicular)   Twisting trunk without moving the hips and knees Hold band at the level of ribcage, elbows bent,shoulder blades roll back and down like squeezing a pencil under armpit    Exhale twist,.10-15 deg away from door without moving your hips/ knees. Continue to maintain equal weight through legs. Keep knee unlocked.  10 x 2    __  Lying on back, knees bent    band under ballmounds  while laying on back w/ knees bent  "W" exercise  10 reps x 2 sets   Band is placed under feet, knees bent, feet are hip width apart Hold band with thumbs point out, keep upper arm and elbow touching the bed the whole time  - inhale and then exhale pull bands by bending elbows hands move in a "w"  (feel shoulder blades squeezing)    __________________________  Oblique/ scapula stabilization   Opposite arm   Place band in "U"    band under ballmounds  while laying on back w/ knees bent     20 reps  on each side  Holding band from opposite thigh,  Inhale,    exhale then pull band across body while keeping elbow , shoulders, back of the head pressed down    ______________

## 2021-06-04 ENCOUNTER — Encounter: Payer: Medicaid Other | Admitting: Physical Therapy

## 2021-06-11 ENCOUNTER — Encounter: Payer: Medicaid Other | Admitting: Physical Therapy

## 2021-06-25 ENCOUNTER — Encounter: Payer: Medicaid Other | Admitting: Physical Therapy

## 2021-07-09 ENCOUNTER — Encounter: Payer: Medicaid Other | Admitting: Physical Therapy

## 2021-07-22 ENCOUNTER — Encounter: Payer: Medicaid Other | Admitting: Physical Therapy

## 2021-08-12 ENCOUNTER — Other Ambulatory Visit: Payer: Self-pay

## 2021-08-12 ENCOUNTER — Ambulatory Visit (INDEPENDENT_AMBULATORY_CARE_PROVIDER_SITE_OTHER): Payer: Medicaid Other | Admitting: Obstetrics and Gynecology

## 2021-08-12 ENCOUNTER — Encounter: Payer: Self-pay | Admitting: Obstetrics and Gynecology

## 2021-08-12 VITALS — BP 100/70 | HR 106 | Ht 64.0 in | Wt 153.1 lb

## 2021-08-12 DIAGNOSIS — N809 Endometriosis, unspecified: Secondary | ICD-10-CM

## 2021-08-12 DIAGNOSIS — Z8742 Personal history of other diseases of the female genital tract: Secondary | ICD-10-CM | POA: Diagnosis not present

## 2021-08-12 DIAGNOSIS — R102 Pelvic and perineal pain: Secondary | ICD-10-CM | POA: Diagnosis not present

## 2021-08-12 DIAGNOSIS — M6289 Other specified disorders of muscle: Secondary | ICD-10-CM

## 2021-08-12 NOTE — Patient Instructions (Addendum)
Laparoscopically Assisted Vaginal Hysterectomy A laparoscopically assisted vaginal hysterectomy (LAVH) is a surgical procedure to remove the uterus and cervix. Sometimes, the ovaries and fallopian tubes are also removed. This surgery may be done to treat problems such as: Noncancerous growths in the uterus (uterine fibroids) that cause symptoms. A condition that causes the lining of the uterus to grow in other areas (endometriosis). Problems with pelvic support. Cancer of the cervix, ovaries, uterus, or tissue that lines the uterus (endometrium). Excessive bleeding in the uterus. During an LAVH, some of the surgical removal is done through the vagina, and the rest is done through a few small incisions in the abdomen. This technique may be an option for women who are not able to have a traditional vaginal hysterectomy. After this procedure, you will no longer be able to have a baby, and you will no longer have a menstrual period. Tell a health care provider about: Any allergies you have. All medicines you are taking, including vitamins, herbs, eye drops, creams, and over-the-counter medicines. Any problems you or family members have had with anesthetic medicines. Any blood disorders you have. Any surgeries you have had. Any medical conditions you have. Whether you are pregnant or may be pregnant. What are the risks? Generally, this is a safe procedure. However, problems may occur, including: Bleeding. Infection. Blood clots in the legs or lungs. Having to change from small incisions to open abdominal surgery. Allergic reactions to medicines. Damage to other structures or organs. What happens before the procedure? Staying hydrated Follow instructions from your health care provider about hydration, which may include: Up to 2 hours before the procedure - you may continue to drink clear liquids, such as water, clear fruit juice, black coffee, and plain tea.  Eating and drinking  restrictions Follow instructions from your health care provider about eating and drinking, which may include: 8 hours before the procedure - stop eating heavy meals or foods, such as meat, fried foods, or fatty foods. 6 hours before the procedure - stop eating light meals or foods, such as toast or cereal. 6 hours before the procedure - stop drinking milk or drinks that contain milk. 2 hours before the procedure - stop drinking clear liquids. Medicines Ask your health care provider about: Changing or stopping your regular medicines. This is especially important if you are taking diabetes medicines or blood thinners. Taking medicines such as aspirin and ibuprofen. These medicines can thin your blood. Do not take these medicines unless your health care provider tells you to take them. Taking over-the-counter medicines, vitamins, herbs, and supplements. You may be asked to take a medicine to empty your colon (bowel preparation). General instructions If you were asked to do bowel preparation before the procedure, follow instructions from your health care provider. This procedure can affect the way you feel about yourself. Talk with your health care provider about the physical and emotional changes hysterectomy may cause. Do not use any products that contain nicotine or tobacco for at least 4 weeks before the procedure. These products include cigarettes, chewing tobacco, and vaping devices, such as e-cigarettes. If you need help quitting, ask your health care provider. Plan to have a responsible adult take you home from the hospital or clinic. Plan to have a responsible adult care for you for the time you are told after you leave the hospital or clinic. This is important. Surgery safety Ask your health care provider: How your surgery site will be marked. What steps will be taken to help  prevent infection. These steps may include: Removing hair at the surgery site. Washing skin with a germ-killing  soap. Receiving antibiotic medicine. What happens during the procedure? An IV will be inserted into one of your veins. You may be given: A medicine to help you relax (sedative). A medicine to make you fall asleep (general anesthetic). A medicine to numb the area (local anesthetic). You may have a flexible tube (catheter) put into your bladder to drain urine. Tight-fitting (compression) stockings will be placed on your legs to promote circulation. Three or four small incisions will be made in your abdomen. An incision will also be made in your vagina. Surgical instruments will be inserted into the small incisions. The uterus and cervix, and possibly the ovaries and fallopian tubes, will be removed through your vagina and through the small incisions in the abdomen. The incisions will then be closed with stitches (sutures), skin glue, or adhesive strips. The procedure may vary among health care providers and hospitals. What happens after the procedure? Your blood pressure, heart rate, breathing rate, and blood oxygen level will be monitored until you leave the hospital or clinic. You will be given pain medicine as needed. You may have a liquid diet at first. You will most likely return to your usual diet the day after surgery. You may still have the urinary catheter in place for several hours. It will likely be removed the day after surgery. You may have to wear compression stockings. These stockings help to prevent blood clots and reduce swelling in your legs. You will be encouraged to walk as soon as possible. You will also use a device or do breathing exercises to keep your lungs clear. You will need to wear a sanitary pad for vaginal discharge or bleeding. Summary A laparoscopically assisted vaginal hysterectomy (LAVH) is a surgical procedure to remove the uterus and cervix, and sometimes the ovaries and fallopian tubes. Follow instructions from your health care provider about eating and  drinking before the procedure. During an LAVH, some of the surgical removal is done through the vagina, and the rest is done through a few small incisions in the abdomen. After the procedure you will be given pain medication as needed and will need to wear a sanitary pad. Plan to have a responsible adult take you home from the hospital or clinic. This information is not intended to replace advice given to you by your health care provider. Make sure you discuss any questions you have with your health care provider. Document Revised: 05/15/2020 Document Reviewed: 05/15/2020 Elsevier Patient Education  2022 [employer].

## 2021-08-12 NOTE — Progress Notes (Signed)
GYNECOLOGY PROGRESS NOTE  Subjective:    Patient ID: Veronica Cooke, female    DOB: 01-24-1996, 25 y.o.   MRN: [id number]  HPI  Patient is a 25 y.o. G51P2002 female who presents for discussion of surgery for hysterectomy. She has a history of anxiety chronic pelvic pain, pelvic floor dysfunction, and painful menstrual cycles.  Over the past year, she has been seeing physical therapy for management of her pain, as she had an IUD in place to help management of her cycles (notes her cycles were less heavy with the [facility] IUD but still had pain).  Had to stop physical therapy sessions in August as her referral had ended and insurance would no longer cover. However she has been keeping up with her exercises. Still using occasional Norco Baclofen for her severe pain. Notes that she and her husband still do not desire any more children and knows that she desires a hysterectomy for management. Reports that her pain is debilitating, and is having to take of work, as well as her husband missing work to help care for her children as she can't at times.   Of note, Veronica Cooke was seen at [facility] by a "specialist" in June, and underwent surgery due to a noted cyst on her right ovary and concern for torsion on ultrasound. States that when she did have the surgery, they noted that the cyst was gone.  Did perform biopsy of sidewall which confirmed endometriosis. At that time they also removed her IUD. Currently initiated on OCPs (although patient reported in the past that these did not help with her symptoms either.   The following portions of the patient's history were reviewed and updated as appropriate:   She  has a past medical history of Anemia, Anxiety, Endometriosis, and Headache.  She  has a past surgical history that includes No past surgeries; Wisdom tooth extraction (Bilateral, 2017); Cholecystectomy (N/A, 05/21/2019); and Endometrial ablation.  Her family history includes Breast cancer (age of onset: 74) in  her maternal aunt; Cancer in her maternal grandmother; Cystinosis in her mother; Diabetes in her maternal aunt and maternal grandfather; Gallbladder disease in her mother; Healthy in her father; Stroke in her maternal grandfather.  She  reports that she has never smoked. She has never used smokeless tobacco. She reports that she does not drink alcohol and does not use drugs.   Current Outpatient Medications on File Prior to Visit  Medication Sig Dispense Refill   clonazePAM (KLONOPIN) 0.5 MG tablet Take 0.5 mg by mouth daily.     FLUoxetine HCl (PROZAC PO) Take by mouth. Once daily in morning     gabapentin (NEURONTIN) 600 MG tablet Take 600 mg by mouth at bedtime.     venlafaxine XR (EFFEXOR XR) 75 MG 24 hr capsule Take 75 mg by mouth daily with breakfast.     baclofen (LIORESAL) 10 MG tablet Take 10 mg by mouth at bedtime. (Patient not taking: Reported on 08/12/2021)     Butalbital-APAP-Caffeine 50-300-40 MG CAPS Take 1 tablet by mouth every 8 (eight) hours as needed (tension headaches). (Patient not taking: Reported on 08/12/2021)     citalopram (CELEXA) 40 MG tablet Take 40 mg by mouth daily. (Patient not taking: Reported on 08/12/2021)     HYDROcodone-acetaminophen (NORCO/VICODIN) 5-325 MG tablet Take 1 tablet by mouth every 6 (six) hours as needed. (Patient not taking: Reported on 08/12/2021) 30 tablet 0   hydrOXYzine (ATARAX/VISTARIL) 10 MG tablet Take 10 mg by mouth 3 (three) times  daily as needed for anxiety.  (Patient not taking: Reported on 10/09/2020)     No current facility-administered medications on file prior to visit.   She has No Known Allergies..  Review of Systems Pertinent items noted in HPI and remainder of comprehensive ROS otherwise negative.   Objective:   Blood pressure 100/70, pulse (!) 106, height 5\' 4"  (1.626 m), weight 153 lb 1.6 oz (69.4 kg), last menstrual period 07/30/2021.  Body mass index is 26.28 kg/m. General appearance: alert and no distress Remainder  of exam deferred.    Assessment:   1. Pelvic pain in female   2. Pelvic floor dysfunction   3. History of ovarian cyst   4. Endometriosis      Plan:   1. Pelvic pain in female - Pain now likely explained due to recently diagnosed endometriosis. Also has some pelvic floor dysfunction. Patient desires definitive management with hysterectomy.  I proposed doing a laparoscopic-assisted vaginal hysterectomy (LAVH) and prophylactic bilateral salpingectomy.  No indication for oophorectomy at this time.  Patient agrees with this proposed surgery.  The risks of surgery were discussed in detail with the patient including but not limited to: bleeding which may require transfusion or reoperation; infection which may require antibiotics; injury to bowel, bladder, ureters or other surrounding organs; need for additional procedures including laparotomy or subsequent procedures secondary to abnormal pathology; formation of adhesions; thromboembolic phenomenon; incisional problems and other postoperative/anesthesia complications.  Patient was also advised that she will be discharged home on same day, and expected recovery time after a hysterectomy is 6-8 weeks.  Patient was told that the likelihood that her condition and symptoms will be treated effectively with this surgical management was very high; the postoperative expectations were also discussed in detail. The patient also understands the alternative treatment options which were discussed in full. All questions were answered.  She was told that she will be contacted by our surgical scheduler regarding the time and date of her surgery; routine preoperative instructions will be given to her by the preoperative nursing team.   She is aware of need for preoperative COVID testing and subsequent quarantine from time of test to time of surgery; she will be given further preoperative instructions at that COVID screening visit.   Routine postoperative instructions will be  reviewed with the patient in detail after surgery.  In the meantime, she will continue her OCPs; bleeding precautions were reviewed. Printed patient education handouts about the procedure was given to the patient to review at home.  Tentatively will schedule for January 9th. To return for pre-op in 3 weeks.    2. Pelvic floor dysfunction - Patient still utilizing pelvic floor physical therapy techniques although she is no longer able to attend sessions.  - Notes that she has some level of prolapse, can address at time of surgery if present after hysterectomy.   3. History of ovarian cyst - History of recent surgery for removal of right ovarian cyst (hemorrhagic) in June with no intraoperative findings of a cyst.   4. Endometriosis - Newly diagnosed, on biopsy.  Will plan for hysterectomy for pelvic pain and severe dysmenorrhea. Patient may also still require ovarian hormone suppression after hysterectomy if pain persists.     .A total of 35 minutes were spent on chart review (records, labs, operative note), as well as face-to-face time with the patient during this encounter which involved counseling and coordination of care.   July, MD [facility]

## 2021-08-15 ENCOUNTER — Other Ambulatory Visit: Payer: Self-pay | Admitting: Obstetrics and Gynecology

## 2021-08-15 ENCOUNTER — Encounter: Payer: Self-pay | Admitting: Obstetrics and Gynecology

## 2021-08-15 DIAGNOSIS — Z01818 Encounter for other preprocedural examination: Secondary | ICD-10-CM

## 2021-09-07 ENCOUNTER — Other Ambulatory Visit: Payer: Self-pay

## 2021-09-07 ENCOUNTER — Ambulatory Visit (INDEPENDENT_AMBULATORY_CARE_PROVIDER_SITE_OTHER): Payer: Medicaid Other | Admitting: Obstetrics and Gynecology

## 2021-09-07 ENCOUNTER — Encounter: Payer: Medicaid Other | Admitting: Obstetrics and Gynecology

## 2021-09-07 ENCOUNTER — Encounter: Payer: Self-pay | Admitting: Obstetrics and Gynecology

## 2021-09-07 VITALS — BP 108/41 | HR 90 | Resp 16 | Ht 64.0 in | Wt 148.5 lb

## 2021-09-07 DIAGNOSIS — O26891 Other specified pregnancy related conditions, first trimester: Secondary | ICD-10-CM | POA: Diagnosis not present

## 2021-09-07 DIAGNOSIS — Z3201 Encounter for pregnancy test, result positive: Secondary | ICD-10-CM | POA: Diagnosis not present

## 2021-09-07 DIAGNOSIS — O219 Vomiting of pregnancy, unspecified: Secondary | ICD-10-CM | POA: Diagnosis not present

## 2021-09-07 DIAGNOSIS — N912 Amenorrhea, unspecified: Secondary | ICD-10-CM | POA: Diagnosis not present

## 2021-09-07 DIAGNOSIS — R519 Headache, unspecified: Secondary | ICD-10-CM

## 2021-09-07 LAB — POCT URINE PREGNANCY: Preg Test, Ur: POSITIVE — AB

## 2021-09-07 MED ORDER — METOCLOPRAMIDE HCL 10 MG PO TABS: 10.0000 mg | ORAL_TABLET | Freq: Four times a day (QID) | ORAL | 1 refills | Status: DC | PRN

## 2021-09-07 NOTE — Progress Notes (Signed)
° ° °  GYNECOLOGY PROGRESS NOTE Subjective:    Patient is a 24 y.o. G1P2002 female who presents for pregnancy confirmation. She believes she could be pregnant. Pregnancy is desired. Pregnancy was unplanned. Was actually planning on a hysterectomy next month for endometriosis, pelvic pain. Sexual Activity: multiple partners,  contraception: None. Current symptoms also include positive home pregnancy test, hungry, fatigue, nausea and amenorrhea.  Last period was abnormal. No LMP recorded (lmp unknown). Notes last full cycle was sometime in October, 07/02/2021.  Has spotting in November.    Patient reports that she had intercourse with a different partner other than her husband during the same week of her last ovulation. Is concerned about paternity.   The following portions of the patient's history were reviewed and updated as appropriate: allergies, current medications, past family history, past medical history, past social history, past surgical history, and problem list.  Review of Systems A comprehensive review of systems was negative except for: Gastrointestinal: positive for nausea and vomiting Neurological: positive for headaches     Objective:    BP (!) 108/41    Pulse 90    Resp 16    Ht 5\' 4"  (1.626 m)    Wt 148 lb 8 oz (67.4 kg)    LMP  (LMP Unknown)    BMI 25.49 kg/m  General: alert, no distress, and no acute distress    Lab Review Urine HCG: positive    Assessment:   1. Amenorrhea   2. Positive pregnancy test   3. Nausea and vomiting during pregnancy prior to [redacted] weeks gestation   4. Headache in pregnancy, antepartum, first trimester       Plan:   Pregnancy Test: Positive: EDC: 04/08/2022, with EA 9.4 weeks. Briefly discussed pre-natal care options. Pregnancy, Childbirth and the Newborn book given. Encouraged well-balanced diet, plenty of rest when needed, pre-natal vitamins daily and walking for exercise. Discussed self-help for nausea, avoiding OTC medications until  consulting provider or pharmacist, other than Tylenol as needed, minimal caffeine (1-2 cups daily) and avoiding alcohol. She will schedule her initial OB visit in the next month. Feel free to call with any questions.  - Ultrasound ordered to confirm viability.  - Lengthy discussion had with patient regarding methods for determining paternity and establishing date of conception.  - Will prescribe Reglan for nausea/vomiting, hopefully will also help to manage headaches. Can also utilize Tylenol as needed.    A total of 30 minutes were spent face-to-face with the patient during this encounter and over half of that time involved counseling and coordination of care.   04/10/2022, MD [facility]

## 2021-09-09 ENCOUNTER — Other Ambulatory Visit: Payer: Medicaid Other

## 2021-09-15 ENCOUNTER — Telehealth: Payer: Self-pay | Admitting: Certified Nurse Midwife

## 2021-09-15 NOTE — Telephone Encounter (Signed)
Pt is calling in wanting to see if she could get something called in for her nausea stated that she is experiencing a lot more of it now and it is making her feel really bad.   Pharm:  [facility] on [address] (one across from the [employer]).

## 2021-09-16 ENCOUNTER — Telehealth: Payer: Self-pay | Admitting: Obstetrics and Gynecology

## 2021-09-16 NOTE — Telephone Encounter (Signed)
Pt called following up on message from yesterday- pt is requesting zofran. Please Advise.

## 2021-09-16 NOTE — Telephone Encounter (Signed)
Pt called back and stated that we can ignore the previous message.

## 2021-09-17 ENCOUNTER — Telehealth: Payer: Self-pay | Admitting: *Deleted

## 2021-09-17 ENCOUNTER — Telehealth: Payer: Self-pay | Admitting: Obstetrics and Gynecology

## 2021-09-17 ENCOUNTER — Other Ambulatory Visit: Payer: Self-pay

## 2021-09-17 ENCOUNTER — Emergency Department
Admission: EM | Admit: 2021-09-17 | Discharge: 2021-09-17 | Disposition: A | Discharge status: Home / Self Care | Payer: Medicaid Other | Attending: Emergency Medicine | Admitting: Emergency Medicine

## 2021-09-17 ENCOUNTER — Emergency Department: Payer: Medicaid Other

## 2021-09-17 ENCOUNTER — Ambulatory Visit: Payer: Medicaid Other | Admitting: *Deleted

## 2021-09-17 ENCOUNTER — Encounter: Payer: Self-pay | Admitting: Emergency Medicine

## 2021-09-17 DIAGNOSIS — R112 Nausea with vomiting, unspecified: Secondary | ICD-10-CM

## 2021-09-17 DIAGNOSIS — R1031 Right lower quadrant pain: Secondary | ICD-10-CM | POA: Insufficient documentation

## 2021-09-17 DIAGNOSIS — O210 Mild hyperemesis gravidarum: Secondary | ICD-10-CM | POA: Diagnosis not present

## 2021-09-17 DIAGNOSIS — Z3A01 Less than 8 weeks gestation of pregnancy: Secondary | ICD-10-CM | POA: Diagnosis not present

## 2021-09-17 LAB — COMPREHENSIVE METABOLIC PANEL
ALT: 12 U/L (ref 0–44)
AST: 15 U/L (ref 15–41)
Albumin: 3.7 g/dL (ref 3.5–5.0)
Alkaline Phosphatase: 45 U/L (ref 38–126)
Anion gap: 4 — ABNORMAL LOW (ref 5–15)
BUN: 9 mg/dL (ref 6–20)
CO2: 23 mmol/L (ref 22–32)
Calcium: 8.5 mg/dL — ABNORMAL LOW (ref 8.9–10.3)
Chloride: 107 mmol/L (ref 98–111)
Creatinine, Ser: 0.46 mg/dL (ref 0.44–1.00)
GFR, Estimated: >60 mL/min (ref >60)
Glucose, Bld: 150 mg/dL — ABNORMAL HIGH (ref 70–99)
Potassium: 3.4 mmol/L — ABNORMAL LOW (ref 3.5–5.1)
Sodium: 134 mmol/L — ABNORMAL LOW (ref 135–145)
Total Bilirubin: 0.7 mg/dL (ref 0.3–1.2)
Total Protein: 6.7 g/dL (ref 6.5–8.1)

## 2021-09-17 LAB — URINALYSIS, ROUTINE W REFLEX MICROSCOPIC
Bilirubin Urine: NEGATIVE
Glucose, UA: NEGATIVE mg/dL
Ketones, ur: NEGATIVE mg/dL
Nitrite: NEGATIVE
Protein, ur: NEGATIVE mg/dL
Specific Gravity, Urine: >1.03 — ABNORMAL HIGH (ref 1.005–1.030)
pH: 5.5 (ref 5.0–8.0)

## 2021-09-17 LAB — HCG, QUANTITATIVE, PREGNANCY: hCG, Beta Chain, Quant, S: 76843 m[IU]/mL — ABNORMAL HIGH (ref <5)

## 2021-09-17 LAB — CBC
HCT: 37.3 % (ref 36.0–46.0)
Hemoglobin: 12.4 g/dL (ref 12.0–15.0)
MCH: 28.1 pg (ref 26.0–34.0)
MCHC: 33.2 g/dL (ref 30.0–36.0)
MCV: 84.6 fL (ref 80.0–100.0)
Platelets: 299 10*3/uL (ref 150–400)
RBC: 4.41 MIL/uL (ref 3.87–5.11)
RDW: 11.9 % (ref 11.5–15.5)
WBC: 5.4 10*3/uL (ref 4.0–10.5)
nRBC: 0 % (ref 0.0–0.2)

## 2021-09-17 LAB — URINALYSIS, MICROSCOPIC (REFLEX)

## 2021-09-17 LAB — LIPASE, BLOOD: Lipase: 24 U/L (ref 11–51)

## 2021-09-17 IMAGING — MR MR PELVIS W/O CM
6 of 8 series · 28 of 48 positions shown · non-contrast
Comparison: Same day pelvic ultrasound.

CLINICAL DATA: Sharp right lower quadrant pain.  Ten weeks pregnant

EXAM:
MRI ABDOMEN AND PELVIS WITHOUT CONTRAST
TECHNIQUE: Multiplanar multisequence MR imaging of the abdomen and pelvis was
performed. No intravenous contrast was administered.

[Series 4: cor haste · coronal · 5.0mm · 1.12mm/px · 2 of 30 slices shown]
[im 1/30]
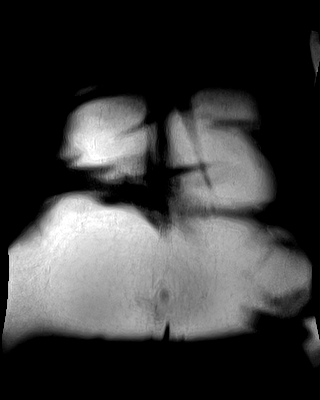
[im 30/30]
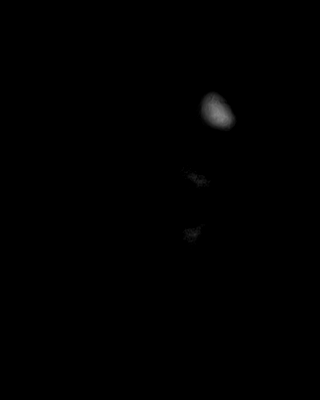

[Series 5: cor haste fs · coronal · 5.0mm · 1.12mm/px · 2 of 30 slices shown]
[im 1/30]
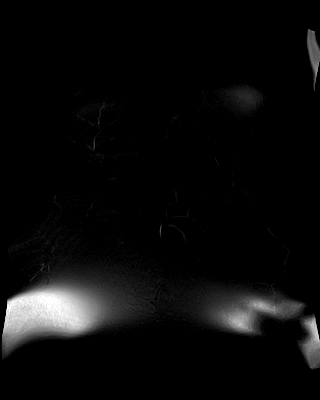
[im 30/30]
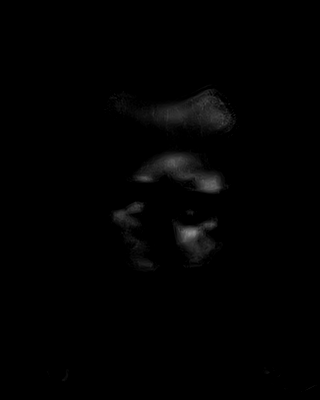

[Series 6: bSSFP · coronal · 5.0mm · 0.78mm/px · 3 of 30 slices shown (1 of 2)]
[im 1/30]
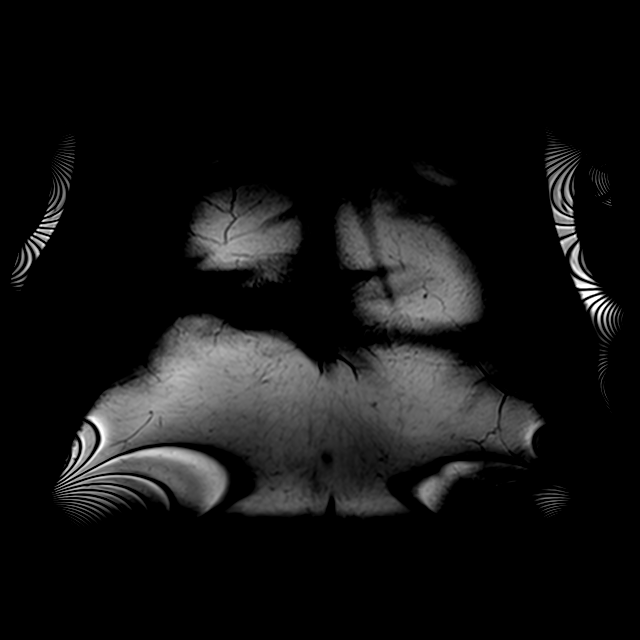
[im 15/30]
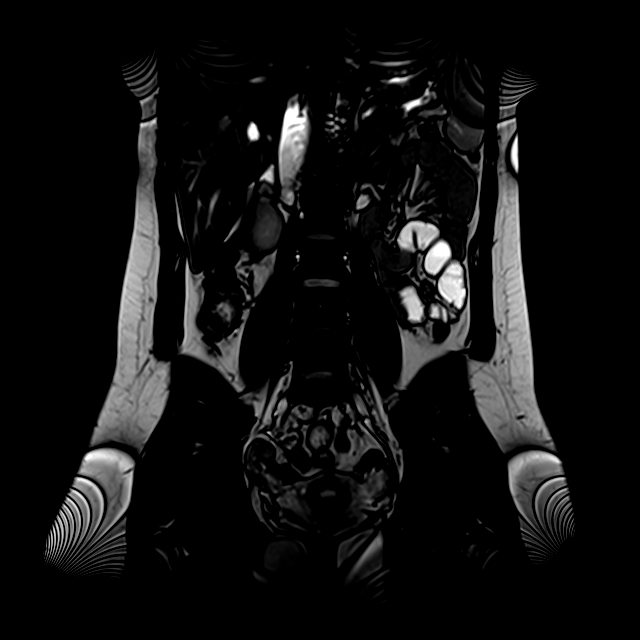
[im 30/30]
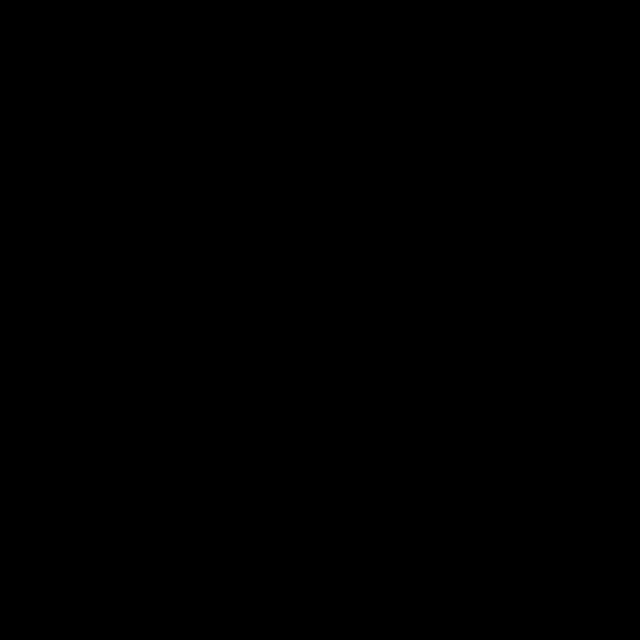

[Series 9: T2 · axial · 4.0mm · 1.19mm/px · z∈[-178,+336]mm · 9 of 108 slices shown (1 of 2)]
[im 1/108]
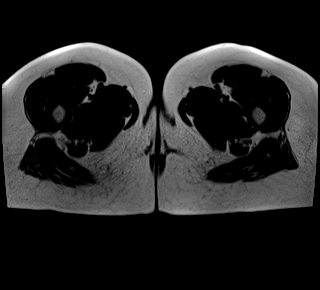
[im 14/108]
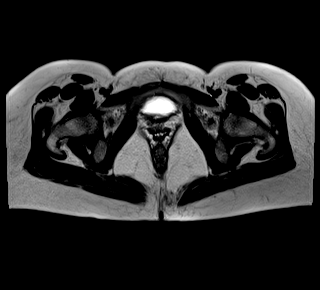
[im 27/108]
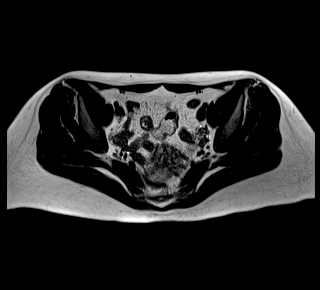
[im 41/108]
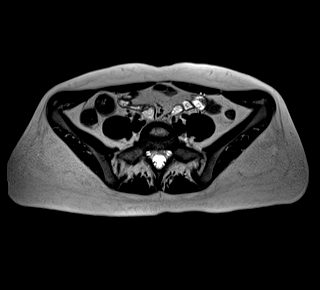
[im 54/108]
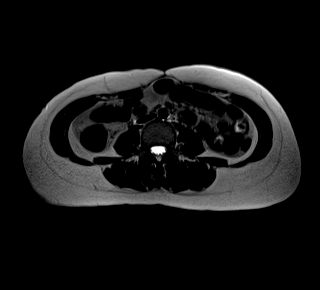
[im 67/108]
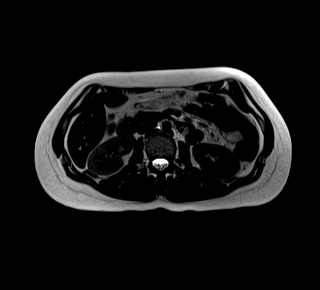
[im 81/108]
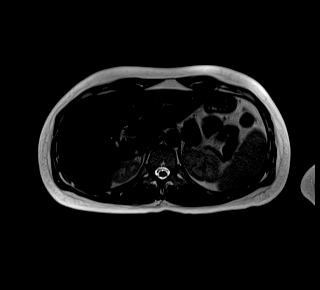
[im 94/108]
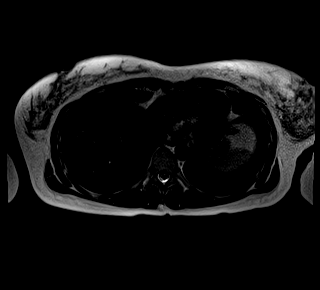
[im 108/108]
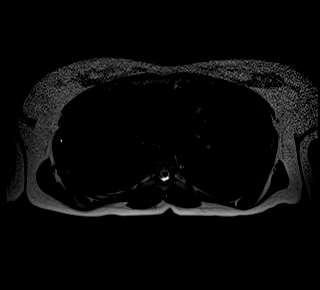

[Series 12: T2 · axial · 4.0mm · 1.19mm/px · z∈[-178,+336]mm · 8 of 108 slices shown (2 of 2)]
[im 1/108]
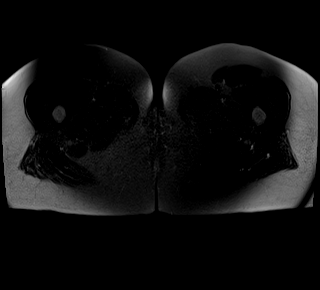
[im 14/108]
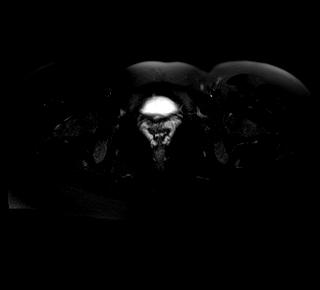
[im 27/108]
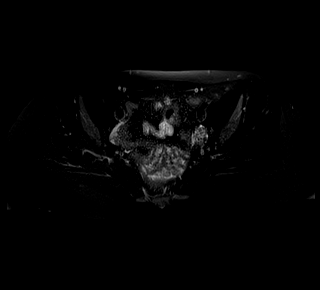
[im 41/108]
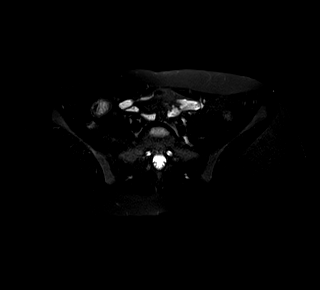
[im 67/108]
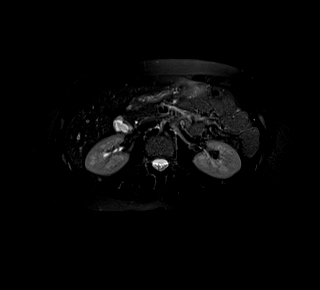
[im 81/108]
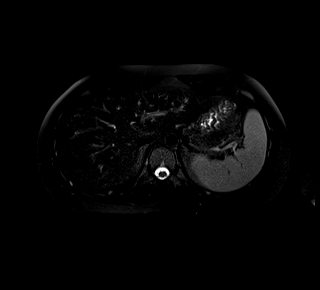
[im 94/108]
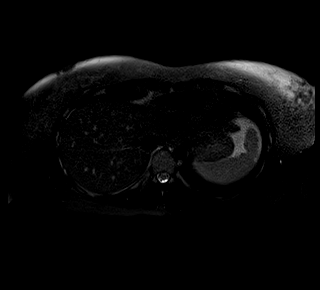
[im 108/108]
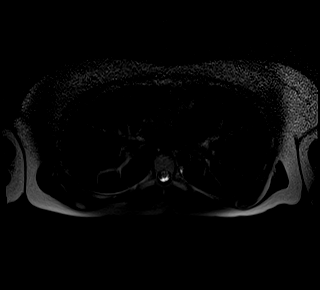

[Series 15: bSSFP · axial · 4.0mm · 0.59mm/px · z∈[-178,+14]mm · 4 of 108 slices shown (2 of 2)]
[im 1/108]
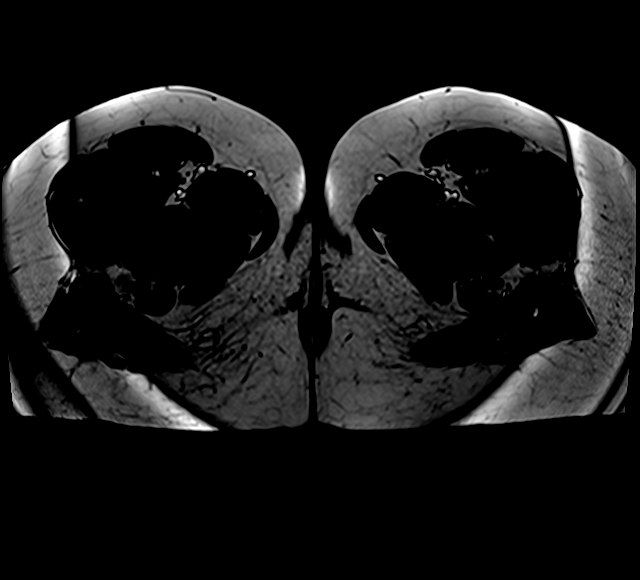
[im 14/108]
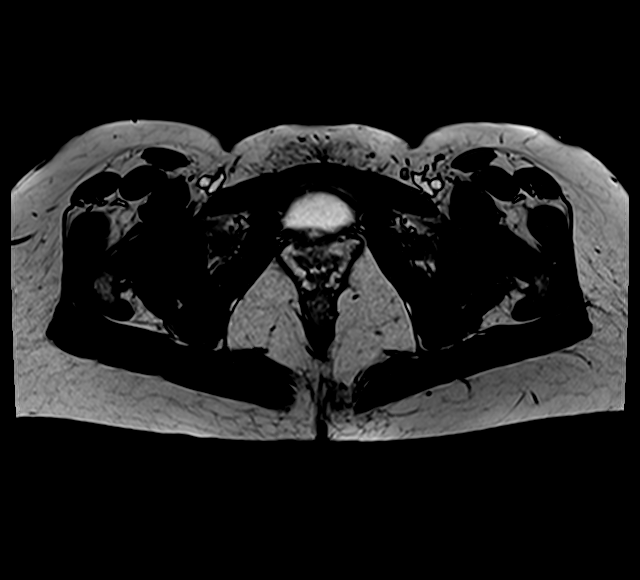
[im 27/108]
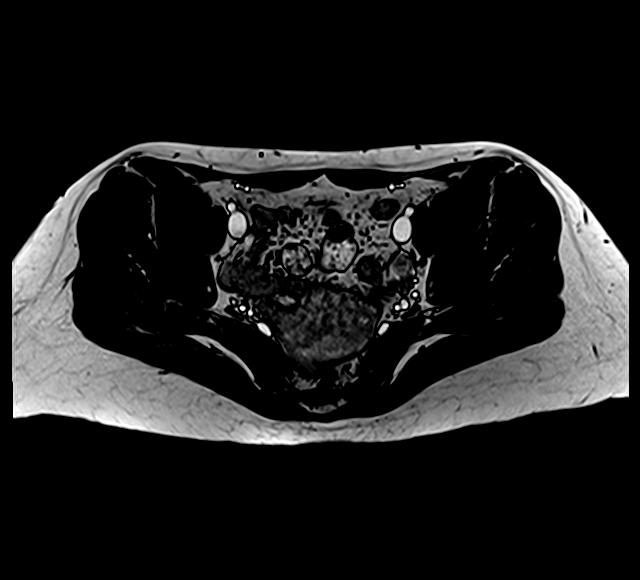
[im 41/108]
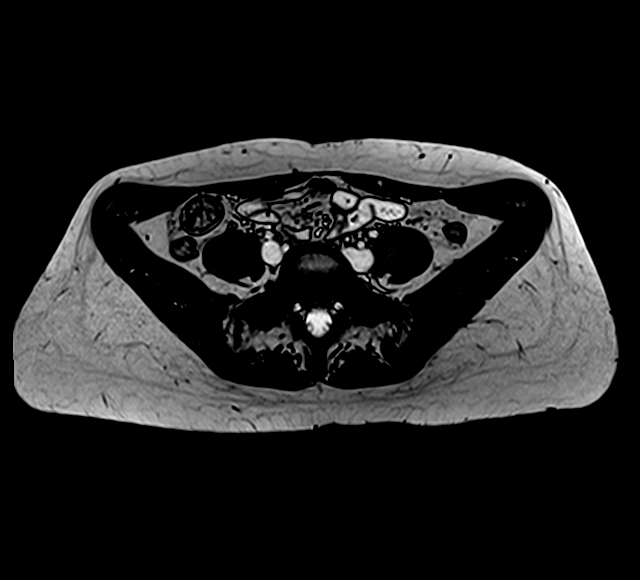

[28 of 48 positions shown; findings below may reference images not displayed]

FINDINGS: COMBINED FINDINGS FOR BOTH MR ABDOMEN AND PELVIS

Lower chest: No acute abnormality.

Hepatobiliary: Unremarkable noncontrast appearance of the hepatic
parenchyma. Gallbladder surgically absent. No biliary ductal
dilation.

Pancreas: Intrinsic T1 signal of the pancreatic parenchyma is within
normal limits. No pancreatic ductal dilation.

Spleen:  Within normal limits in size and appearance.

Adrenals/Urinary Tract: Bilateral adrenal glands are unremarkable.
No hydronephrosis.

Stomach/Bowel: Stomach is unremarkable for degree of distension. No
pathologic dilation of small or large bowel. Normal appendix. No
evidence of acute bowel inflammation.

Vascular/Lymphatic: No pathologically enlarged lymph nodes
identified. No abdominal aortic aneurysm demonstrated.

Reproductive: Gravid uterus, better evaluated on same-day pelvic
ultrasound. Left ovarian 1.8 cm corpus luteum. Right ovary appears
unremarkable.

Other:  Trace, physiologic volume of pelvic fluid.

Musculoskeletal: No suspicious bone lesions identified.
IMPRESSION: 1. No acute findings within the abdomen or pelvis to explain right
lower quadrant pain. Normal appendix.
2. Gravid uterus, better evaluated on same-day pelvic ultrasound.

## 2021-09-17 IMAGING — MR MR ABDOMEN W/O CM
6 of 8 series · 28 of 48 positions shown · non-contrast
Comparison: Same day pelvic ultrasound.

CLINICAL DATA: Sharp right lower quadrant pain.  Ten weeks pregnant

EXAM:
MRI ABDOMEN AND PELVIS WITHOUT CONTRAST
TECHNIQUE: Multiplanar multisequence MR imaging of the abdomen and pelvis was
performed. No intravenous contrast was administered.

[Series 4: cor haste · coronal · 5.0mm · 1.12mm/px · 2 of 30 slices shown]
[im 1/30]
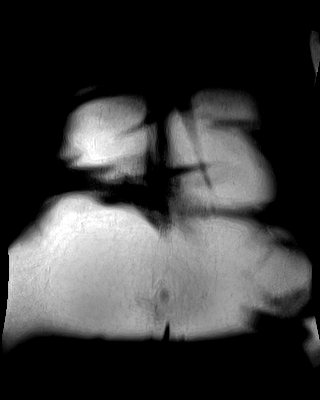
[im 30/30]
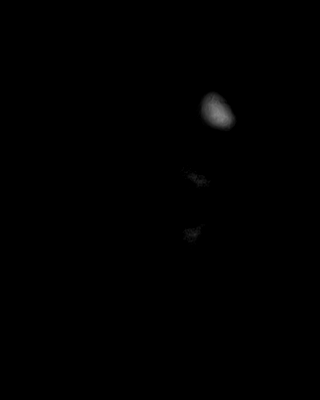

[Series 5: cor haste fs · coronal · 5.0mm · 1.12mm/px · 2 of 30 slices shown]
[im 1/30]
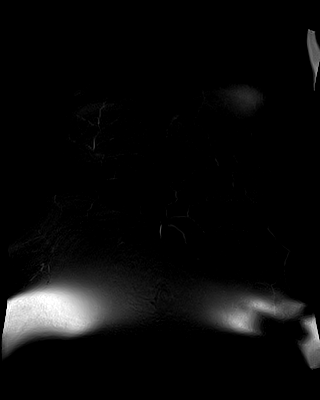
[im 30/30]
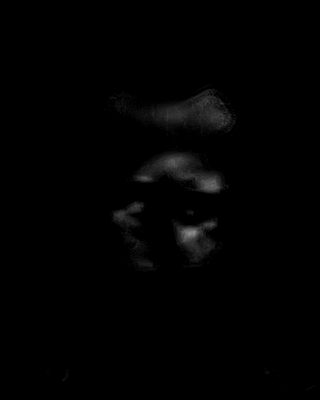

[Series 6: bSSFP · coronal · 5.0mm · 0.78mm/px · 3 of 30 slices shown (1 of 2)]
[im 1/30]
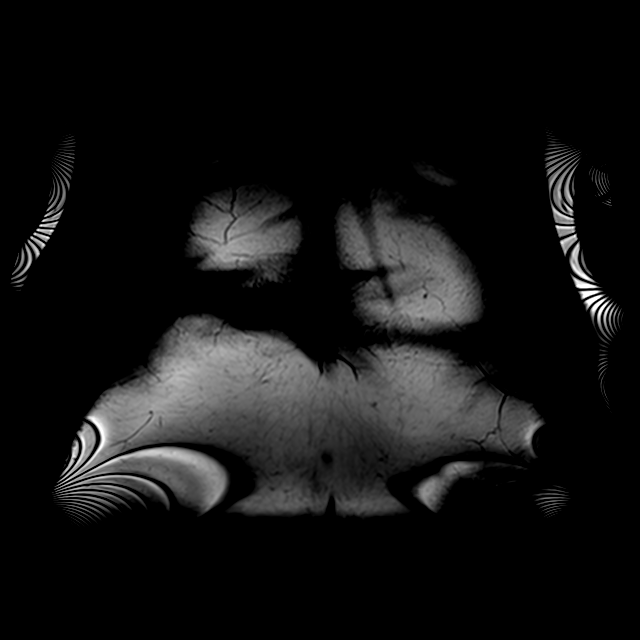
[im 15/30]
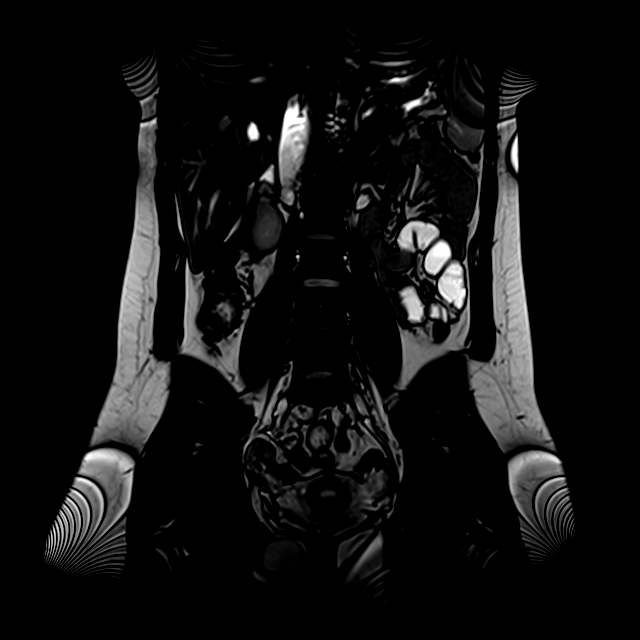
[im 30/30]
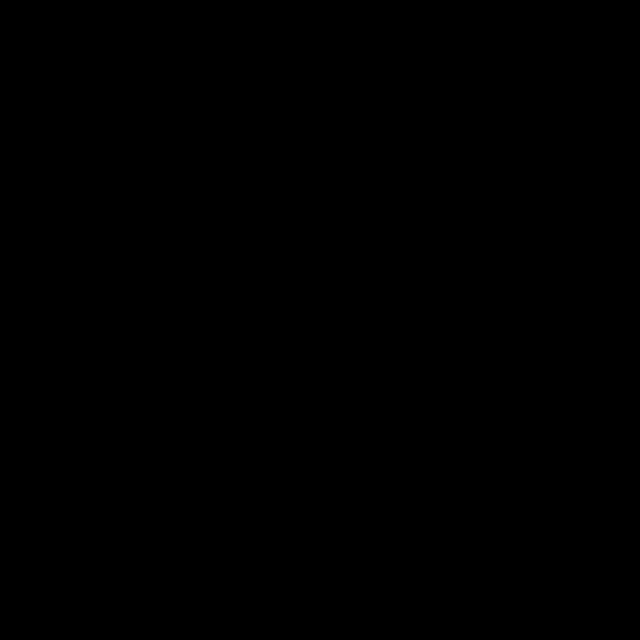

[Series 9: T2 · axial · 4.0mm · 1.19mm/px · z∈[-178,+336]mm · 9 of 108 slices shown (1 of 2)]
[im 1/108]
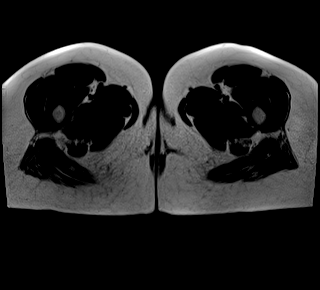
[im 14/108]
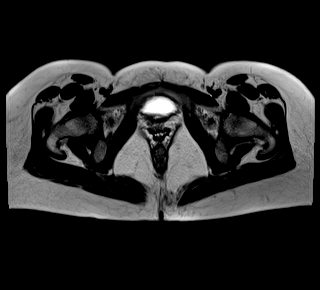
[im 27/108]
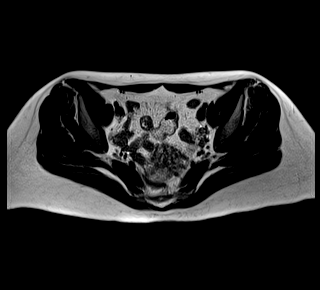
[im 41/108]
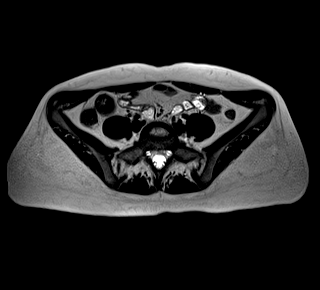
[im 54/108]
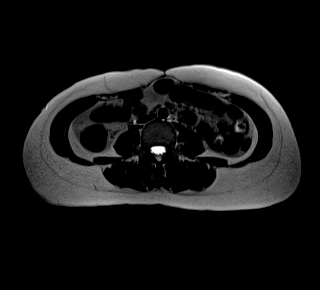
[im 67/108]
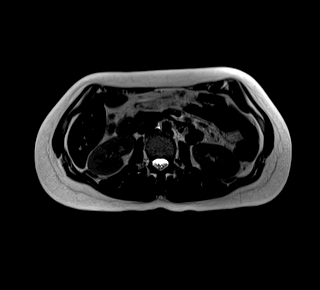
[im 81/108]
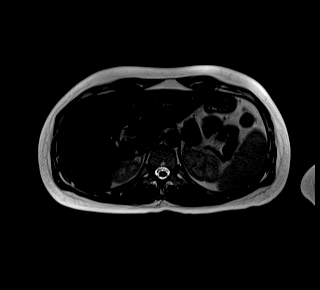
[im 94/108]
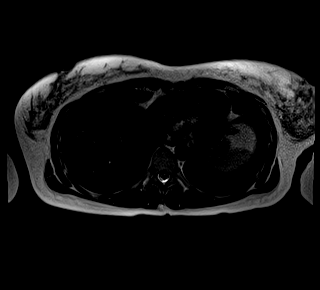
[im 108/108]
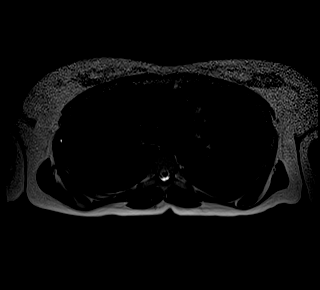

[Series 12: T2 · axial · 4.0mm · 1.19mm/px · z∈[-178,+336]mm · 8 of 108 slices shown (2 of 2)]
[im 1/108]
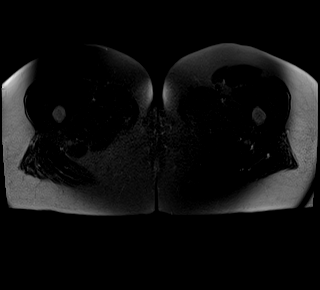
[im 14/108]
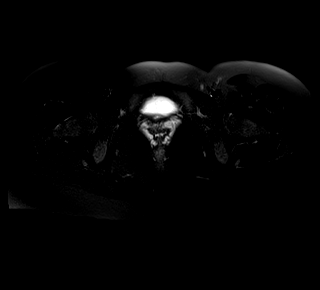
[im 27/108]
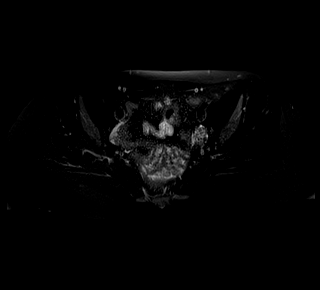
[im 41/108]
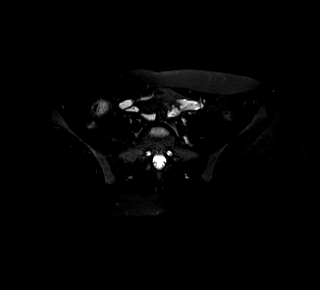
[im 67/108]
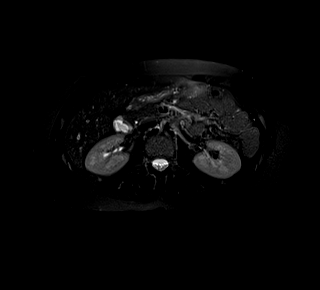
[im 81/108]
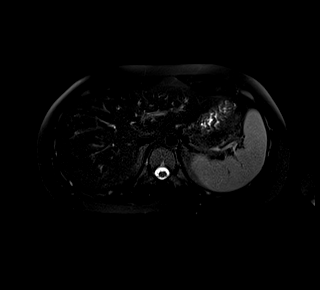
[im 94/108]
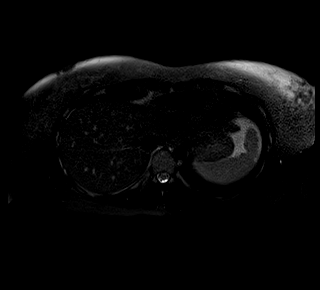
[im 108/108]
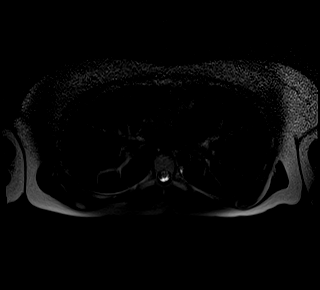

[Series 15: bSSFP · axial · 4.0mm · 0.59mm/px · z∈[-178,+14]mm · 4 of 108 slices shown (2 of 2)]
[im 1/108]
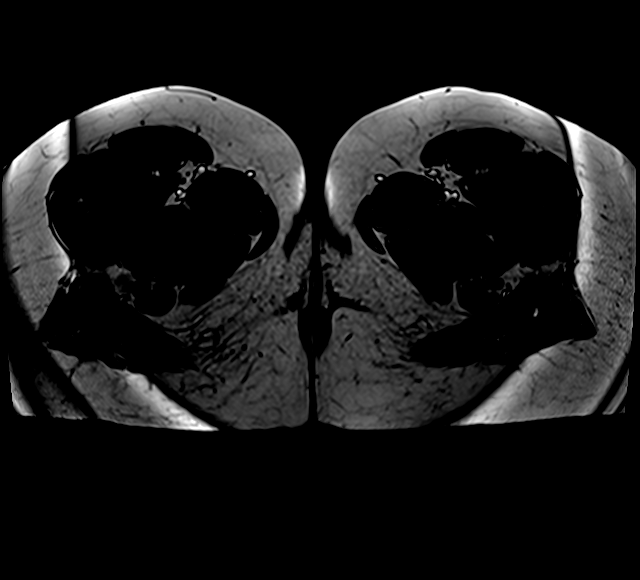
[im 14/108]
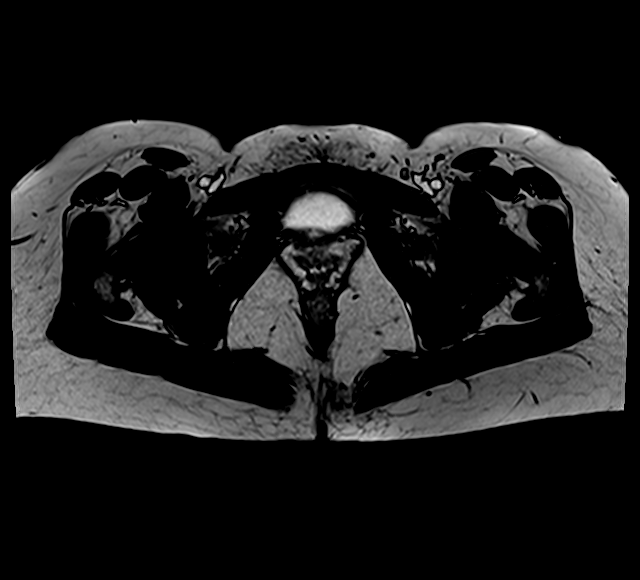
[im 27/108]
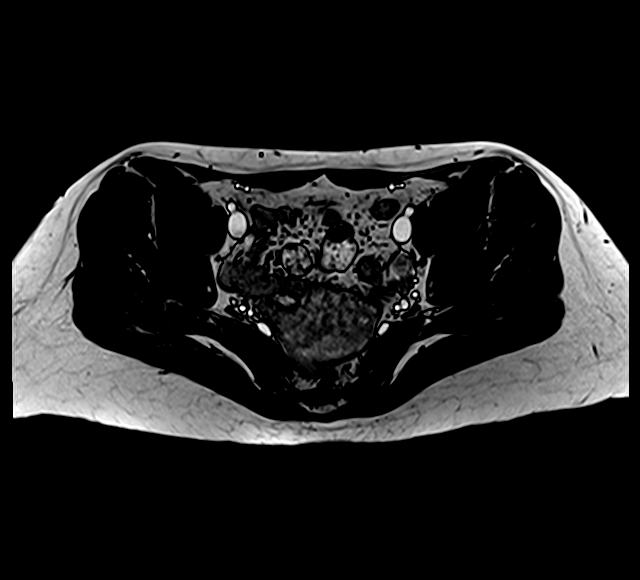
[im 41/108]
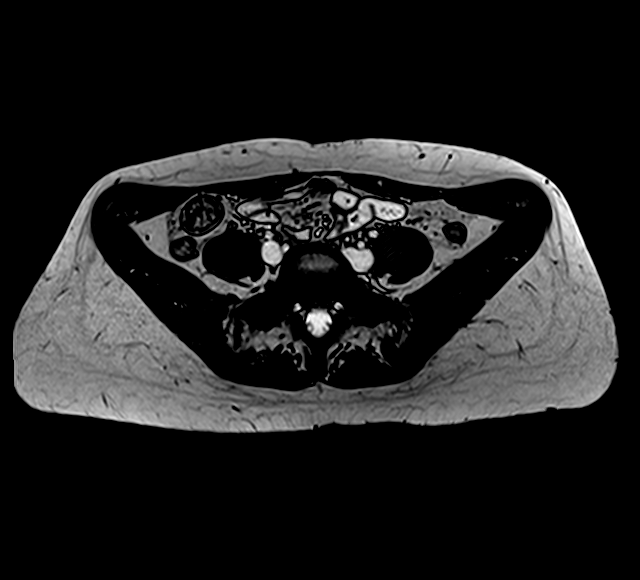

[28 of 48 positions shown; findings below may reference images not displayed]

FINDINGS: COMBINED FINDINGS FOR BOTH MR ABDOMEN AND PELVIS

Lower chest: No acute abnormality.

Hepatobiliary: Unremarkable noncontrast appearance of the hepatic
parenchyma. Gallbladder surgically absent. No biliary ductal
dilation.

Pancreas: Intrinsic T1 signal of the pancreatic parenchyma is within
normal limits. No pancreatic ductal dilation.

Spleen:  Within normal limits in size and appearance.

Adrenals/Urinary Tract: Bilateral adrenal glands are unremarkable.
No hydronephrosis.

Stomach/Bowel: Stomach is unremarkable for degree of distension. No
pathologic dilation of small or large bowel. Normal appendix. No
evidence of acute bowel inflammation.

Vascular/Lymphatic: No pathologically enlarged lymph nodes
identified. No abdominal aortic aneurysm demonstrated.

Reproductive: Gravid uterus, better evaluated on same-day pelvic
ultrasound. Left ovarian 1.8 cm corpus luteum. Right ovary appears
unremarkable.

Other:  Trace, physiologic volume of pelvic fluid.

Musculoskeletal: No suspicious bone lesions identified.
IMPRESSION: 1. No acute findings within the abdomen or pelvis to explain right
lower quadrant pain. Normal appendix.
2. Gravid uterus, better evaluated on same-day pelvic ultrasound.

## 2021-09-17 MED ORDER — ACETAMINOPHEN 10 MG/ML IV SOLN
1000.0000 mg | Freq: Four times a day (QID) | INTRAVENOUS | Status: DC
  Administered 2021-09-17: 17:00:00 1000 mg via INTRAVENOUS
  Filled 2021-09-17 (×3): qty 100

## 2021-09-17 MED ORDER — ONDANSETRON 4 MG PO TBDP: 4.0000 mg | ORAL_TABLET | Freq: Three times a day (TID) | ORAL | 0 refills | Status: DC | PRN

## 2021-09-17 MED ORDER — SODIUM CHLORIDE 0.9 % IV BOLUS
1000.0000 mL | Freq: Once | INTRAVENOUS | Status: AC
  Administered 2021-09-17: 17:00:00 1000 mL via INTRAVENOUS

## 2021-09-17 MED ORDER — ONDANSETRON HCL 4 MG/2ML IJ SOLN
4.0000 mg | Freq: Once | INTRAMUSCULAR | Status: AC
  Administered 2021-09-17: 17:00:00 4 mg via INTRAVENOUS
  Filled 2021-09-17: qty 2

## 2021-09-17 MED ORDER — ACETAMINOPHEN 500 MG PO TABS
1000.0000 mg | ORAL_TABLET | Freq: Once | ORAL | Status: AC
  Administered 2021-09-17: 15:00:00 1000 mg via ORAL
  Filled 2021-09-17: qty 2

## 2021-09-17 MED ORDER — ONDANSETRON HCL 4 MG PO TABS
4.0000 mg | ORAL_TABLET | Freq: Once | ORAL | Status: AC
  Administered 2021-09-17: 15:00:00 4 mg via ORAL
  Filled 2021-09-17: qty 1
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
## 2021-09-17 NOTE — Telephone Encounter (Signed)
Veronica Cooke. Called to make [name] aware that pt was headed to office with pain- pt arrived hold right side- pt states started having pain this morning/ last night. Made pt aware the best place for evaluation would be the ER- as I do not have any openings today and she was only scheduled for a nurse visit that was over the phone. Pt was very understanding just concerned and is headed over to ER.

## 2021-09-17 NOTE — Telephone Encounter (Signed)
I called Veronica Cooke to begin her New OB Intake Telephone appointment. She informed me she is on her way to the office. I explained her visit is by telephone. She informed me she was having pain and she called the office and they told her to come on in to the office. I informed her I will contact someone from the office about her intake. I called Mayra Neer, RN, Director and informed her of situation. She will discuss with [facility] and if intake to be done later today will notify me.states it may be reschedule depending on assessment .  Keanthony Poole,RN

## 2021-09-17 NOTE — ED Notes (Signed)
Called lab. They will add on Hcg and run urine preg on urine that was already sent,.

## 2021-09-17 NOTE — ED Triage Notes (Signed)
Pt comes into the ED via POV c/o RLQ abd pain that Is sharp.  Pt thinks she is roughly about [redacted] weeks pregnant.  Pt denies any vaginal bleeding.  Pt does admit to N/V/D.  Pt has even and unlabored respirations at this time.

## 2021-09-17 NOTE — Telephone Encounter (Signed)
Yes, If the ER needs me regarding her, they will definitely contact me.

## 2021-09-17 NOTE — Discharge Instructions (Addendum)
Your work-up was reassuring.  You can take Tylenol 1 g every 8 hours to help with pain.  You are going to take some Zofran to help with nausea.  Return to the ER if develop worsening symptoms, fevers.  Otherwise call your OB for follow-up  IMPRESSION: Single live intrauterine gestation with approximate gestational age of [redacted] weeks and 0 days. EDC based on today's sonogram is 04/26/2022.

## 2021-09-17 NOTE — ED Notes (Signed)
Pt to ED for RLQ abdominal pain that is intermittent and sharp and worsened over last 3 days.  Also endorses nausea, vomiting, diarrhea, dizziness for past 1 week. Denies vaginal bleeding.

## 2021-09-21 ENCOUNTER — Other Ambulatory Visit: Payer: Self-pay | Admitting: Obstetrics and Gynecology

## 2021-09-21 ENCOUNTER — Ambulatory Visit: Payer: Medicaid Other

## 2021-09-21 MED ORDER — PROMETHAZINE HCL 25 MG PO TABS: 25.0000 mg | ORAL_TABLET | Freq: Four times a day (QID) | ORAL | 2 refills | Status: DC | PRN

## 2021-09-21 NOTE — Telephone Encounter (Signed)
I called patient she states she continues to have nausea. She has tried B-6 and unisom, seabands, and Zofran. Would you like for me to call in Phenergan for her. She also wants to know if she can have another ultrasound done, because she never got a change to hear baby's heartbeat. According to patient U/S was cancel cause she had one over the weekend. Please advise.

## 2021-09-21 NOTE — Telephone Encounter (Signed)
The ultrasound is performed to assess viability.  We usually don't repeat just for fetal heart tones as by now her gestational age should be where we can hear with dopplers.  I will send in the Phenergan for her nausea but she needs to be aware that it can make her drowsy.

## 2021-09-22 NOTE — Telephone Encounter (Signed)
Called patient to inform her about u/s and phenergan. She reports that she is dizzy and she feels very faint. I reviewed dizziness protocol with patient and she verbalized understanding, but wants more advise. Please advise.

## 2021-09-23 NOTE — Telephone Encounter (Signed)
She likely needs to try hydration with pedialyte, gatorade/powerade, popisicles if she is having nausea and vomiting as she may be dizzy from dehydration. I know that she was seen in the ER and given fluids recently so needs to keep up the hydration, take it easy when changing positions. If she can tolerate something salty, try crackers or pretzels to help increase her blood pressure.

## 2021-09-26 NOTE — L&D Delivery Note (Signed)
Delivery Note Labor onset: 05/02/22  Labor Onset Time: 1815 Complete dilation at 8:16 PM  Onset of pushing at 2033 FHR second stage Cat 1 Analgesia/Anesthesia intrapartum: Epidural  Guided pushing with maternal urge. Delivery of a viable female at 2044. Fetal head delivered in LOA position.  Nuchal cord: x1 delivered through somersault manuever.  Infant placed in the arms of the adoptive mother at the perineum. Infant then dried and tactile stim produced a lusty cry. Cord double clamped and cut by patient's spouse after 3 min and infant placed skin-to-skin with adoptive mother. These measures were in accordance to the request of all parties.   RN x1 present for birth.  Cord blood sample collected: yes Arterial cord blood sample collected: No  Placenta delivered Schultz, intact, with 3 VC.  Placenta to L&D. Uterine tone firm with massage, then slightly boggy, bleeding small with small clots Prophylactic TXA and PO methergine series for multiparous uterus  No laceration identified.  QBL/EBL (mL): 199 Complications: none APGAR: APGAR (1 MIN): 8   APGAR (5 MINS): 9   APGAR (10 MINS):   Mom to postpartum.  Baby to  Skin-to-skin with adoptive mother and will go to couplet care .  Circumcision Yes  Roma Schanz DNP, CNM 05/02/2022, 8:59 PM

## 2021-09-27 ENCOUNTER — Other Ambulatory Visit: Payer: Medicaid Other

## 2021-09-27 ENCOUNTER — Emergency Department
Admission: EM | Admit: 2021-09-27 | Discharge: 2021-09-27 | Disposition: A | Discharge status: Home / Self Care | Payer: Medicaid Other | Attending: Emergency Medicine | Admitting: Emergency Medicine

## 2021-09-27 ENCOUNTER — Other Ambulatory Visit: Payer: Self-pay

## 2021-09-27 DIAGNOSIS — N9489 Other specified conditions associated with female genital organs and menstrual cycle: Secondary | ICD-10-CM | POA: Insufficient documentation

## 2021-09-27 DIAGNOSIS — N3000 Acute cystitis without hematuria: Secondary | ICD-10-CM | POA: Diagnosis not present

## 2021-09-27 DIAGNOSIS — O210 Mild hyperemesis gravidarum: Secondary | ICD-10-CM | POA: Diagnosis present

## 2021-09-27 DIAGNOSIS — Z3A09 9 weeks gestation of pregnancy: Secondary | ICD-10-CM | POA: Diagnosis not present

## 2021-09-27 DIAGNOSIS — O2311 Infections of bladder in pregnancy, first trimester: Secondary | ICD-10-CM | POA: Insufficient documentation

## 2021-09-27 LAB — CBC
HCT: 41.5 % (ref 36.0–46.0)
Hemoglobin: 14 g/dL (ref 12.0–15.0)
MCH: 28.3 pg (ref 26.0–34.0)
MCHC: 33.7 g/dL (ref 30.0–36.0)
MCV: 83.8 fL (ref 80.0–100.0)
Platelets: 398 10*3/uL (ref 150–400)
RBC: 4.95 MIL/uL (ref 3.87–5.11)
RDW: 11.9 % (ref 11.5–15.5)
WBC: 9.1 10*3/uL (ref 4.0–10.5)
nRBC: 0 % (ref 0.0–0.2)

## 2021-09-27 LAB — URINALYSIS, ROUTINE W REFLEX MICROSCOPIC
Bacteria, UA: NONE SEEN
Bilirubin Urine: NEGATIVE
Glucose, UA: 50 mg/dL — AB
Ketones, ur: 80 mg/dL — AB
Nitrite: NEGATIVE
Protein, ur: 100 mg/dL — AB
Specific Gravity, Urine: 1.032 — ABNORMAL HIGH (ref 1.005–1.030)
WBC, UA: >50 WBC/hpf — ABNORMAL HIGH (ref 0–5)
pH: 5 (ref 5.0–8.0)

## 2021-09-27 LAB — COMPREHENSIVE METABOLIC PANEL
ALT: 20 U/L (ref 0–44)
AST: 16 U/L (ref 15–41)
Albumin: 4.3 g/dL (ref 3.5–5.0)
Alkaline Phosphatase: 64 U/L (ref 38–126)
Anion gap: 9 (ref 5–15)
BUN: 13 mg/dL (ref 6–20)
CO2: 25 mmol/L (ref 22–32)
Calcium: 9.2 mg/dL (ref 8.9–10.3)
Chloride: 100 mmol/L (ref 98–111)
Creatinine, Ser: 0.48 mg/dL (ref 0.44–1.00)
GFR, Estimated: >60 mL/min (ref >60)
Glucose, Bld: 145 mg/dL — ABNORMAL HIGH (ref 70–99)
Potassium: 3.7 mmol/L (ref 3.5–5.1)
Sodium: 134 mmol/L — ABNORMAL LOW (ref 135–145)
Total Bilirubin: 0.8 mg/dL (ref 0.3–1.2)
Total Protein: 7.8 g/dL (ref 6.5–8.1)

## 2021-09-27 LAB — POC URINE PREG, ED: Preg Test, Ur: POSITIVE — AB

## 2021-09-27 LAB — LIPASE, BLOOD: Lipase: 23 U/L (ref 11–51)

## 2021-09-27 LAB — HCG, QUANTITATIVE, PREGNANCY: hCG, Beta Chain, Quant, S: 215888 m[IU]/mL — ABNORMAL HIGH (ref <5)

## 2021-09-27 MED ORDER — LACTATED RINGERS IV BOLUS
1000.0000 mL | Freq: Once | INTRAVENOUS | Status: AC
  Administered 2021-09-27: 1000 mL via INTRAVENOUS

## 2021-09-27 MED ORDER — DIPHENHYDRAMINE HCL 50 MG/ML IJ SOLN
25.0000 mg | Freq: Once | INTRAMUSCULAR | Status: AC
Start: 2021-09-27 — End: 2021-09-27
  Administered 2021-09-27: 25 mg via INTRAVENOUS
  Filled 2021-09-27: qty 1

## 2021-09-27 MED ORDER — SODIUM CHLORIDE 0.9 % IV SOLN
2.0000 g | Freq: Once | INTRAVENOUS | Status: AC
  Administered 2021-09-27: 2 g via INTRAVENOUS
  Filled 2021-09-27: qty 20

## 2021-09-27 MED ORDER — PROCHLORPERAZINE MALEATE 10 MG PO TABS: 10.0000 mg | ORAL_TABLET | Freq: Three times a day (TID) | ORAL | 0 refills | Status: DC | PRN

## 2021-09-27 MED ORDER — PROCHLORPERAZINE EDISYLATE 10 MG/2ML IJ SOLN
10.0000 mg | Freq: Once | INTRAMUSCULAR | Status: AC
Start: 2021-09-27 — End: 2021-09-27
  Administered 2021-09-27: 10 mg via INTRAVENOUS
  Filled 2021-09-27: qty 2

## 2021-09-27 MED ORDER — DOXYLAMINE-PYRIDOXINE 10-10 MG PO TBEC: DELAYED_RELEASE_TABLET | ORAL | 0 refills | Status: DC

## 2021-09-27 MED ORDER — CEPHALEXIN 500 MG PO CAPS: 500.0000 mg | ORAL_CAPSULE | Freq: Two times a day (BID) | ORAL | 0 refills | Status: AC

## 2021-09-27 MED ORDER — SODIUM CHLORIDE 0.9 % IV SOLN
1.0000 mg | Freq: Once | INTRAVENOUS | Status: AC
  Administered 2021-09-27: 1 mg via INTRAVENOUS
  Filled 2021-09-27: qty 0.2

## 2021-09-27 MED ORDER — FAMOTIDINE IN NACL 20-0.9 MG/50ML-% IV SOLN
20.0000 mg | Freq: Once | INTRAVENOUS | Status: AC
Start: 2021-09-27 — End: 2021-09-27
  Administered 2021-09-27: 20 mg via INTRAVENOUS
  Filled 2021-09-27: qty 50

## 2021-09-27 NOTE — Discharge Instructions (Signed)
Try taking the Diclegis regularly for the next few days  Take the Compazine for severe nausea  Follow-up with your OBGYN in 1 week  Take prenatal vitamins

## 2021-09-27 NOTE — ED Notes (Signed)
Has eaten crackers and drank and is keeping down.

## 2021-09-27 NOTE — ED Triage Notes (Signed)
Pt states she is [redacted] weeks pregnant and having hyperemesis over the past 3 days with >20 episodes of vomiting, states today having some bright red blood with emesis.

## 2021-09-27 NOTE — ED Provider Notes (Signed)
[facility] Provider Note    Event Date/Time   First MD Initiated Contact with Patient 09/27/21 1122     (approximate)   History   Emesis During Pregnancy   HPI  Veronica Cooke is a 26 y.o. female G2, P1 at estimated [redacted] weeks gestational age here with nausea and vomiting.  The patient states that over the last 3 days, she has had acute on chronic worsening of her nausea.  She has a history of hyperemesis and was previously seen here for this.  She reports that she has vomited over 20 times in the last 2 days, and has had occasional blood-tinged emesis.  No overt hematemesis.  No blood in her stools.  She reports generalized weakness, lightheadedness, and fatigue related to this.  She says she has not been able to keep anything solid down in the last 2 days.  She is lightheaded when she walks.  Denies any abdominal pain.  No vaginal bleeding or discharge.  No urinary symptoms other than darker than usual urine.     Physical Exam   Triage Vital Signs: ED Triage Vitals  Enc Vitals Group     BP 09/27/21 1050 116/79     Pulse Rate 09/27/21 1050 95     Resp 09/27/21 1050 18     Temp 09/27/21 1050 98.3 F (36.8 C)     Temp Source 09/27/21 1050 Oral     SpO2 09/27/21 1050 99 %     Weight 09/27/21 1051 145 lb (65.8 kg)     Height 09/27/21 1051 5\' 4"  (1.626 m)     Head Circumference --      Peak Flow --      Pain Score --      Pain Loc --      Pain Edu? --      Excl. in [place]? --     Most recent vital signs: Vitals:   09/27/21 1050 09/27/21 1319  BP: 116/79 111/68  Pulse: 95 74  Resp: 18 14  Temp: 98.3 F (36.8 C) 98.9 F (37.2 C)  SpO2: 99% 98%     General: Awake, no distress.  CV:  Good peripheral perfusion. Not tachycardic. No murmurs. Resp:  Normal effort. Normal WOB. Abd:  No distention. Non-tender. No guarding or rebound. No peritonitis. Other:  Dry mucous membranes, but overall normal skin turgor and cap refill.   ED Results / Procedures  / Treatments   Labs (all labs ordered are listed, but only abnormal results are displayed) Labs Reviewed  COMPREHENSIVE METABOLIC PANEL - Abnormal; Notable for the following components:      Result Value   Sodium 134 (*)    Glucose, Bld 145 (*)    All other components within normal limits  URINALYSIS, ROUTINE W REFLEX MICROSCOPIC - Abnormal; Notable for the following components:   Color, Urine AMBER (*)    APPearance CLOUDY (*)    Specific Gravity, Urine 1.032 (*)    Glucose, UA 50 (*)    Hgb urine dipstick SMALL (*)    Ketones, ur 80 (*)    Protein, ur 100 (*)    Leukocytes,Ua LARGE (*)    WBC, UA >50 (*)    All other components within normal limits  HCG, QUANTITATIVE, PREGNANCY - Abnormal; Notable for the following components:   hCG, Beta Chain, Quant, S 215,888 (*)    All other components within normal limits  POC URINE PREG, ED - Abnormal; Notable for the following  components:   Preg Test, Ur POSITIVE (*)    All other components within normal limits  URINE CULTURE  LIPASE, BLOOD  CBC     EKG  Sinus tachycardia, VR 101. PR 126, QRS 76, QTc 425. No acute St elevations or depressions. No ischemia or infarct.   RADIOLOGY     PROCEDURES:  Critical Care performed: No  Procedures   MEDICATIONS ORDERED IN ED: Medications  lactated ringers bolus 1,000 mL (0 mLs Intravenous Stopped 09/27/21 1322)  prochlorperazine (COMPAZINE) injection 10 mg (10 mg Intravenous Given 09/27/21 1155)  diphenhydrAMINE (BENADRYL) injection 25 mg (25 mg Intravenous Given [id number])  folic acid 1 mg in sodium chloride 0.9 % 50 mL IVPB (0 mg Intravenous Stopped 09/27/21 1339)  lactated ringers bolus 1,000 mL (0 mLs Intravenous Stopped 09/27/21 1424)  famotidine (PEPCID) IVPB 20 mg premix (0 mg Intravenous Stopped 09/27/21 1234)  cefTRIAXone (ROCEPHIN) 2 g in sodium chloride 0.9 % 100 mL IVPB (0 g Intravenous Stopped 09/27/21 1321)     IMPRESSION / MDM / ASSESSMENT AND PLAN / ED COURSE  I  reviewed the triage vital signs and the nursing notes.                              Differential diagnosis includes, but is not limited to, n/v of pregnancy, UTI, less likely cholecystitis, gastritis, GERD, PUD, viral illness, food-borne illness.  26 yo G2P1 at estimated [redacted] wk GA with h/o hyperemesis here with suspected hyperemesis gravidarum. VSS, though pt appears mildly dehydrated on arrival. No tachycardia. Lab work reviewed. CBC shows no significant leukocytosis or anemia. CMP with normal renal function. Lipase normal. UA does show pyuria, ketonuria c/w dehydration. She denies any urinary sx, flank pain, or signs to suggest pyelo but given first trimester pregnancy, will tx for possible concomitant UTI. Suspect n/v of pregnancy. H/o same based on my review of her prior OB notes. She feels much better after IVF, IV zofran and IV compazine, which seemed to work well. She would like to try this as an outpt as Zofran ODT was not working, will start this and have her regularly take diclegis. Keflex given for UTI.      FINAL CLINICAL IMPRESSION(S) / ED DIAGNOSES   Final diagnoses:  Hyperemesis gravidarum  Acute cystitis without hematuria     Rx / DC Orders   ED Discharge Orders          Ordered    prochlorperazine (COMPAZINE) 10 MG tablet  Every 8 hours PRN        09/27/21 1452    cephALEXin (KEFLEX) 500 MG capsule  2 times daily        09/27/21 1452    Doxylamine-Pyridoxine (DICLEGIS) 10-10 MG TBEC        09/27/21 1452             Note:  This document was prepared using Dragon voice recognition software and may include unintentional dictation errors.   Duffy Bruce, MD 09/27/21 1524

## 2021-09-27 NOTE — ED Notes (Signed)
See triage note  presents with some n/v  states she has not been able to keep anything down for 3 days  denies any vaginal bleeding   pt is approx 9 weeks

## 2021-09-27 NOTE — ED Notes (Signed)
Pt given ginger ale - states that she feels better.

## 2021-09-29 LAB — URINE CULTURE

## 2021-09-29 NOTE — Progress Notes (Signed)
Chart opened in error

## 2021-10-04 ENCOUNTER — Other Ambulatory Visit: Payer: Self-pay

## 2021-10-04 ENCOUNTER — Ambulatory Visit (INDEPENDENT_AMBULATORY_CARE_PROVIDER_SITE_OTHER): Payer: BC Managed Care – PPO | Admitting: Obstetrics and Gynecology

## 2021-10-04 ENCOUNTER — Ambulatory Visit: Admit: 2021-10-04 | Payer: Medicaid Other | Admitting: Obstetrics and Gynecology

## 2021-10-04 VITALS — BP 108/70 | HR 110 | Resp 16 | Ht 64.0 in | Wt 150.0 lb

## 2021-10-04 DIAGNOSIS — Z3A09 9 weeks gestation of pregnancy: Secondary | ICD-10-CM | POA: Diagnosis not present

## 2021-10-04 DIAGNOSIS — Z719 Counseling, unspecified: Secondary | ICD-10-CM | POA: Diagnosis not present

## 2021-10-04 DIAGNOSIS — Z3481 Encounter for supervision of other normal pregnancy, first trimester: Secondary | ICD-10-CM

## 2021-10-04 SURGERY — HYSTERECTOMY, VAGINAL, LAPAROSCOPY-ASSISTED, WITH SALPINGECTOMY
Anesthesia: General

## 2021-10-04 NOTE — Telephone Encounter (Signed)
She has a NOB intake today will discuss at that time.

## 2021-10-04 NOTE — Progress Notes (Signed)
Aloha Gell presents for NOB nurse interview visit. Pregnancy confirmation done 09/07/2021.  [id number]. Pregnancy education material explained and given. No cats in the home. NOB labs ordered. HIV labs and Drug screen were explained optional and she did not decline. Drug screen ordered. PNV encouraged. Genetic screening options discussed. Genetic testing: Ordered/Declined/Unsure.  Pt may discuss with provider.  Financial policy reviewed. FMLA from reviewed and signed. Pt. To follow up with Dr. Valentino Saxon  in 1 day for NOB physical.  All questions answered.

## 2021-10-05 ENCOUNTER — Encounter: Payer: Self-pay | Admitting: Obstetrics and Gynecology

## 2021-10-05 ENCOUNTER — Other Ambulatory Visit (HOSPITAL_COMMUNITY)
Admission: RE | Admit: 2021-10-05 | Discharge: 2021-10-05 | Disposition: A | Discharge status: Home / Self Care | Payer: Medicaid Other | Source: Ambulatory Visit | Attending: Obstetrics and Gynecology | Admitting: Obstetrics and Gynecology

## 2021-10-05 ENCOUNTER — Other Ambulatory Visit: Payer: Self-pay

## 2021-10-05 ENCOUNTER — Ambulatory Visit (INDEPENDENT_AMBULATORY_CARE_PROVIDER_SITE_OTHER): Payer: Medicaid Other | Admitting: Obstetrics and Gynecology

## 2021-10-05 VITALS — BP 107/74 | HR 111 | Ht 64.0 in | Wt 146.0 lb

## 2021-10-05 DIAGNOSIS — Z3481 Encounter for supervision of other normal pregnancy, first trimester: Secondary | ICD-10-CM

## 2021-10-05 DIAGNOSIS — N809 Endometriosis, unspecified: Secondary | ICD-10-CM

## 2021-10-05 DIAGNOSIS — Z1379 Encounter for other screening for genetic and chromosomal anomalies: Secondary | ICD-10-CM

## 2021-10-05 DIAGNOSIS — Z8659 Personal history of other mental and behavioral disorders: Secondary | ICD-10-CM

## 2021-10-05 DIAGNOSIS — Z124 Encounter for screening for malignant neoplasm of cervix: Secondary | ICD-10-CM

## 2021-10-05 DIAGNOSIS — Z3A09 9 weeks gestation of pregnancy: Secondary | ICD-10-CM | POA: Diagnosis not present

## 2021-10-05 DIAGNOSIS — O26891 Other specified pregnancy related conditions, first trimester: Secondary | ICD-10-CM

## 2021-10-05 DIAGNOSIS — R102 Pelvic and perineal pain: Secondary | ICD-10-CM

## 2021-10-05 DIAGNOSIS — Z113 Encounter for screening for infections with a predominantly sexual mode of transmission: Secondary | ICD-10-CM

## 2021-10-05 DIAGNOSIS — O2341 Unspecified infection of urinary tract in pregnancy, first trimester: Secondary | ICD-10-CM

## 2021-10-05 DIAGNOSIS — N898 Other specified noninflammatory disorders of vagina: Secondary | ICD-10-CM

## 2021-10-05 LAB — OB RESULTS CONSOLE GC/CHLAMYDIA
Chlamydia: NEGATIVE
Neisseria Gonorrhea: NEGATIVE

## 2021-10-05 LAB — POCT URINALYSIS DIPSTICK OB
Bilirubin, UA: NEGATIVE
Glucose, UA: NEGATIVE
Ketones, UA: NEGATIVE
Leukocytes, UA: NEGATIVE
Nitrite, UA: NEGATIVE
POC,PROTEIN,UA: NEGATIVE
Spec Grav, UA: 1.025 (ref 1.010–1.025)
Urobilinogen, UA: 0.2 E.U./dL
pH, UA: 6.5 (ref 5.0–8.0)

## 2021-10-05 NOTE — Patient Instructions (Signed)
First Trimester of Pregnancy °The first trimester of pregnancy starts on the first day of your last menstrual period until the end of week 12. This is months 1 through 3 of pregnancy. A week after a sperm fertilizes an egg, the egg will implant into the wall of the uterus and begin to develop into a baby. By the end of 12 weeks, all the baby's organs will be formed and the baby will be 2-3 inches in size. °Body changes during your first trimester °Your body goes through many changes during pregnancy. The changes vary and generally return to normal after your baby is born. °Physical changes °You may gain or lose weight. °Your breasts may begin to grow larger and become tender. The tissue that surrounds your nipples (areola) may become darker. °Dark spots or blotches (chloasma or mask of pregnancy) may develop on your face. °You may have changes in your hair. These can include thickening or thinning of your hair or changes in texture. °Health changes °You may feel nauseous, and you may vomit. °You may have heartburn. °You may develop headaches. °You may develop constipation. °Your gums may bleed and may be sensitive to brushing and flossing. °Other changes °You may tire easily. °You may urinate more often. °Your menstrual periods will stop. °You may have a loss of appetite. °You may develop cravings for certain kinds of food. °You may have changes in your emotions from day to day. °You may have more vivid and strange dreams. °Follow these instructions at home: °Medicines °Follow your health care provider's instructions regarding medicine use. Specific medicines may be either safe or unsafe to take during pregnancy. Do not take any medicines unless told to by your health care provider. °Take a prenatal vitamin that contains at least 600 micrograms (mcg) of folic acid. °Eating and drinking °Eat a healthy diet that includes fresh fruits and vegetables, whole grains, good sources of protein such as meat, eggs, or tofu,  and low-fat dairy products. °Avoid raw meat and unpasteurized juice, milk, and cheese. These carry germs that can harm you and your baby. °If you feel nauseous or you vomit: °Eat 4 or 5 small meals a day instead of 3 large meals. °Try eating a few soda crackers. °Drink liquids between meals instead of during meals. °You may need to take these actions to prevent or treat constipation: °Drink enough fluid to keep your urine pale yellow. °Eat foods that are high in fiber, such as beans, whole grains, and fresh fruits and vegetables. °Limit foods that are high in fat and processed sugars, such as fried or sweet foods. °Activity °Exercise only as directed by your health care provider. Most people can continue their usual exercise routine during pregnancy. Try to exercise for 30 minutes at least 5 days a week. °Stop exercising if you develop pain or cramping in the lower abdomen or lower back. °Avoid exercising if it is very hot or humid or if you are at high altitude. °Avoid heavy lifting. °If you choose to, you may have sex unless your health care provider tells you not to. °Relieving pain and discomfort °Wear a good support bra to relieve breast tenderness. °Rest with your legs elevated if you have leg cramps or low back pain. °If you develop bulging veins (varicose veins) in your legs: °Wear support hose as told by your health care provider. °Elevate your feet for 15 minutes, 3-4 times a day. °Limit salt in your diet. °Safety °Wear your seat belt at all times when driving   or riding in a car. °Talk with your health care provider if someone is verbally or physically abusive to you. °Talk with your health care provider if you are feeling sad or have thoughts of hurting yourself. °Lifestyle °Do not use hot tubs, steam rooms, or saunas. °Do not douche. Do not use tampons or scented sanitary pads. °Do not use herbal remedies, alcohol, illegal drugs, or medicines that are not approved by your health care provider. Chemicals  in these products can harm your baby. °Do not use any products that contain nicotine or tobacco, such as cigarettes, e-cigarettes, and chewing tobacco. If you need help quitting, ask your health care provider. °Avoid cat litter boxes and soil used by cats. These carry germs that can cause birth defects in the baby and possibly loss of the unborn baby (fetus) by miscarriage or stillbirth. °General instructions °During routine prenatal visits in the first trimester, your health care provider will do a physical exam, perform necessary tests, and ask you how things are going. Keep all follow-up visits. This is important. °Ask for help if you have counseling or nutritional needs during pregnancy. Your health care provider can offer advice or refer you to specialists for help with various needs. °Schedule a dentist appointment. At home, brush your teeth with a soft toothbrush. Floss gently. °Write down your questions. Take them to your prenatal visits. °Where to find more information °American Pregnancy Association: americanpregnancy.org °American College of Obstetricians and Gynecologists: acog.org/en/Womens%20Health/Pregnancy °Office on Women's Health: womenshealth.gov/pregnancy °Contact a health care provider if you have: °Dizziness. °A fever. °Mild pelvic cramps, pelvic pressure, or nagging pain in the abdominal area. °Nausea, vomiting, or diarrhea that lasts for 24 hours or longer. °A bad-smelling vaginal discharge. °Pain when you urinate. °Known exposure to a contagious illness, such as chickenpox, measles, Zika virus, HIV, or hepatitis. °Get help right away if you have: °Spotting or bleeding from your vagina. °Severe abdominal cramping or pain. °Shortness of breath or chest pain. °Any kind of trauma, such as from a fall or a car crash. °New or increased pain, swelling, or redness in an arm or leg. °Summary °The first trimester of pregnancy starts on the first day of your last menstrual period until the end of week  12 (months 1 through 3). °Eating 4 or 5 small meals a day rather than 3 large meals may help to relieve nausea and vomiting. °Do not use any products that contain nicotine or tobacco, such as cigarettes, e-cigarettes, and chewing tobacco. If you need help quitting, ask your health care provider. °Keep all follow-up visits. This is important. °This information is not intended to replace advice given to you by your health care provider. Make sure you discuss any questions you have with your health care provider. °Document Revised: 02/19/2020 Document Reviewed: 12/26/2019 °Elsevier Patient Education © 2022 Elsevier Inc. ° °

## 2021-10-05 NOTE — Progress Notes (Signed)
OBSTETRIC INITIAL PRENATAL VISIT  Subjective:    Veronica Cooke is being seen today for her first obstetrical visit.  This is not a planned pregnancy. She is a 26 y.o. G4P2002 female at [redacted]w[redacted]d gestation, Estimated Date of Delivery: 04/08/22 with Patient's last menstrual period was 07/02/2021 (approximate).,  inconsistent with 7 week sono. Her obstetrical history is significant for  chronic pelvic pain and endometriosis.  Also noted concerns for uncertain paternity at her confirmation visit . Currently is married. Patient does intend to breast feed. Pregnancy history fully reviewed.  Notes her nausea/vomiting has gotten better. Currently on Compazine and Dicelgis. Was seen in the ER several weeks ago for this. Also was diagnosed with a UTI during that visit (was asymptomatic). Reports taking medications as prescribed.   OB History  Gravida Para Term Preterm AB Living  3 2 2  0 0 2  SAB IAB Ectopic Multiple Live Births  0 0 0 0 2    # Outcome Date GA Lbr Len/2nd Weight Sex Delivery Anes PTL Lv  3 Current           2 Term 01/05/18 [redacted]w[redacted]d 01:36 / 00:15 7 lb 4.1 oz (3.29 kg) F Vag-Spont EPI  LIV     Name: Longoria,GIRL Deitra     Apgar1: 8  Apgar5: 9  1 Term 02/13/16 [redacted]w[redacted]d 12:50 / 02:26 9 lb 1.7 oz (4.13 kg) M Vag-Spont EPI  LIV     Name: Skibinski,BOY Stevana     Apgar1: 8  Apgar5: 9    Gynecologic History:  Last pap smear was 2-3 years ago.  Results were Normal. Denies h/o abnormal pap smears in the past.  Denies history of STIs.  Contraception prior to conception: IUD   Past Medical History:  Diagnosis Date   Anemia    Anxiety    Endometriosis    Headache     Family History  Problem Relation Age of Onset   Gallbladder disease Mother    Cystinosis Mother    Healthy Father    Cancer Maternal Grandmother        Breast/ Ovarian   Stroke Maternal Grandfather    Diabetes Maternal Grandfather    Diabetes Maternal Aunt    Breast cancer Maternal Aunt 35    Past Surgical History:   Procedure Laterality Date   CHOLECYSTECTOMY N/A 05/21/2019   Procedure: LAPAROSCOPIC CHOLECYSTECTOMY WITH INTRAOPERATIVE CHOLANGIOGRAM;  Surgeon: 05/23/2019, MD;  Location: [facility];  Service: General;  Laterality: N/A;   DIAGNOSTIC LAPAROSCOPY  04/2021   to rule out ovarian cyst/torsion,   NO PAST SURGERIES     WISDOM TOOTH EXTRACTION Bilateral 2017    Social History   Socioeconomic History   Marital status: Married    Spouse name: Not on file   Number of children: Not on file   Years of education: Not on file   Highest education level: Not on file  Occupational History   Not on file  Tobacco Use   Smoking status: Never   Smokeless tobacco: Never  Vaping Use   Vaping Use: Some days  Substance and Sexual Activity   Alcohol use: No   Drug use: No   Sexual activity: Not Currently    Birth control/protection: None  Other Topics Concern   Not on file  Social History Narrative   Living Situation:  Lives with parents and siblings.     Sexual Activity:  She has been sexually active in the past. She is currently not sexually active.  Pets: Cat and Dog   Education: [employer]ying [employer] Assisting   Tobacco Use/Exposure:  None    Diet:  Regular   Exercise:  Plays outdoors   Hobbies: Theatre    Social Determinants of Health   Financial Resource Strain: Not on file  Food Insecurity: Not on file  Transportation Needs: Not on file  Physical Activity: Not on file  Stress: Not on file  Social Connections: Not on file  Intimate Partner Violence: Not on file    Current Outpatient Medications on File Prior to Visit  Medication Sig Dispense Refill   clonazePAM (KLONOPIN) 0.5 MG tablet Take 0.5 mg by mouth daily.     gabapentin (NEURONTIN) 600 MG tablet Take 600 mg by mouth at bedtime.     prochlorperazine (COMPAZINE) 10 MG tablet Take 1 tablet (10 mg total) by mouth every 8 (eight) hours as needed for nausea or vomiting. 15 tablet 0   No current facility-administered  medications on file prior to visit.    No Known Allergies   Review of Systems General: Not Present- Fever, Weight Loss and Weight Gain. Skin: Not Present- Rash. HEENT: Not Present- Blurred Vision, Headache and Bleeding Gums. Respiratory: Not Present- Difficulty Breathing. Breast: Not Present- Breast Mass. Cardiovascular: Not Present- Chest Pain, Elevated Blood Pressure, Fainting / Blacking Out and Shortness of Breath. Gastrointestinal: Not Present- Abdominal Pain, Constipation. Present - Nausea and Vomiting, improved. Female Genitourinary: Not Present- Frequency, Painful Urination, Pelvic Pain, Vaginal Bleeding, Vaginal Discharge, Contractions, regular, Fetal Movements Decreased, Urinary Complaints and Vaginal Fluid. Musculoskeletal: Not Present- Back Pain and Leg Cramps. Neurological: Not Present- Dizziness. Psychiatric: Not Present- Depression.     Objective:   Blood pressure 107/74, pulse (!) 111, weight 146 lb (66.2 kg), last menstrual period 07/02/2021. Body mass index is 25.06 kg/m.  General Appearance:    Alert, cooperative, no distress, appears stated age  Head:    Normocephalic, without obvious abnormality, atraumatic  Eyes:    PERRL, conjunctiva/corneas clear, EOM's intact, both eyes  Ears:    Normal external ear canals, both ears  Nose:   Nares normal, septum midline, mucosa normal, no drainage or sinus tenderness  Throat:   Lips, mucosa, and tongue normal; teeth and gums normal  Neck:   Supple, symmetrical, trachea midline, no adenopathy; thyroid: no enlargement/tenderness/nodules; no carotid bruit or JVD  Back:     Symmetric, no curvature, ROM normal, no CVA tenderness  Lungs:     Clear to auscultation bilaterally, respirations unlabored  Chest Wall:    No tenderness or deformity   Heart:    Regular rate and rhythm, S1 and S2 normal, no murmur, rub or gallop  Breast Exam:    No tenderness, masses, or nipple abnormality  Abdomen:     Soft, non-tender, bowel sounds  active all four quadrants, no masses, no organomegaly.  FHT unable to appreciate due to early gestation.  Genitalia:    Pelvic:external genitalia normal, vagina without lesions, or tenderness, moderate thick white discharge present. Rectovaginal septum  normal. Cervix normal in appearance, no cervical motion tenderness, no adnexal masses or tenderness.  Pregnancy positive findings: uterine enlargement: 9-10 wk size, nontender.   Rectal:    Normal external sphincter.  No hemorrhoids appreciated. Internal exam not done.   Extremities:   Extremities normal, atraumatic, no cyanosis or edema  Pulses:   2+ and symmetric all extremities  Skin:   Skin color, texture, turgor normal, no rashes or lesions  Lymph nodes:   Cervical, supraclavicular, and  axillary nodes normal  Neurologic:   CNII-XII intact, normal strength, sensation and reflexes throughout     Assessment:   1. Encounter for supervision of other normal pregnancy in first trimester   2. [redacted] weeks gestation of pregnancy   3. Screen for STD (sexually transmitted disease)   4. UTI in pregnancy, antepartum, first trimester   5. Vaginal discharge during pregnancy in first trimester   6. Genetic screening   7. Pelvic pain affecting pregnancy in first trimester, antepartum   8. History of anxiety   9. Cervical cancer screening   10. Endometriosis   11. Pelvic pain in female     Plan:   1. Encounter for supervision of other normal pregnancy in first trimester - Initial labs ordered. - Prenatal vitamins encouraged. - Problem list reviewed and updated. - New OB counseling:  The patient has been given an overview regarding routine prenatal care.  Recommendations regarding diet, weight gain, and exercise in pregnancy were given. - Prenatal testing, optional genetic testing, and ultrasound use in pregnancy were reviewed.  Traditional genetic screening vs cell-fee DNA genetic screening discussed, including risks and benefits. Testing ordered with  [id number]. - Benefits of Breast Feeding were discussed. The patient is encouraged to consider nursing her baby post partum. - The patient has Medicaid.  [facility] Medicaid Risk Screening Form completed today  2. UTI in pregnancy - Will order urine culture for TOC.  Still noting small amount of blood in urine today.   3.  Vaginal discharge in pregnancy - Nuswab ordered for vaginitis panel, also GC/CT performed for routine screening in early pregnancy  4. Pelvic pain affecting pregnancy in first trimester, antepartum/history of endometriosis -Patient with history of pelvic pain and endometriosis.  Was planning on hysterectomy prior to discovery of pregnancy.  I discussed with patient on use of pain medication including narcotics during pregnancy.  We will attempt to manage with Tylenol and pelvic physical therapy and if narcotics are required to utilize sparingly.  5. History of anxiety -Patient with history of anxiety and panic attacks.  Has prescription for Klonopin, notes that she uses sparingly.  I discussed risks versus benefits of utilizing benzodiazepines in pregnancy.  If patient begins to notice an increase in her anxiety or more frequent attacks, can consider alternative medication that is safe in pregnancy.  6. Cervical cancer screening -Pap smear performed today.    Follow up in 4 weeks.    Hildred Laser, MD [facility]

## 2021-10-06 LAB — URINALYSIS, ROUTINE W REFLEX MICROSCOPIC

## 2021-10-06 LAB — VARICELLA ZOSTER ANTIBODY, IGG: Varicella zoster IgG: 1011 index (ref >165)

## 2021-10-06 LAB — TOXOPLASMA ANTIBODIES- IGG AND  IGM
Toxoplasma Antibody- IgM: <3 AU/mL (ref 0.0–7.9)
Toxoplasma IgG Ratio: <3 IU/mL (ref 0.0–7.1)

## 2021-10-06 LAB — RUBELLA SCREEN: Rubella Antibodies, IGG: 2.09 index (ref >0.99)

## 2021-10-06 LAB — ANTIBODY SCREEN: Antibody Screen: NEGATIVE

## 2021-10-06 LAB — VIRAL HEPATITIS HBV, HCV
HCV Ab: 0.3 s/co ratio (ref 0.0–0.9)
Hep B Core Total Ab: NEGATIVE
Hep B Surface Ab, Qual: REACTIVE
Hepatitis B Surface Ag: NEGATIVE

## 2021-10-06 LAB — ABO AND RH: Rh Factor: POSITIVE

## 2021-10-06 LAB — RPR: RPR Ser Ql: NONREACTIVE

## 2021-10-06 LAB — CYTOLOGY - PAP: Diagnosis: NEGATIVE

## 2021-10-06 LAB — HIV ANTIBODY (ROUTINE TESTING W REFLEX): HIV Screen 4th Generation wRfx: NONREACTIVE

## 2021-10-06 LAB — HCV INTERPRETATION

## 2021-10-07 ENCOUNTER — Emergency Department
Admission: EM | Admit: 2021-10-07 | Discharge: 2021-10-07 | Disposition: A | Discharge status: Home / Self Care | Payer: Medicaid Other | Attending: Emergency Medicine | Admitting: Emergency Medicine

## 2021-10-07 ENCOUNTER — Encounter: Payer: Self-pay | Admitting: Emergency Medicine

## 2021-10-07 ENCOUNTER — Other Ambulatory Visit: Payer: Self-pay

## 2021-10-07 DIAGNOSIS — O211 Hyperemesis gravidarum with metabolic disturbance: Secondary | ICD-10-CM | POA: Insufficient documentation

## 2021-10-07 DIAGNOSIS — R112 Nausea with vomiting, unspecified: Secondary | ICD-10-CM

## 2021-10-07 DIAGNOSIS — E860 Dehydration: Secondary | ICD-10-CM | POA: Insufficient documentation

## 2021-10-07 DIAGNOSIS — R000 Tachycardia, unspecified: Secondary | ICD-10-CM | POA: Insufficient documentation

## 2021-10-07 DIAGNOSIS — N9489 Other specified conditions associated with female genital organs and menstrual cycle: Secondary | ICD-10-CM | POA: Diagnosis not present

## 2021-10-07 DIAGNOSIS — R1110 Vomiting, unspecified: Secondary | ICD-10-CM

## 2021-10-07 DIAGNOSIS — Z3A01 Less than 8 weeks gestation of pregnancy: Secondary | ICD-10-CM | POA: Insufficient documentation

## 2021-10-07 LAB — URINALYSIS, ROUTINE W REFLEX MICROSCOPIC
Bacteria, UA: NONE SEEN
Bilirubin Urine: NEGATIVE
Glucose, UA: NEGATIVE mg/dL
Ketones, ur: 80 mg/dL — AB
Nitrite: NEGATIVE
Protein, ur: 30 mg/dL — AB
Specific Gravity, Urine: 1.027 (ref 1.005–1.030)
pH: 5 (ref 5.0–8.0)

## 2021-10-07 LAB — CBC
HCT: 39.4 % (ref 36.0–46.0)
Hemoglobin: 13.5 g/dL (ref 12.0–15.0)
MCH: 28.8 pg (ref 26.0–34.0)
MCHC: 34.3 g/dL (ref 30.0–36.0)
MCV: 84.2 fL (ref 80.0–100.0)
Platelets: 362 10*3/uL (ref 150–400)
RBC: 4.68 MIL/uL (ref 3.87–5.11)
RDW: 11.9 % (ref 11.5–15.5)
WBC: 7.3 10*3/uL (ref 4.0–10.5)
nRBC: 0 % (ref 0.0–0.2)

## 2021-10-07 LAB — HCG, QUANTITATIVE, PREGNANCY: hCG, Beta Chain, Quant, S: 207464 m[IU]/mL — ABNORMAL HIGH (ref <5)

## 2021-10-07 LAB — COMPREHENSIVE METABOLIC PANEL
ALT: 26 U/L (ref 0–44)
AST: 24 U/L (ref 15–41)
Albumin: 3.8 g/dL (ref 3.5–5.0)
Alkaline Phosphatase: 59 U/L (ref 38–126)
Anion gap: 9 (ref 5–15)
BUN: 10 mg/dL (ref 6–20)
CO2: 24 mmol/L (ref 22–32)
Calcium: 9.2 mg/dL (ref 8.9–10.3)
Chloride: 104 mmol/L (ref 98–111)
Creatinine, Ser: 0.48 mg/dL (ref 0.44–1.00)
GFR, Estimated: >60 mL/min (ref >60)
Glucose, Bld: 124 mg/dL — ABNORMAL HIGH (ref 70–99)
Potassium: 3.9 mmol/L (ref 3.5–5.1)
Sodium: 137 mmol/L (ref 135–145)
Total Bilirubin: 0.6 mg/dL (ref 0.3–1.2)
Total Protein: 7.4 g/dL (ref 6.5–8.1)

## 2021-10-07 LAB — LIPASE, BLOOD: Lipase: 25 U/L (ref 11–51)

## 2021-10-07 LAB — PARVOVIRUS B19 ANTIBODY, IGG AND IGM
Parvovirus B19 IgG: 0.2 index (ref 0.0–0.8)
Parvovirus B19 IgM: 0.1 index (ref 0.0–0.8)

## 2021-10-07 LAB — SPECIMEN STATUS REPORT

## 2021-10-07 LAB — MAGNESIUM: Magnesium: 2 mg/dL (ref 1.7–2.4)

## 2021-10-07 LAB — CULTURE, OB URINE

## 2021-10-07 LAB — URINE CULTURE, OB REFLEX

## 2021-10-07 MED ORDER — DIPHENHYDRAMINE HCL 50 MG/ML IJ SOLN
25.0000 mg | Freq: Once | INTRAMUSCULAR | Status: AC
  Administered 2021-10-07: 25 mg via INTRAVENOUS
  Filled 2021-10-07: qty 1

## 2021-10-07 MED ORDER — ONDANSETRON 4 MG PO TBDP: 4.0000 mg | ORAL_TABLET | Freq: Once | ORAL | Status: DC | PRN

## 2021-10-07 MED ORDER — PYRIDOXINE HCL 100 MG/ML IJ SOLN
100.0000 mg | Freq: Once | INTRAMUSCULAR | Status: AC
Start: 2021-10-07 — End: 2021-10-07
  Administered 2021-10-07: 100 mg via INTRAVENOUS
  Filled 2021-10-07: qty 1

## 2021-10-07 MED ORDER — PROCHLORPERAZINE EDISYLATE 10 MG/2ML IJ SOLN
10.0000 mg | Freq: Once | INTRAMUSCULAR | Status: AC
Start: 2021-10-07 — End: 2021-10-07
  Administered 2021-10-07: 10 mg via INTRAVENOUS
  Filled 2021-10-07: qty 2

## 2021-10-07 MED ORDER — PANTOPRAZOLE SODIUM 40 MG IV SOLR
40.0000 mg | Freq: Once | INTRAVENOUS | Status: AC
Start: 2021-10-07 — End: 2021-10-07
  Administered 2021-10-07: 40 mg via INTRAVENOUS
  Filled 2021-10-07: qty 40

## 2021-10-07 MED ORDER — ONDANSETRON HCL 4 MG/2ML IJ SOLN
4.0000 mg | Freq: Once | INTRAMUSCULAR | Status: AC
  Administered 2021-10-07: 4 mg via INTRAVENOUS
  Filled 2021-10-07: qty 2

## 2021-10-07 MED ORDER — LACTATED RINGERS IV BOLUS
1500.0000 mL | Freq: Once | INTRAVENOUS | Status: AC
  Administered 2021-10-07: 1500 mL via INTRAVENOUS

## 2021-10-07 NOTE — ED Notes (Signed)
Pt states she is currently [redacted] weeks pregnant and has had N/V since Monday. Pt states she had this with her first pregnancy as well. Pt states she has not been around anyone who is ill.

## 2021-10-07 NOTE — ED Triage Notes (Signed)
Pt comes into the ED via POV c/o hematemesis that started this morning.  Pt currently is [redacted] weeks pregnant.  Pt seen here on 1/2 for similar problem of hyperemesis.  Pt in NAD at this time with even and unlabored respirations.

## 2021-10-07 NOTE — ED Notes (Signed)
Pt started to complain that she felt anxious after IVP meds, Compazine was give and diluted per admin and instructions (10 ml) and 10 mg was pushed over 2 minutes, MD aware, see new orders.   Pt state she feels better after benadryl.

## 2021-10-07 NOTE — ED Provider Notes (Signed)
[facility] Provider Note    Event Date/Time   First MD Initiated Contact with Patient 10/07/21 1321     (approximate)   History   Hematemesis   HPI  Veronica Cooke is a 26 y.o. female  G21P2002 female at [redacted]w[redacted]d gestation as well as a history of chronic pelvic pain and endometriosis who presents for assessment of some acute on chronic nausea and vomiting associate with some substernal chest pressure and some soreness in her epigastrium.  Patient states she saw some flecks of blood in her vomit today as well.  She states this happened last time she was in the emergency room on 1/2 and had been feeling better on Compazine and Diclegis she had been taking at home but feels that she has not been able to keep any of these down over the last 48 hours and feels like her nausea and vomiting is getting worse.  She denies any other new abdominal pain, back pain, cough, shortness of breath, fevers, headache, earache, sore throat, trauma, rash, extremity pain, urinary symptoms or any other acute concerns at this time.    Physical Exam  Triage Vital Signs: ED Triage Vitals  Enc Vitals Group     BP 10/07/21 1218 127/89     Pulse Rate 10/07/21 1218 (!) 130     Resp 10/07/21 1218 20     Temp 10/07/21 1218 98.2 F (36.8 C)     Temp Source 10/07/21 1218 Oral     SpO2 10/07/21 1218 100 %     Weight 10/07/21 1208 146 lb (66.2 kg)     Height 10/07/21 1208 5\' 2"  (1.575 m)     Head Circumference --      Peak Flow --      Pain Score 10/07/21 1208 7     Pain Loc --      Pain Edu? --      Excl. in [place]? --     Most recent vital signs: Vitals:   10/07/21 1218 10/07/21 1430  BP: 127/89 106/62  Pulse: (!) 130 85  Resp: 20 10  Temp: 98.2 F (36.8 C)   SpO2: 100% 99%    General: Awake, appears mildly uncomfortable CV:  Dry mucous membranes.  Slightly prolonged capillary refill.  2+ radial pulses.  Patient is tachycardic.  No murmurs rubs or gallops. Resp:  Normal effort.   Clear bilaterally. Abd:  No distention.  Mild tenderness in epigastrium. Other:  No significant CVA tenderness patient is otherwise not guarding or rigid or and is without significant lower abdominal tenderness.   ED Results / Procedures / Treatments  Labs (all labs ordered are listed, but only abnormal results are displayed) Labs Reviewed  COMPREHENSIVE METABOLIC PANEL - Abnormal; Notable for the following components:      Result Value   Glucose, Bld 124 (*)    All other components within normal limits  URINALYSIS, ROUTINE W REFLEX MICROSCOPIC - Abnormal; Notable for the following components:   Color, Urine YELLOW (*)    APPearance CLOUDY (*)    Hgb urine dipstick SMALL (*)    Ketones, ur 80 (*)    Protein, ur 30 (*)    Leukocytes,Ua LARGE (*)    All other components within normal limits  HCG, QUANTITATIVE, PREGNANCY - Abnormal; Notable for the following components:   hCG, Beta Chain, 12/05/21 Mahalia Longest (*)    All other components within normal limits  URINE CULTURE  LIPASE, BLOOD  CBC  MAGNESIUM  EKG  ECG remarkable sinus rhythm with a ventricular rate of 81, normal axis, unremarkable intervals without evidence of acute ischemia or significant arrhythmia.   RADIOLOGY    PROCEDURES:  Critical Care performed: No  .1-3 Lead EKG Interpretation Performed by: Gilles Chiquito, MD Authorized by: Gilles Chiquito, MD     Interpretation: abnormal     ECG rate assessment: tachycardic     Rhythm: sinus tachycardia     Ectopy: none     Conduction: normal    The patient is on the cardiac monitor to evaluate for evidence of arrhythmia and/or significant heart rate changes.   MEDICATIONS ORDERED IN ED: Medications  ondansetron (ZOFRAN) injection 4 mg (4 mg Intravenous Given 10/07/21 1219)  lactated ringers bolus 1,500 mL (1,500 mLs Intravenous Bolus 10/07/21 1414)  prochlorperazine (COMPAZINE) injection 10 mg (10 mg Intravenous Given 10/07/21 1414)  pyridOXINE (B-6)  injection 100 mg (100 mg Intravenous Given 10/07/21 1554)  pantoprazole (PROTONIX) injection 40 mg (40 mg Intravenous Given 10/07/21 1414)  diphenhydrAMINE (BENADRYL) injection 25 mg (25 mg Intravenous Given 10/07/21 1440)     IMPRESSION / MDM / ASSESSMENT AND PLAN / ED COURSE  I reviewed the triage vital signs and the nursing notes.                              Differential diagnosis includes, but is not limited to, nausea and vomiting with possibly some hematemesis related to Mallory-Weiss tear in the setting of hyperemesis.  Additional considerations include possible infectious gastroenteritis, recurrent UTI with a lower suspicion based on patient's EKG for atypical presentation for ACS.  I am also concerned he may have developed some electrolyte or metabolic derangements and does appear dehydrated on exam.  I reviewed patient's most recent OB visit from 1/10 where it seems that time she had been doing well with the Compazine and Diclegis.  CBC today shows no leukocytosis or acute anemia.  CMP without any significant lecture light or metabolic derangements.  Lipase not consistent with acute pancreatitis.  Magnesium is within normal limits.  hCG in expected range at 207,000.  UA has some leukocyte esterase previously seen 10 days ago although there are some squamous epithelial cells as well.  There is no nitrites or bacteria seen and only 11-20 WBCs..  Previously patient had 50 WBCs.  EKG not suggestive of atypical presentation for ACS.  I reviewed culture obtained on the 10th "no abnormal bacteria.  Patient is denying any urinary symptoms on my reassessment and overall I have a low suspicion for clinically significant cystitis at this time.  She also states she is feeling drastically better with regards to her chest pain abdominal pain nausea and vomiting.  She is able tolerate p.o. without difficulty.  Suspect likely some hyperemesis gravidarum.  Given she is feeling better with resolution of her  tachycardia and able tolerate p.o. with otherwise reassuring work-up I think she is stable for discharge with close outpatient OB follow-up.  Low suspicion for other emergent thoracic or acute abdominal process at this time.  Discharged in stable condition.  She states she does not need refills of her Compazine or Diclegis.      FINAL CLINICAL IMPRESSION(S) / ED DIAGNOSES   Final diagnoses:  Nausea and vomiting, unspecified vomiting type  Hyperemesis  Dehydration     Rx / DC Orders   ED Discharge Orders     None  Note:  This document was prepared using Dragon voice recognition software and may include unintentional dictation errors.   Gilles ChiquitoSmith, Americo Vallery P, MD 10/07/21 [phone]

## 2021-10-08 LAB — URINE CULTURE: Culture: 90000 — AB

## 2021-10-10 LAB — MATERNIT21  PLUS CORE+ESS+SCA, BLOOD

## 2021-10-10 LAB — NUSWAB VAGINITIS PLUS (VG+)
Candida albicans, NAA: POSITIVE — AB
Candida glabrata, NAA: NEGATIVE
Chlamydia trachomatis, NAA: NEGATIVE
Neisseria gonorrhoeae, NAA: NEGATIVE
Trich vag by NAA: NEGATIVE

## 2021-10-28 ENCOUNTER — Telehealth: Payer: Self-pay | Admitting: Obstetrics and Gynecology

## 2021-10-28 ENCOUNTER — Other Ambulatory Visit: Payer: Self-pay

## 2021-10-28 MED ORDER — PROCHLORPERAZINE MALEATE 10 MG PO TABS: 10.0000 mg | ORAL_TABLET | Freq: Three times a day (TID) | ORAL | 0 refills | Status: DC | PRN

## 2021-10-28 NOTE — Telephone Encounter (Signed)
Sent in prescription for Compazine.

## 2021-10-28 NOTE — Telephone Encounter (Signed)
Pt called asking if Dr.Cherry can refill nausea medication that was originally given by ER doctor. Please Advise.

## 2021-11-02 ENCOUNTER — Other Ambulatory Visit: Payer: Self-pay

## 2021-11-02 ENCOUNTER — Telehealth: Payer: Self-pay | Admitting: Obstetrics and Gynecology

## 2021-11-02 ENCOUNTER — Emergency Department
Admission: EM | Admit: 2021-11-02 | Discharge: 2021-11-02 | Disposition: A | Discharge status: Home / Self Care | Payer: BC Managed Care – PPO | Attending: Emergency Medicine | Admitting: Emergency Medicine

## 2021-11-02 ENCOUNTER — Encounter: Payer: Medicaid Other | Admitting: Obstetrics and Gynecology

## 2021-11-02 DIAGNOSIS — O219 Vomiting of pregnancy, unspecified: Secondary | ICD-10-CM

## 2021-11-02 DIAGNOSIS — Z3A13 13 weeks gestation of pregnancy: Secondary | ICD-10-CM | POA: Diagnosis not present

## 2021-11-02 DIAGNOSIS — O211 Hyperemesis gravidarum with metabolic disturbance: Secondary | ICD-10-CM | POA: Diagnosis not present

## 2021-11-02 LAB — COMPREHENSIVE METABOLIC PANEL
ALT: 16 U/L (ref 0–44)
AST: 20 U/L (ref 15–41)
Albumin: 3.3 g/dL — ABNORMAL LOW (ref 3.5–5.0)
Alkaline Phosphatase: 56 U/L (ref 38–126)
Anion gap: 9 (ref 5–15)
BUN: 9 mg/dL (ref 6–20)
CO2: 22 mmol/L (ref 22–32)
Calcium: 8.7 mg/dL — ABNORMAL LOW (ref 8.9–10.3)
Chloride: 105 mmol/L (ref 98–111)
Creatinine, Ser: 0.45 mg/dL (ref 0.44–1.00)
GFR, Estimated: >60 mL/min (ref >60)
Glucose, Bld: 104 mg/dL — ABNORMAL HIGH (ref 70–99)
Potassium: 3.7 mmol/L (ref 3.5–5.1)
Sodium: 136 mmol/L (ref 135–145)
Total Bilirubin: 0.5 mg/dL (ref 0.3–1.2)
Total Protein: 6.7 g/dL (ref 6.5–8.1)

## 2021-11-02 LAB — CBC WITH DIFFERENTIAL/PLATELET
Abs Immature Granulocytes: 0.07 10*3/uL (ref 0.00–0.07)
Basophils Absolute: 0 10*3/uL (ref 0.0–0.1)
Basophils Relative: 0 %
Eosinophils Absolute: 0.2 10*3/uL (ref 0.0–0.5)
Eosinophils Relative: 2 %
HCT: 37.6 % (ref 36.0–46.0)
Hemoglobin: 12.8 g/dL (ref 12.0–15.0)
Immature Granulocytes: 1 %
Lymphocytes Relative: 17 %
Lymphs Abs: 1.7 10*3/uL (ref 0.7–4.0)
MCH: 28.8 pg (ref 26.0–34.0)
MCHC: 34 g/dL (ref 30.0–36.0)
MCV: 84.5 fL (ref 80.0–100.0)
Monocytes Absolute: 0.4 10*3/uL (ref 0.1–1.0)
Monocytes Relative: 4 %
Neutro Abs: 7.6 10*3/uL (ref 1.7–7.7)
Neutrophils Relative %: 76 %
Platelets: 368 10*3/uL (ref 150–400)
RBC: 4.45 MIL/uL (ref 3.87–5.11)
RDW: 12.1 % (ref 11.5–15.5)
WBC: 10.1 10*3/uL (ref 4.0–10.5)
nRBC: 0 % (ref 0.0–0.2)

## 2021-11-02 LAB — HCG, QUANTITATIVE, PREGNANCY: hCG, Beta Chain, Quant, S: 90682 m[IU]/mL — ABNORMAL HIGH (ref <5)

## 2021-11-02 LAB — LIPASE, BLOOD: Lipase: 27 U/L (ref 11–51)

## 2021-11-02 LAB — MAGNESIUM: Magnesium: 1.7 mg/dL (ref 1.7–2.4)

## 2021-11-02 MED ORDER — SODIUM CHLORIDE 0.9 % IV BOLUS
1000.0000 mL | Freq: Once | INTRAVENOUS | Status: AC
  Administered 2021-11-02: 1000 mL via INTRAVENOUS

## 2021-11-02 MED ORDER — DIPHENHYDRAMINE HCL 50 MG/ML IJ SOLN
25.0000 mg | Freq: Once | INTRAMUSCULAR | Status: AC
  Administered 2021-11-02: 25 mg via INTRAVENOUS
  Filled 2021-11-02: qty 1

## 2021-11-02 MED ORDER — FAMOTIDINE 20 MG PO TABS: 20.0000 mg | ORAL_TABLET | Freq: Two times a day (BID) | ORAL | 0 refills | Status: DC

## 2021-11-02 MED ORDER — METOCLOPRAMIDE HCL 5 MG/ML IJ SOLN
10.0000 mg | Freq: Once | INTRAMUSCULAR | Status: AC
  Administered 2021-11-02: 10 mg via INTRAVENOUS
  Filled 2021-11-02: qty 2

## 2021-11-02 MED ORDER — FAMOTIDINE IN NACL 20-0.9 MG/50ML-% IV SOLN
20.0000 mg | Freq: Once | INTRAVENOUS | Status: AC
  Administered 2021-11-02: 20 mg via INTRAVENOUS
  Filled 2021-11-02: qty 50

## 2021-11-02 MED ORDER — DOXYLAMINE-PYRIDOXINE 10-10 MG PO TBEC: 10.0000 mg | DELAYED_RELEASE_TABLET | ORAL | 0 refills | Status: AC | PRN
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
## 2021-11-02 NOTE — ED Notes (Signed)
Pt given a blanket

## 2021-11-02 NOTE — Telephone Encounter (Signed)
Called pt to reschedule cancelled appt. Did not receive an answer. Left VM for patient to call the office for reschedule.

## 2021-11-02 NOTE — ED Triage Notes (Signed)
Pt to ED for continued emesis with pregnancy. [redacted] weeks pregnant. Reports today emesis had some specs of blood in it.

## 2021-11-02 NOTE — ED Notes (Signed)
Patient c/o N/V during pregnancy. Reports some streaks of red in emesis

## 2021-11-02 NOTE — ED Notes (Addendum)
Pt nauseous.  Blood work sent.

## 2021-11-03 ENCOUNTER — Telehealth: Payer: Self-pay | Admitting: Obstetrics and Gynecology

## 2021-11-03 ENCOUNTER — Other Ambulatory Visit: Payer: Self-pay

## 2021-11-03 DIAGNOSIS — O219 Vomiting of pregnancy, unspecified: Secondary | ICD-10-CM

## 2021-11-03 MED ORDER — PROCHLORPERAZINE MALEATE 10 MG PO TABS: 10.0000 mg | ORAL_TABLET | Freq: Three times a day (TID) | ORAL | 0 refills | Status: DC | PRN

## 2021-11-03 NOTE — Telephone Encounter (Signed)
Called patient. Informed her rx was sent to pharmacy. Also discussed Vitamin B-6 and Unisom with patient. She verbalized understanding.

## 2021-11-03 NOTE — Telephone Encounter (Signed)
Pt is scheduled for hospital F/U on Friday at 11:45am. Patient stated she needed a RX to be sent to her pharmacy for nausea. Stated that the RX from the hospital is not covered by her insurance. Pt stated that if possible can the RX be [facility]. Please Advise.

## 2021-11-05 ENCOUNTER — Encounter: Payer: Self-pay | Admitting: Obstetrics and Gynecology

## 2021-11-05 ENCOUNTER — Other Ambulatory Visit: Payer: Self-pay

## 2021-11-05 ENCOUNTER — Ambulatory Visit (INDEPENDENT_AMBULATORY_CARE_PROVIDER_SITE_OTHER): Payer: Medicaid Other | Admitting: Obstetrics and Gynecology

## 2021-11-05 VITALS — BP 105/64 | HR 98 | Wt 155.0 lb

## 2021-11-05 DIAGNOSIS — Z3481 Encounter for supervision of other normal pregnancy, first trimester: Secondary | ICD-10-CM

## 2021-11-05 DIAGNOSIS — Z3A13 13 weeks gestation of pregnancy: Secondary | ICD-10-CM

## 2021-11-05 DIAGNOSIS — O26892 Other specified pregnancy related conditions, second trimester: Secondary | ICD-10-CM

## 2021-11-05 DIAGNOSIS — R102 Pelvic and perineal pain: Secondary | ICD-10-CM

## 2021-11-05 DIAGNOSIS — O219 Vomiting of pregnancy, unspecified: Secondary | ICD-10-CM

## 2021-11-05 LAB — POCT URINALYSIS DIPSTICK OB
Bilirubin, UA: NEGATIVE
Glucose, UA: NEGATIVE
Ketones, UA: NEGATIVE
Nitrite, UA: NEGATIVE
POC,PROTEIN,UA: NEGATIVE
Spec Grav, UA: 1.02 (ref 1.010–1.025)
Urobilinogen, UA: 0.2 E.U./dL
pH, UA: 6.5 (ref 5.0–8.0)

## 2021-11-05 MED ORDER — METOCLOPRAMIDE HCL 10 MG PO TABS: 10.0000 mg | ORAL_TABLET | Freq: Four times a day (QID) | ORAL | 2 refills | Status: DC | PRN

## 2021-11-05 NOTE — Progress Notes (Signed)
ROB: Patient reports recently being seen in the ER several days ago due to nausea and vomiting not controlled with Zofran.  Also noted that she had run out of her Diclegis.  Refill given, also will change from Zofran to Reglan.  Will also place referral to PT as patient noting more pelvic pain (has h/o endometriosis and chronic pelvic pain prior to pregnancy). RTC in 4 weeks, for anatomy scan at that time.

## 2021-11-05 NOTE — Patient Instructions (Signed)
Second Trimester of Pregnancy °The second trimester of pregnancy is from week 13 through week 27. This is also called months 4 through 6 of pregnancy. This is often the time when you feel your best. °During the second trimester: °Morning sickness is less or has stopped. °You may have more energy. °You may feel hungry more often. °At this time, your unborn baby (fetus) is growing very fast. At the end of the sixth month, the unborn baby may be up to 12 inches long and weigh about 1½ pounds. You will likely start to feel the baby move between 16 and 20 weeks of pregnancy. °Body changes during your second trimester °Your body continues to go through many changes during this time. The changes vary and generally return to normal after the baby is born. °Physical changes °You will gain more weight. °You may start to get stretch marks on your hips, belly (abdomen), and breasts. °Your breasts will grow and may hurt. °Dark spots or blotches may develop on your face. °A dark line from your belly button to the pubic area (linea nigra) may appear. °You may have changes in your hair. °Health changes °You may have headaches. °You may have heartburn. °You may have trouble pooping (constipation). °You may have hemorrhoids or swollen, bulging veins (varicose veins). °Your gums may bleed. °You may pee (urinate) more often. °You may have back pain. °Follow these instructions at home: °Medicines °Take over-the-counter and prescription medicines only as told by your doctor. Some medicines are not safe during pregnancy. °Take a prenatal vitamin that contains at least 600 micrograms (mcg) of folic acid. °Eating and drinking °Eat healthy meals that include: °Fresh fruits and vegetables. °Whole grains. °Good sources of protein, such as meat, eggs, or tofu. °Low-fat dairy products. °Avoid raw meat and unpasteurized juice, milk, and cheese. °You may need to take these actions to prevent or treat trouble pooping: °Drink enough fluids to keep  your pee (urine) pale yellow. °Eat foods that are high in fiber. These include beans, whole grains, and fresh fruits and vegetables. °Limit foods that are high in fat and sugar. These include fried or sweet foods. °Activity °Exercise only as told by your doctor. Most people can do their usual exercise during pregnancy. Try to exercise for 30 minutes at least 5 days a week. °Stop exercising if you have pain or cramps in your belly or lower back. °Do not exercise if it is too hot or too humid, or if you are in a place of great height (high altitude). °Avoid heavy lifting. °If you choose to, you may have sex unless your doctor tells you not to. °Relieving pain and discomfort °Wear a good support bra if your breasts are sore. °Take warm water baths (sitz baths) to soothe pain or discomfort caused by hemorrhoids. Use hemorrhoid cream if your doctor approves. °Rest with your legs raised (elevated) if you have leg cramps or low back pain. °If you develop bulging veins in your legs: °Wear support hose as told by your doctor. °Raise your feet for 15 minutes, 3-4 times a day. °Limit salt in your food. °Safety °Wear your seat belt at all times when you are in a car. °Talk with your doctor if someone is hurting you or yelling at you a lot. °Lifestyle °Do not use hot tubs, steam rooms, or saunas. °Do not douche. Do not use tampons or scented sanitary pads. °Avoid cat litter boxes and soil used by cats. These carry germs that can harm your baby and   can cause a loss of your baby by miscarriage or stillbirth. °Do not use herbal medicines, illegal drugs, or medicines that are not approved by your doctor. Do not drink alcohol. °Do not smoke or use any products that contain nicotine or tobacco. If you need help quitting, ask your doctor. °General instructions °Keep all follow-up visits. This is important. °Ask your doctor about local prenatal classes. °Ask your doctor about the right foods to eat or for help finding a  counselor. °Where to find more information °American Pregnancy Association: americanpregnancy.org °American College of Obstetricians and Gynecologists: www.acog.org °Office on Women's Health: womenshealth.gov/pregnancy °Contact a doctor if: °You have a headache that does not go away when you take medicine. °You have changes in how you see, or you see spots in front of your eyes. °You have mild cramps, pressure, or pain in your lower belly. °You continue to feel like you may vomit (nauseous), you vomit, or you have watery poop (diarrhea). °You have bad-smelling fluid coming from your vagina. °You have pain when you pee or your pee smells bad. °You have very bad swelling of your face, hands, ankles, feet, or legs. °You have a fever. °Get help right away if: °You are leaking fluid from your vagina. °You have spotting or bleeding from your vagina. °You have very bad belly cramping or pain. °You have trouble breathing. °You have chest pain. °You faint. °You have not felt your baby move for the time period told by your doctor. °You have new or increased pain, swelling, or redness in an arm or leg. °Summary °The second trimester of pregnancy is from week 13 through week 27 (months 4 through 6). °Eat healthy meals. °Exercise as told by your doctor. Most people can do their usual exercise during pregnancy. °Do not use herbal medicines, illegal drugs, or medicines that are not approved by your doctor. Do not drink alcohol. °Call your doctor if you get sick or if you notice anything unusual about your pregnancy. °This information is not intended to replace advice given to you by your health care provider. Make sure you discuss any questions you have with your health care provider. °Document Revised: 02/19/2020 Document Reviewed: 12/26/2019 °Elsevier Patient Education © 2022 [employer] ° °

## 2021-11-10 ENCOUNTER — Encounter: Payer: Self-pay | Admitting: Obstetrics and Gynecology

## 2021-11-18 ENCOUNTER — Telehealth: Payer: Self-pay | Admitting: Obstetrics and Gynecology

## 2021-11-18 NOTE — Telephone Encounter (Signed)
Pt is wanting to know what options there were for a paternity test for her baby. Please advise.

## 2021-11-22 ENCOUNTER — Other Ambulatory Visit: Payer: Self-pay

## 2021-11-22 ENCOUNTER — Emergency Department
Admission: EM | Admit: 2021-11-22 | Discharge: 2021-11-22 | Disposition: A | Discharge status: Home / Self Care | Payer: BC Managed Care – PPO | Attending: Emergency Medicine | Admitting: Emergency Medicine

## 2021-11-22 DIAGNOSIS — Z3A16 16 weeks gestation of pregnancy: Secondary | ICD-10-CM | POA: Insufficient documentation

## 2021-11-22 DIAGNOSIS — O219 Vomiting of pregnancy, unspecified: Secondary | ICD-10-CM | POA: Diagnosis not present

## 2021-11-22 DIAGNOSIS — O210 Mild hyperemesis gravidarum: Secondary | ICD-10-CM

## 2021-11-22 LAB — CBC
HCT: 37.5 % (ref 36.0–46.0)
Hemoglobin: 12.5 g/dL (ref 12.0–15.0)
MCH: 28.3 pg (ref 26.0–34.0)
MCHC: 33.3 g/dL (ref 30.0–36.0)
MCV: 84.8 fL (ref 80.0–100.0)
Platelets: 333 10*3/uL (ref 150–400)
RBC: 4.42 MIL/uL (ref 3.87–5.11)
RDW: 12.5 % (ref 11.5–15.5)
WBC: 10.2 10*3/uL (ref 4.0–10.5)
nRBC: 0 % (ref 0.0–0.2)

## 2021-11-22 LAB — COMPREHENSIVE METABOLIC PANEL
ALT: 17 U/L (ref 0–44)
AST: 16 U/L (ref 15–41)
Albumin: 3.5 g/dL (ref 3.5–5.0)
Alkaline Phosphatase: 53 U/L (ref 38–126)
Anion gap: 11 (ref 5–15)
BUN: 7 mg/dL (ref 6–20)
CO2: 22 mmol/L (ref 22–32)
Calcium: 9.1 mg/dL (ref 8.9–10.3)
Chloride: 102 mmol/L (ref 98–111)
Creatinine, Ser: 0.48 mg/dL (ref 0.44–1.00)
GFR, Estimated: >60 mL/min (ref >60)
Glucose, Bld: 99 mg/dL (ref 70–99)
Potassium: 4.1 mmol/L (ref 3.5–5.1)
Sodium: 135 mmol/L (ref 135–145)
Total Bilirubin: 0.4 mg/dL (ref 0.3–1.2)
Total Protein: 6.7 g/dL (ref 6.5–8.1)

## 2021-11-22 LAB — LIPASE, BLOOD: Lipase: 26 U/L (ref 11–51)

## 2021-11-22 MED ORDER — LACTATED RINGERS IV BOLUS
1000.0000 mL | Freq: Once | INTRAVENOUS | Status: AC
  Administered 2021-11-22: 1000 mL via INTRAVENOUS

## 2021-11-22 MED ORDER — ONDANSETRON HCL 4 MG/2ML IJ SOLN
4.0000 mg | Freq: Once | INTRAMUSCULAR | Status: AC
  Administered 2021-11-22: 4 mg via INTRAVENOUS
  Filled 2021-11-22: qty 2

## 2021-11-22 MED ORDER — PROMETHAZINE HCL 12.5 MG RE SUPP: 12.5000 mg | Freq: Four times a day (QID) | RECTAL | 0 refills | Status: DC | PRN

## 2021-11-22 MED ORDER — ONDANSETRON 4 MG PO TBDP
4.0000 mg | ORAL_TABLET | Freq: Once | ORAL | Status: DC
  Filled 2021-11-22: qty 1

## 2021-11-22 MED ORDER — METOCLOPRAMIDE HCL 10 MG PO TABS: 10.0000 mg | ORAL_TABLET | Freq: Four times a day (QID) | ORAL | 2 refills | Status: DC | PRN

## 2021-11-22 NOTE — ED Provider Triage Note (Signed)
Emergency Medicine Provider Triage Evaluation Note  Veronica Cooke , a 26 y.o. female  was evaluated in triage.  Pt complains of nausea/vomiting/diarrhea.  [redacted] weeks pregnant.  Review of Systems  Positive: N/V/D, [redacted] weeks pregnant Negative: Fever  Physical Exam  BP 121/72 (BP Location: Left Arm)    Pulse 99    Temp 98.9 F (37.2 C) (Oral)    Resp 19    Ht 5\' 4"  (1.626 m)    Wt 68.9 kg    LMP 07/02/2021 (Approximate)    SpO2 99%    BMI 26.09 kg/m  Gen:   Awake, no distress   Resp:  Normal effort  MSK:   Moves extremities without difficulty  Other:    Medical Decision Making  Medically screening exam initiated at 1:11 PM.  Appropriate orders placed.  Jeryn Rayborn was informed that the remainder of the evaluation will be completed by another provider, this initial triage assessment does not replace that evaluation, and the importance of remaining in the ED until their evaluation is complete.  Ordered Zofran ODT.  Patient requesting IV.  Explained to her that we can do that when she gets into the room   Versie Starks, PA-C 11/22/21 1312

## 2021-11-22 NOTE — ED Triage Notes (Signed)
Pt comes with c/o vomiting and is [redacted] weeks pregnant. Pt seen here recently for same.

## 2021-11-22 NOTE — ED Notes (Signed)
Pt has vomited 7 times this morning, also yesterday. [redacted] weeks pregnant, has had multiple episodes of emesis this pregnancy. Pt is G3P2. Pt states is very nauseous and requests IV zofran instead of ODT and also IV fluids. EDP informed.

## 2021-11-22 NOTE — ED Provider Notes (Signed)
[facility] Provider Note    Event Date/Time   First MD Initiated Contact with Patient 11/22/21 1335     (approximate)   History   Emesis   HPI  Veronica Cooke is a 26 y.o. female who presents with nausea vomiting.  Patient is approximately [redacted] weeks pregnant.  This is her third pregnancy.  She has significant morning sickness with her first pregnancy in about this pregnancy.  She has been to emergency department several times for similar complaints and typically feels better after IV fluids and nausea medication.  She reports she ran out of her Reglan yesterday     Physical Exam   Triage Vital Signs: ED Triage Vitals  Enc Vitals Group     BP 11/22/21 1307 121/72     Pulse Rate 11/22/21 1307 99     Resp 11/22/21 1307 19     Temp 11/22/21 1307 98.9 F (37.2 C)     Temp Source 11/22/21 1307 Oral     SpO2 11/22/21 1307 99 %     Weight 11/22/21 1308 68.9 kg (152 lb)     Height 11/22/21 1308 1.626 m (5\' 4" )     Head Circumference --      Peak Flow --      Pain Score 11/22/21 1256 0     Pain Loc --      Pain Edu? --      Excl. in [place]? --     Most recent vital signs: Vitals:   11/22/21 1307  BP: 121/72  Pulse: 99  Resp: 19  Temp: 98.9 F (37.2 C)  SpO2: 99%     General: Awake, no distress.  CV:  Good peripheral perfusion.  Resp:  Normal effort.  Abd:    Soft, nontender, gravid appearance Other:     ED Results / Procedures / Treatments   Labs (all labs ordered are listed, but only abnormal results are displayed) Labs Reviewed  LIPASE, BLOOD  COMPREHENSIVE METABOLIC PANEL  CBC  URINALYSIS, ROUTINE W REFLEX MICROSCOPIC     EKG     RADIOLOGY     PROCEDURES:  Critical Care performed:   Procedures   MEDICATIONS ORDERED IN ED: Medications  lactated ringers bolus 1,000 mL (0 mLs Intravenous Stopped 11/22/21 1529)  ondansetron (ZOFRAN) injection 4 mg (4 mg Intravenous Given 11/22/21 1341)  lactated ringers bolus 1,000  mL (1,000 mLs Intravenous New Bag/Given 11/22/21 1531)     IMPRESSION / MDM / ASSESSMENT AND PLAN / ED COURSE  I reviewed the triage vital signs and the nursing notes.    Patient presents with nausea and vomiting in the setting of a history of morning sickness with this pregnancy.  Abdominal exam is benign.  CBC and CMP are reassuring.  Symptoms and presentation are consistent with morning sickness/nausea and vomiting of pregnancy  We will treat with IV fluids, IV Zofran and reevaluate   Patient feeling better after Zofran, is requesting additional bag of fluids which we will provide.  We will refill her Reglan, add Phenergan as needed suppository        FINAL CLINICAL IMPRESSION(S) / ED DIAGNOSES   Final diagnoses:  Morning sickness     Rx / DC Orders   ED Discharge Orders          Ordered    promethazine (PHENERGAN) 12.5 MG suppository  Every 6 hours PRN        11/22/21 1528  Note:  This document was prepared using Dragon voice recognition software and may include unintentional dictation errors.   Lavonia Drafts, MD 11/22/21 [phone]

## 2021-11-23 ENCOUNTER — Ambulatory Visit: Payer: BC Managed Care – PPO | Attending: Obstetrics and Gynecology | Admitting: Physical Therapy

## 2021-11-23 ENCOUNTER — Encounter: Payer: Self-pay | Admitting: Physical Therapy

## 2021-11-23 DIAGNOSIS — R29898 Other symptoms and signs involving the musculoskeletal system: Secondary | ICD-10-CM | POA: Diagnosis not present

## 2021-11-23 DIAGNOSIS — R2689 Other abnormalities of gait and mobility: Secondary | ICD-10-CM | POA: Insufficient documentation

## 2021-11-23 DIAGNOSIS — R102 Pelvic and perineal pain: Secondary | ICD-10-CM | POA: Insufficient documentation

## 2021-11-23 DIAGNOSIS — R278 Other lack of coordination: Secondary | ICD-10-CM | POA: Insufficient documentation

## 2021-11-23 DIAGNOSIS — O26892 Other specified pregnancy related conditions, second trimester: Secondary | ICD-10-CM | POA: Diagnosis not present

## 2021-11-23 NOTE — Patient Instructions (Addendum)
Wear SIJ belt   circulation Elevate legs at the end of the day Heel raises 10 rep x 3.   Deep core LEVEL 1-2 IN SEMI-RECLINED POSITION  Sleep on your sides not your back     ___  Clams shells   Figure-4 stretches  20 reps   ___  Digging elbows and feet while rolling in bed   ____  Seated multidifis twist  Place towel under buttocks to decrease fold in the groin
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
## 2021-11-25 NOTE — Addendum Note (Signed)
Addended by: Mariane Masters on: 11/25/2021 08:24 AM   Modules accepted: Orders

## 2021-12-03 ENCOUNTER — Encounter: Payer: Self-pay | Admitting: Obstetrics and Gynecology

## 2021-12-03 ENCOUNTER — Other Ambulatory Visit: Payer: Self-pay

## 2021-12-03 ENCOUNTER — Ambulatory Visit (INDEPENDENT_AMBULATORY_CARE_PROVIDER_SITE_OTHER): Payer: Medicaid Other | Admitting: Obstetrics and Gynecology

## 2021-12-03 ENCOUNTER — Ambulatory Visit (INDEPENDENT_AMBULATORY_CARE_PROVIDER_SITE_OTHER): Payer: BC Managed Care – PPO

## 2021-12-03 VITALS — BP 125/72 | HR 100 | Wt 161.8 lb

## 2021-12-03 DIAGNOSIS — Z3A18 18 weeks gestation of pregnancy: Secondary | ICD-10-CM

## 2021-12-03 DIAGNOSIS — Z1379 Encounter for other screening for genetic and chromosomal anomalies: Secondary | ICD-10-CM | POA: Diagnosis not present

## 2021-12-03 DIAGNOSIS — Z3481 Encounter for supervision of other normal pregnancy, first trimester: Secondary | ICD-10-CM

## 2021-12-03 DIAGNOSIS — Z3482 Encounter for supervision of other normal pregnancy, second trimester: Secondary | ICD-10-CM

## 2021-12-03 LAB — POCT URINALYSIS DIPSTICK OB
Bilirubin, UA: NEGATIVE
Glucose, UA: NEGATIVE
Ketones, UA: NEGATIVE
Leukocytes, UA: NEGATIVE
Nitrite, UA: NEGATIVE
POC,PROTEIN,UA: NEGATIVE
Spec Grav, UA: 1.02 (ref 1.010–1.025)
Urobilinogen, UA: 0.2 E.U./dL
pH, UA: 6 (ref 5.0–8.0)

## 2021-12-03 NOTE — Progress Notes (Signed)
ROB. Patient states fetal movement with no pain or pressure. She had an ultrasound today and is very happy about that. Patient will have afp labs today. Patient states no questions or concerns at this time.   ?

## 2021-12-03 NOTE — Progress Notes (Signed)
ROB: No complaints today.  Anatomy ultrasound performed-results reviewed with her.  She states her nausea and vomiting has significantly decreased and she feels much better.  aFP today. ?

## 2021-12-05 LAB — AFP, SERUM, OPEN SPINA BIFIDA
AFP MoM: 0.9
AFP Value: 38.6 ng/mL
Gest. Age on Collection Date: 18 weeks
Maternal Age At EDD: 26.8 yr
OSBR Risk 1 IN: 10000
Test Results:: NEGATIVE
Weight: 161 [lb_av]

## 2021-12-07 ENCOUNTER — Ambulatory Visit: Payer: BC Managed Care – PPO | Admitting: Physical Therapy

## 2021-12-09 LAB — DRUG PROFILE, UR, 9 DRUGS (LABCORP)
Amphetamines, Urine: NEGATIVE ng/mL
Barbiturate Quant, Ur: NEGATIVE ng/mL
Benzodiazepine Quant, Ur: NEGATIVE ng/mL
Cannabinoid Quant, Ur: POSITIVE — AB
Cocaine (Metab.): NEGATIVE ng/mL
Methadone Screen, Urine: NEGATIVE ng/mL
Opiate Quant, Ur: NEGATIVE ng/mL
PCP Quant, Ur: NEGATIVE ng/mL
Propoxyphene: NEGATIVE ng/mL

## 2021-12-14 ENCOUNTER — Telehealth: Payer: Self-pay | Admitting: Obstetrics and Gynecology

## 2021-12-14 NOTE — Telephone Encounter (Signed)
Pt called stating that she is [redacted] weeks pregnant she stood up this morning to go to kitchen and she had clear cloudy liquid come out of her- denies any bleeding, states cramping, she has been feeling baby move before today and has not since this morning, I consulted Dr.Cherry who states that pt needs to monitor discharge and movement- she can try drinking something cold and sugary. If any bleeding or increased cramping seek attention at ER.  ?

## 2021-12-21 ENCOUNTER — Ambulatory Visit: Payer: BC Managed Care – PPO | Admitting: Physical Therapy

## 2021-12-30 ENCOUNTER — Encounter: Payer: Self-pay | Admitting: Obstetrics and Gynecology

## 2021-12-30 ENCOUNTER — Ambulatory Visit (INDEPENDENT_AMBULATORY_CARE_PROVIDER_SITE_OTHER): Payer: BC Managed Care – PPO | Admitting: Obstetrics and Gynecology

## 2021-12-30 VITALS — BP 104/47 | HR 95 | Wt 162.9 lb

## 2021-12-30 DIAGNOSIS — O26892 Other specified pregnancy related conditions, second trimester: Secondary | ICD-10-CM

## 2021-12-30 DIAGNOSIS — R102 Pelvic and perineal pain: Secondary | ICD-10-CM

## 2021-12-30 DIAGNOSIS — Z3482 Encounter for supervision of other normal pregnancy, second trimester: Secondary | ICD-10-CM

## 2021-12-30 DIAGNOSIS — Z3A21 21 weeks gestation of pregnancy: Secondary | ICD-10-CM

## 2021-12-30 NOTE — Patient Instructions (Signed)

## 2021-12-30 NOTE — Progress Notes (Signed)
ROB: She is doing well today, no new concerns. 

## 2021-12-30 NOTE — Progress Notes (Signed)
ROB: Doing well, no complaints.  Is following with PT for h/o chronic pelvic pain.  Normal anatomy scan. RTC in 4 weeks . ?

## 2022-01-07 ENCOUNTER — Ambulatory Visit: Payer: BC Managed Care – PPO | Attending: Obstetrics and Gynecology | Admitting: Physical Therapy

## 2022-01-10 ENCOUNTER — Telehealth: Payer: Self-pay | Admitting: Physical Therapy

## 2022-01-10 NOTE — Telephone Encounter (Signed)
Left message with patient to follow up with no show last Friday after being left 2 VM to call and confirm.  ? ?Therapist also asked if pt has heard back from [facility] which pt was emailed their contact and the [employer] coordinator was provided pt's info.  Pt was referred for Doula services to get additional support during her pregnancy.  ?

## 2022-01-14 ENCOUNTER — Telehealth: Payer: Self-pay | Admitting: Physical Therapy

## 2022-01-14 ENCOUNTER — Ambulatory Visit: Payer: BC Managed Care – PPO | Admitting: Physical Therapy

## 2022-01-14 NOTE — Telephone Encounter (Signed)
Therapist LVM with pt about her no show for today's 10 am and wanted to see if she needs more appts and whether or not she heard back from the [facility].   ?

## 2022-01-27 ENCOUNTER — Encounter: Payer: BC Managed Care – PPO | Admitting: Obstetrics and Gynecology

## 2022-01-27 DIAGNOSIS — Z3482 Encounter for supervision of other normal pregnancy, second trimester: Secondary | ICD-10-CM

## 2022-01-27 DIAGNOSIS — Z3A25 25 weeks gestation of pregnancy: Secondary | ICD-10-CM

## 2022-02-03 DIAGNOSIS — Z369 Encounter for antenatal screening, unspecified: Secondary | ICD-10-CM | POA: Diagnosis not present

## 2022-02-03 DIAGNOSIS — N926 Irregular menstruation, unspecified: Secondary | ICD-10-CM | POA: Diagnosis not present

## 2022-02-15 DIAGNOSIS — O99810 Abnormal glucose complicating pregnancy: Secondary | ICD-10-CM | POA: Diagnosis not present

## 2022-02-22 DIAGNOSIS — R000 Tachycardia, unspecified: Secondary | ICD-10-CM | POA: Diagnosis not present

## 2022-04-07 DIAGNOSIS — Z3A35 35 weeks gestation of pregnancy: Secondary | ICD-10-CM | POA: Diagnosis not present

## 2022-04-07 DIAGNOSIS — F418 Other specified anxiety disorders: Secondary | ICD-10-CM | POA: Diagnosis not present

## 2022-04-07 DIAGNOSIS — Z3403 Encounter for supervision of normal first pregnancy, third trimester: Secondary | ICD-10-CM | POA: Diagnosis not present

## 2022-04-07 DIAGNOSIS — G4700 Insomnia, unspecified: Secondary | ICD-10-CM | POA: Diagnosis not present

## 2022-04-07 DIAGNOSIS — Z113 Encounter for screening for infections with a predominantly sexual mode of transmission: Secondary | ICD-10-CM | POA: Diagnosis not present

## 2022-04-07 LAB — OB RESULTS CONSOLE GBS: GBS: NEGATIVE

## 2022-04-15 ENCOUNTER — Other Ambulatory Visit: Payer: Self-pay | Admitting: Obstetrics & Gynecology

## 2022-04-19 ENCOUNTER — Telehealth (HOSPITAL_COMMUNITY): Payer: Self-pay | Admitting: *Deleted

## 2022-04-27 ENCOUNTER — Inpatient Hospital Stay (HOSPITAL_COMMUNITY)
Admission: AD | Admit: 2022-04-27 | Discharge: 2022-04-27 | Disposition: A | Discharge status: Home / Self Care | Payer: BC Managed Care – PPO | Attending: Obstetrics & Gynecology | Admitting: Obstetrics & Gynecology

## 2022-04-27 ENCOUNTER — Telehealth (HOSPITAL_COMMUNITY): Payer: Self-pay | Admitting: *Deleted

## 2022-04-27 ENCOUNTER — Encounter (HOSPITAL_COMMUNITY): Payer: Self-pay | Admitting: *Deleted

## 2022-04-27 ENCOUNTER — Encounter (HOSPITAL_COMMUNITY): Payer: Self-pay | Admitting: Obstetrics & Gynecology

## 2022-04-27 DIAGNOSIS — O471 False labor at or after 37 completed weeks of gestation: Secondary | ICD-10-CM | POA: Diagnosis not present

## 2022-04-27 DIAGNOSIS — Z3689 Encounter for other specified antenatal screening: Secondary | ICD-10-CM

## 2022-04-27 DIAGNOSIS — O368130 Decreased fetal movements, third trimester, not applicable or unspecified: Secondary | ICD-10-CM | POA: Diagnosis not present

## 2022-04-27 DIAGNOSIS — Z3A38 38 weeks gestation of pregnancy: Secondary | ICD-10-CM | POA: Insufficient documentation

## 2022-04-27 NOTE — Telephone Encounter (Signed)
Preadmission screen  

## 2022-04-27 NOTE — MAU Note (Signed)
.  Veronica Cooke is a 26 y.o. at [redacted]w[redacted]d here in MAU reporting: ctx on and off since last night. Stronger and longer today. Stated they last about 3 min. Stated fetal movement has been less than usual. Denies any vag bleeding or leaking at this time.   Onset of complaint: last night Pain score: 7 Vitals:   04/27/22 1739  BP: 110/68  Pulse: 85  Resp: 18  Temp: 98 F (36.7 C)     FHT:150 Lab orders placed from triage:  labor eval

## 2022-04-27 NOTE — Discharge Instructions (Signed)
2/3-1-1 Rule Go to MAU for painful contractions every 2-3 minutes, lasting 1 minute each for 1.5 hours.  

## 2022-04-27 NOTE — MAU Provider Note (Signed)
History     CSN: [id number]  Arrival date and time: 04/27/22 1720   Event Date/Time   First Provider Initiated Contact with Patient 04/27/22 1815      Chief Complaint  Patient presents with   Labor Eval   [name] is a 26 y.o. year old G95P2002 female at [redacted]w[redacted]d weeks gestation who presents to MAU reporting for a labor evaluation and DFM. She reports having intermittent UCs since last night. She reports the UCs are stronger today; rated 7/10. She states "I have not been able to feel the baby move that much today." She states, "He is moving a whole lot since I have been on the monitor here." She was seen at [facility] and was told to come to MAU for evaluation of labor and DFM. Her spouse is present and contributing to the history taking.     OB History     Gravida  3   Para  2   Term  2   Preterm      AB      Living  2      SAB      IAB      Ectopic      Multiple  0   Live Births  2           Past Medical History:  Diagnosis Date   Anemia    Anxiety    Endometriosis    Headache     Past Surgical History:  Procedure Laterality Date   CHOLECYSTECTOMY N/A 05/21/2019   Procedure: LAPAROSCOPIC CHOLECYSTECTOMY WITH INTRAOPERATIVE CHOLANGIOGRAM;  Surgeon: Chevis Pretty III, MD;  Location: [facility];  Service: General;  Laterality: N/A;   DIAGNOSTIC LAPAROSCOPY  04/2021   to rule out ovarian cyst/torsion,   NO PAST SURGERIES     WISDOM TOOTH EXTRACTION Bilateral 2017    Family History  Problem Relation Age of Onset   Gallbladder disease Mother    Cystinosis Mother    Healthy Father    Cancer Maternal Grandmother        Breast/ Ovarian   Stroke Maternal Grandfather    Diabetes Maternal Grandfather    Diabetes Maternal Aunt    Breast cancer Maternal Aunt 35    Social History   Tobacco Use   Smoking status: Never   Smokeless tobacco: Never  Vaping Use   Vaping Use: Some days  Substance Use Topics   Alcohol use: No   Drug use:  No    Allergies: No Known Allergies  Medications Prior to Admission  Medication Sig Dispense Refill Last Dose   Doxylamine-Pyridoxine (DICLEGIS PO) Take by mouth.      famotidine (PEPCID) 20 MG tablet Take 1 tablet (20 mg total) by mouth 2 (two) times daily. 60 tablet 0    promethazine (PHENERGAN) 12.5 MG suppository Place 1 suppository (12.5 mg total) rectally every 6 (six) hours as needed for nausea or vomiting. 12 each 0     Review of Systems  Constitutional: Negative.   HENT: Negative.    Eyes: Negative.   Respiratory: Negative.    Cardiovascular: Negative.   Gastrointestinal: Negative.   Endocrine: Negative.   Genitourinary:  Positive for pelvic pain (intermittent UCs).       DFM prior to arrival to MAU  Musculoskeletal: Negative.   Skin: Negative.   Allergic/Immunologic: Negative.   Neurological: Negative.   Hematological: Negative.   Psychiatric/Behavioral: Negative.     Physical Exam   Blood pressure 110/68,  pulse 85, temperature 98 F (36.7 C), resp. rate 18, height 5\' 4"  (1.626 m), weight 88.9 kg, last menstrual period 07/02/2021.  Physical Exam Vitals and nursing note reviewed.  Constitutional:      Appearance: Normal appearance. She is normal weight.  Cardiovascular:     Rate and Rhythm: Normal rate.  Pulmonary:     Effort: Pulmonary effort is normal.  Abdominal:     Palpations: Abdomen is soft.  Musculoskeletal:        General: Normal range of motion.  Skin:    General: Skin is warm and dry.  Neurological:     Mental Status: She is alert and oriented to person, place, and time.  Psychiatric:        Mood and Affect: Mood normal.        Behavior: Behavior normal.        Thought Content: Thought content normal.        Judgment: Judgment normal.    Reassessment @ 1910 (by RN): cervix unchanged MAU Course  Procedures  MDM CEFM SVE by RN: Dilation: 2 Effacement (%): 40 Exam by:: [email or web address], RN   Assessment and Plan  False labor after 37  completed weeks of gestation  - Information provided on 2/3-1-1 Rule: Go to MAU for painful contractions every 2-3 minutes, lasting 1 minute each for 1.5 hours.  NST (non-stress test) reactive - Reassurance given that fetal well-being is normal and movements are detected and heard on the monitor  Patient perceives good (+) FM also  [redacted] weeks gestation of pregnancy   - Discharge patient - Keep scheduled appt with [facility] on 04/28/2022 - Patient verbalized an understanding of the plan of care and agrees.    06/28/2022, CNM 04/27/2022, 6:34 PM

## 2022-04-30 ENCOUNTER — Inpatient Hospital Stay (HOSPITAL_COMMUNITY): Payer: BC Managed Care – PPO

## 2022-05-02 ENCOUNTER — Encounter (HOSPITAL_COMMUNITY): Payer: Self-pay | Admitting: Obstetrics & Gynecology

## 2022-05-02 ENCOUNTER — Inpatient Hospital Stay (HOSPITAL_COMMUNITY)
Admission: RE | Admit: 2022-05-02 | Discharge: 2022-05-02 | Disposition: A | Discharge status: Home / Self Care | Payer: BC Managed Care – PPO | Source: Ambulatory Visit

## 2022-05-02 ENCOUNTER — Inpatient Hospital Stay (HOSPITAL_COMMUNITY): Payer: BC Managed Care – PPO | Admitting: Anesthesiology

## 2022-05-02 ENCOUNTER — Inpatient Hospital Stay (HOSPITAL_COMMUNITY)
Admission: AD | Admit: 2022-05-02 | Discharge: 2022-05-04 | DRG: 807 | Disposition: A | Discharge status: Home / Self Care | Payer: BC Managed Care – PPO | Attending: Obstetrics & Gynecology | Admitting: Obstetrics & Gynecology

## 2022-05-02 ENCOUNTER — Other Ambulatory Visit: Payer: Self-pay

## 2022-05-02 DIAGNOSIS — Z3A39 39 weeks gestation of pregnancy: Secondary | ICD-10-CM | POA: Diagnosis not present

## 2022-05-02 DIAGNOSIS — O9902 Anemia complicating childbirth: Secondary | ICD-10-CM | POA: Diagnosis not present

## 2022-05-02 DIAGNOSIS — Z3490 Encounter for supervision of normal pregnancy, unspecified, unspecified trimester: Principal | ICD-10-CM | POA: Diagnosis present

## 2022-05-02 DIAGNOSIS — D649 Anemia, unspecified: Secondary | ICD-10-CM | POA: Diagnosis not present

## 2022-05-02 DIAGNOSIS — O26893 Other specified pregnancy related conditions, third trimester: Secondary | ICD-10-CM | POA: Diagnosis not present

## 2022-05-02 LAB — RPR: RPR Ser Ql: NONREACTIVE

## 2022-05-02 LAB — CBC
HCT: 32.2 % — ABNORMAL LOW (ref 36.0–46.0)
Hemoglobin: 10.1 g/dL — ABNORMAL LOW (ref 12.0–15.0)
MCH: 25.3 pg — ABNORMAL LOW (ref 26.0–34.0)
MCHC: 31.4 g/dL (ref 30.0–36.0)
MCV: 80.7 fL (ref 80.0–100.0)
Platelets: 328 10*3/uL (ref 150–400)
RBC: 3.99 MIL/uL (ref 3.87–5.11)
RDW: 13.7 % (ref 11.5–15.5)
WBC: 11 10*3/uL — ABNORMAL HIGH (ref 4.0–10.5)
nRBC: 0 % (ref 0.0–0.2)

## 2022-05-02 LAB — TYPE AND SCREEN
ABO/RH(D): O POS
Antibody Screen: NEGATIVE

## 2022-05-02 MED ORDER — DIBUCAINE (PERIANAL) 1 % EX OINT
1.0000 | TOPICAL_OINTMENT | Freq: Four times a day (QID) | CUTANEOUS | Status: DC
  Administered 2022-05-03: 1 via RECTAL
  Filled 2022-05-02: qty 28

## 2022-05-02 MED ORDER — LACTATED RINGERS IV SOLN
500.0000 mL | Freq: Once | INTRAVENOUS | Status: AC
  Administered 2022-05-02: 500 mL via INTRAVENOUS

## 2022-05-02 MED ORDER — LACTATED RINGERS IV SOLN: INTRAVENOUS | Status: DC

## 2022-05-02 MED ORDER — SIMETHICONE 80 MG PO CHEW: 80.0000 mg | CHEWABLE_TABLET | ORAL | Status: DC | PRN

## 2022-05-02 MED ORDER — EPHEDRINE 5 MG/ML INJ: 10.0000 mg | INTRAVENOUS | Status: DC | PRN

## 2022-05-02 MED ORDER — FENTANYL-BUPIVACAINE-NACL 0.5-0.125-0.9 MG/250ML-% EP SOLN
12.0000 mL/h | EPIDURAL | Status: DC | PRN
  Administered 2022-05-02: 12 mL/h via EPIDURAL
  Filled 2022-05-02: qty 250

## 2022-05-02 MED ORDER — ONDANSETRON HCL 4 MG PO TABS: 4.0000 mg | ORAL_TABLET | ORAL | Status: DC | PRN

## 2022-05-02 MED ORDER — TRANEXAMIC ACID-NACL 1000-0.7 MG/100ML-% IV SOLN
1000.0000 mg | Freq: Once | INTRAVENOUS | Status: AC
Start: 2022-05-02 — End: 2022-05-02

## 2022-05-02 MED ORDER — OXYTOCIN-SODIUM CHLORIDE 30-0.9 UT/500ML-% IV SOLN: 2.5000 [IU]/h | INTRAVENOUS | Status: DC

## 2022-05-02 MED ORDER — WITCH HAZEL-GLYCERIN EX PADS
1.0000 | MEDICATED_PAD | Freq: Four times a day (QID) | CUTANEOUS | Status: DC
  Administered 2022-05-03: 1 via TOPICAL

## 2022-05-02 MED ORDER — ONDANSETRON HCL 4 MG/2ML IJ SOLN: 4.0000 mg | INTRAMUSCULAR | Status: DC | PRN

## 2022-05-02 MED ORDER — ACETAMINOPHEN 325 MG PO TABS
650.0000 mg | ORAL_TABLET | ORAL | Status: DC | PRN
  Administered 2022-05-03 – 2022-05-04 (×2): 650 mg via ORAL
  Filled 2022-05-02 (×2): qty 2

## 2022-05-02 MED ORDER — BENZOCAINE-MENTHOL 20-0.5 % EX AERO
1.0000 | INHALATION_SPRAY | CUTANEOUS | Status: DC | PRN
  Administered 2022-05-03: 1 via TOPICAL
  Filled 2022-05-02: qty 56

## 2022-05-02 MED ORDER — IBUPROFEN 600 MG PO TABS
600.0000 mg | ORAL_TABLET | Freq: Four times a day (QID) | ORAL | Status: DC
  Administered 2022-05-03 – 2022-05-04 (×6): 600 mg via ORAL
  Filled 2022-05-02 (×6): qty 1

## 2022-05-02 MED ORDER — TETANUS-DIPHTH-ACELL PERTUSSIS 5-2.5-18.5 LF-MCG/0.5 IM SUSY: 0.5000 mL | PREFILLED_SYRINGE | Freq: Once | INTRAMUSCULAR | Status: DC

## 2022-05-02 MED ORDER — DIPHENHYDRAMINE HCL 25 MG PO CAPS: 25.0000 mg | ORAL_CAPSULE | Freq: Four times a day (QID) | ORAL | Status: DC | PRN

## 2022-05-02 MED ORDER — PRENATAL MULTIVITAMIN CH
1.0000 | ORAL_TABLET | Freq: Every day | ORAL | Status: DC
  Administered 2022-05-03 – 2022-05-04 (×2): 1 via ORAL
  Filled 2022-05-02 (×2): qty 1

## 2022-05-02 MED ORDER — LIDOCAINE HCL (PF) 1 % IJ SOLN: 30.0000 mL | INTRAMUSCULAR | Status: DC | PRN

## 2022-05-02 MED ORDER — COCONUT OIL OIL: 1.0000 | TOPICAL_OIL | Status: DC | PRN

## 2022-05-02 MED ORDER — SOD CITRATE-CITRIC ACID 500-334 MG/5ML PO SOLN: 30.0000 mL | ORAL | Status: DC | PRN

## 2022-05-02 MED ORDER — PHENYLEPHRINE 80 MCG/ML (10ML) SYRINGE FOR IV PUSH (FOR BLOOD PRESSURE SUPPORT): 80.0000 ug | PREFILLED_SYRINGE | INTRAVENOUS | Status: DC | PRN

## 2022-05-02 MED ORDER — SENNOSIDES-DOCUSATE SODIUM 8.6-50 MG PO TABS
2.0000 | ORAL_TABLET | ORAL | Status: DC
  Administered 2022-05-03 – 2022-05-04 (×2): 2 via ORAL
  Filled 2022-05-02 (×2): qty 2

## 2022-05-02 MED ORDER — LACTATED RINGERS IV SOLN: 500.0000 mL | INTRAVENOUS | Status: DC | PRN

## 2022-05-02 MED ORDER — PHENYLEPHRINE 80 MCG/ML (10ML) SYRINGE FOR IV PUSH (FOR BLOOD PRESSURE SUPPORT)
80.0000 ug | PREFILLED_SYRINGE | INTRAVENOUS | Status: DC | PRN
  Administered 2022-05-02: 80 ug via INTRAVENOUS
  Filled 2022-05-02: qty 10

## 2022-05-02 MED ORDER — OXYTOCIN-SODIUM CHLORIDE 30-0.9 UT/500ML-% IV SOLN
1.0000 m[IU]/min | INTRAVENOUS | Status: DC
  Administered 2022-05-02: 22 m[IU]/min via INTRAVENOUS
  Administered 2022-05-02: 2 m[IU]/min via INTRAVENOUS
  Administered 2022-05-02: 18 m[IU]/min via INTRAVENOUS
  Administered 2022-05-02: 20 m[IU]/min via INTRAVENOUS
  Filled 2022-05-02 (×2): qty 500

## 2022-05-02 MED ORDER — ZOLPIDEM TARTRATE 5 MG PO TABS
5.0000 mg | ORAL_TABLET | Freq: Every evening | ORAL | Status: DC | PRN
  Administered 2022-05-03: 5 mg via ORAL
  Filled 2022-05-02: qty 1

## 2022-05-02 MED ORDER — OXYTOCIN BOLUS FROM INFUSION
333.0000 mL | Freq: Once | INTRAVENOUS | Status: AC
  Administered 2022-05-02: 333 mL via INTRAVENOUS

## 2022-05-02 MED ORDER — TRANEXAMIC ACID-NACL 1000-0.7 MG/100ML-% IV SOLN
INTRAVENOUS | Status: AC
  Administered 2022-05-02: 1000 mg via INTRAVENOUS
  Filled 2022-05-02: qty 100

## 2022-05-02 MED ORDER — ONDANSETRON HCL 4 MG/2ML IJ SOLN
4.0000 mg | Freq: Four times a day (QID) | INTRAMUSCULAR | Status: DC | PRN
  Administered 2022-05-02: 4 mg via INTRAVENOUS
  Filled 2022-05-02: qty 2

## 2022-05-02 MED ORDER — DIPHENHYDRAMINE HCL 50 MG/ML IJ SOLN: 12.5000 mg | INTRAMUSCULAR | Status: DC | PRN

## 2022-05-02 MED ORDER — LIDOCAINE HCL (PF) 1 % IJ SOLN
INTRAMUSCULAR | Status: DC | PRN
  Administered 2022-05-02 (×2): 5 mL via EPIDURAL

## 2022-05-02 MED ORDER — ACETAMINOPHEN 325 MG PO TABS
650.0000 mg | ORAL_TABLET | ORAL | Status: DC | PRN
  Administered 2022-05-02: 650 mg via ORAL
  Filled 2022-05-02: qty 2

## 2022-05-02 MED ORDER — TERBUTALINE SULFATE 1 MG/ML IJ SOLN: 0.2500 mg | Freq: Once | INTRAMUSCULAR | Status: DC | PRN

## 2022-05-02 MED ORDER — METHYLERGONOVINE MALEATE 0.2 MG PO TABS: 0.2000 mg | ORAL_TABLET | ORAL | Status: DC | PRN

## 2022-05-02 NOTE — Progress Notes (Signed)
MD LABOR PROGRESS NOTE  Veronica Cooke is a 26 y.o. G3P2002 at [redacted]w[redacted]d  admitted for induction of labor due to Elective at term.  Subjective:  Patient comfortable with epidural   Objective:  BP (!) 105/57   Pulse 84   Temp 98.3 F (36.8 C) (Oral)   Resp 18   Ht 5\' 4"  (1.626 m)   Wt 91.2 kg   LMP 07/02/2021 (Approximate)   BMI 34.52 kg/m   FHT:  FHR: 145 bpm, variability: moderate,  accelerations:  Present,  decelerations:  Present one late deceleration nadir 90's lasting 30 seconds with return to baseline UC:   regular, every 4 minutes SVE:   Dilation: 4 Effacement (%): 60 Station: -2 Exam by:: Dr. [email or web address] AROM: performed clear fluid  Pitocin @ 14 mu/min  Labs: Lab Results  Component Value Date   WBC 11.0 (H) 05/02/2022   HGB 10.1 (L) 05/02/2022   HCT 32.2 (L) 05/02/2022   MCV 80.7 05/02/2022   PLT 328 05/02/2022    Assessment / Plan: 26 y.o. G3P2002 [redacted]w[redacted]d still in early labor Induction of labor due to term with favorable cervix,  progressing well on pitocin  Labor: Progressing normally Fetal Wellbeing:  Category II Pain Control:  Epidural Anticipated MOD:  NSVD  Expectant management   [redacted]w[redacted]d, MD  05/02/2022 1:37 PM

## 2022-05-02 NOTE — Anesthesia Procedure Notes (Signed)
Epidural Patient location during procedure: OB Start time: 05/02/2022 11:02 AM End time: 05/02/2022 11:09 AM  Staffing Anesthesiologist: Mal Amabile, MD  Preanesthetic Checklist Completed: patient identified, IV checked, site marked, risks and benefits discussed, surgical consent, monitors and equipment checked, pre-op evaluation and timeout performed  Epidural Patient position: sitting Prep: DuraPrep and site prepped and draped Patient monitoring: continuous pulse ox and blood pressure Approach: midline Location: L3-L4 Injection technique: LOR air  Needle:  Needle type: Tuohy  Needle gauge: 17 G Needle length: 9 cm and 9 Needle insertion depth: 4 cm Catheter type: closed end flexible Catheter size: 19 Gauge Catheter at skin depth: 9 cm Test dose: negative and Other  Assessment Events: blood not aspirated, injection not painful, no injection resistance, no paresthesia and negative IV test  Additional Notes Patient identified. Risks and benefits discussed including failed block, incomplete  Pain control, post dural puncture headache, nerve damage, paralysis, blood pressure Changes, nausea, vomiting, reactions to medications-both toxic and allergic and post Partum back pain. All questions were answered. Patient expressed understanding and wished to proceed. Sterile technique was used throughout procedure. Epidural site was Dressed with sterile barrier dressing. No paresthesias, signs of intravascular injection Or signs of intrathecal spread were encountered.  Patient was more comfortable after the epidural was dosed. Please see RN's note for documentation of vital signs and FHR which are stable. Reason for block:procedure for pain

## 2022-05-02 NOTE — Progress Notes (Signed)
MD Labor note - remote  Veronica Cooke is a 26 y.o. G3P2002 at [redacted]w[redacted]d by ultrasound admitted for induction of labor due to Elective at term.  Subjective: RN reported that patient c/o right lower side pain.   Objective: BP (!) 106/56   Pulse 77   Temp 97.9 F (36.6 C) (Oral)   Resp 16   Ht 5\' 4"  (1.626 m)   Wt 91.2 kg   LMP 07/02/2021 (Approximate)   SpO2 100%   BMI 34.52 kg/m  No intake/output data recorded. Total I/O In: -  Out: 900 [Urine:900]  FHT:  FHR: 145 bpm, variability: moderate,  accelerations:  Present,  decelerations:  Absent UC:   regular, every 3 minutes SVE:   Dilation: 5.5 Effacement (%): 80 Station: -2 Exam by:: M. Aube RN  Pitocin: 20 mU   Assessment / Plan: Induction of labor due to elective at term,  progressing well on pitocin Now transitioning into active labor  Labor: Progressing normally Preeclampsia:  no signs or symptoms of toxicity Fetal Wellbeing:  Category I Pain Control:  Epidural I/D:  n/a Anticipated MOD:  NSVD  M, MD 05/02/2022, 6:54 PM

## 2022-05-02 NOTE — Anesthesia Preprocedure Evaluation (Signed)
Anesthesia Evaluation  Patient identified by MRN, date of birth, ID band Patient awake    Reviewed: Allergy & Precautions, Patient's Chart, lab work & pertinent test results  Airway Mallampati: II  TM Distance: >3 FB     Dental no notable dental hx.    Pulmonary neg pulmonary ROS,    Pulmonary exam normal        Cardiovascular Normal cardiovascular exam     Neuro/Psych  Headaches, Anxiety    GI/Hepatic Neg liver ROS, GERD  Medicated,  Endo/Other  Obesity  Renal/GU negative Renal ROS  negative genitourinary   Musculoskeletal negative musculoskeletal ROS (+)   Abdominal (+) + obese,   Peds  Hematology  (+) Blood dyscrasia, anemia ,   Anesthesia Other Findings   Reproductive/Obstetrics (+) Pregnancy                             Anesthesia Physical Anesthesia Plan  ASA: 2  Anesthesia Plan: Epidural   Post-op Pain Management:    Induction:   PONV Risk Score and Plan:   Airway Management Planned: Natural Airway  Additional Equipment: None  Intra-op Plan:   Post-operative Plan:   Informed Consent: I have reviewed the patients History and Physical, chart, labs and discussed the procedure including the risks, benefits and alternatives for the proposed anesthesia with the patient or authorized representative who has indicated his/her understanding and acceptance.       Plan Discussed with: Anesthesiologist  Anesthesia Plan Comments:         Anesthesia Quick Evaluation
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
## 2022-05-03 ENCOUNTER — Encounter (HOSPITAL_COMMUNITY): Payer: Self-pay | Admitting: Obstetrics & Gynecology

## 2022-05-03 LAB — CBC
HCT: 25.8 % — ABNORMAL LOW (ref 36.0–46.0)
Hemoglobin: 8.1 g/dL — ABNORMAL LOW (ref 12.0–15.0)
MCH: 25.3 pg — ABNORMAL LOW (ref 26.0–34.0)
MCHC: 31.4 g/dL (ref 30.0–36.0)
MCV: 80.6 fL (ref 80.0–100.0)
Platelets: 255 10*3/uL (ref 150–400)
RBC: 3.2 MIL/uL — ABNORMAL LOW (ref 3.87–5.11)
RDW: 13.7 % (ref 11.5–15.5)
WBC: 12.6 10*3/uL — ABNORMAL HIGH (ref 4.0–10.5)
nRBC: 0 % (ref 0.0–0.2)

## 2022-05-03 NOTE — Social Work (Addendum)
CSW made copies of Adoption Agency representative  ID and placed in the infant's chart. CSW informed the Charge RN that the Adoption paper is being completed at this time. The Adoption Agency representative Alfred Levins will provide RN with necessary documents Relinquishment of Minor for Adoption by Parent or Guardian and Acceptance of Relinquishment of for Adoption by Parent or Guardian. The RN instructed to place document  in infants chart. The RN stated she will then assign a room to the adoptive parents.  Vivi Barrack, MSW, LCSW [facility]  Clinical Social Worker  [phone] 05/03/2022  5:18 PM

## 2022-05-03 NOTE — Progress Notes (Signed)
MD PROGRESS NOTE  Veronica Cooke [id number]  PPD# 1 s/p SVD Subjective:   Patient doing well and w/o complaints.  She is tolerating PO fluid and solids w/o nausea or vomiting Bleeding is moderate Up ad lib / ambulatory / voiding w/o difficulty  Objective:   VS: BP 107/72 (BP Location: Right Arm)   Pulse 81   Temp 97.8 F (36.6 C) (Oral)   Resp 16   Ht 5\' 4"  (1.626 m)   Wt 91.2 kg   LMP 07/02/2021 (Approximate)   SpO2 98%   Breastfeeding Unknown   BMI 34.52 kg/m   Physical Exam: Alert and oriented X3 Lungs: Clear and unlabored Heart: regular rate and rhythm / no mumurs Abdomen: soft, non-tender, non-distended  Fundus: firm, non-tender, U-1 Perineum: not examined Lochia: normal Extremities: no edema, no calf pain, tenderness, or cords  Labs:  Recent Labs    05/02/22 0630 05/03/22 0525  WBC 11.0* 12.6*  HGB 10.1* 8.1*  PLT 328 255   Blood type: --/--/O POS (08/07 0630) Rubella: 2.09 (01/10 1116)                     Assessment:  26 year old G3P3 PPD # 1 s/p NSVD doing well   Plan:  Continue routine post partum care Circumcision of baby boy pending Peds exam and consent  Anticipate D/C tomorrow    30, MD 05/03/2022, 11:15 AM

## 2022-05-03 NOTE — Anesthesia Postprocedure Evaluation (Signed)
Anesthesia Post Note  Patient: Amanda Steuart  Procedure(s) Performed: AN AD HOC LABOR EPIDURAL     Patient location during evaluation: Mother Baby Anesthesia Type: Epidural Level of consciousness: awake and alert Pain management: pain level controlled Vital Signs Assessment: post-procedure vital signs reviewed and stable Respiratory status: spontaneous breathing, nonlabored ventilation and respiratory function stable Cardiovascular status: stable Postop Assessment: no headache, no backache and epidural receding Anesthetic complications: no   No notable events documented.  Last Vitals:  Vitals:   05/03/22 0040 05/03/22 0458  BP: 101/64 (!) 95/53  Pulse: 66 69  Resp: 18 18  Temp: 37 C 36.6 C  SpO2: 97% 97%    Last Pain:  Vitals:   05/03/22 0458  TempSrc: Oral  PainSc: 5    Pain Goal:                   Ellisa Devivo
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
## 2022-05-03 NOTE — Lactation Note (Signed)
This note was copied from a baby's chart. Lactation Consultation Note  Patient Name: Veronica Cooke [id number] Date: 05/03/2022   Age:27 hours Formula feeding.  Maternal Data    Feeding Nipple Type: Slow - flow  LATCH Score                    Lactation Tools Discussed/Used    Interventions    Discharge    Consult Status Consult Status: Complete    Chisom Aust G 05/03/2022, 4:09 AM

## 2022-05-04 ENCOUNTER — Other Ambulatory Visit (HOSPITAL_COMMUNITY): Payer: Self-pay

## 2022-05-04 MED ORDER — ACETAMINOPHEN 500 MG PO TABS
500.0000 mg | ORAL_TABLET | Freq: Four times a day (QID) | ORAL | 3 refills | Status: AC | PRN
  Filled 2022-05-04: qty 30, 8d supply, fill #0

## 2022-05-04 MED ORDER — BENZOCAINE-MENTHOL 20-0.5 % EX AERO
1.0000 | INHALATION_SPRAY | CUTANEOUS | 3 refills | Status: AC | PRN
  Filled 2022-05-04: qty 56, fill #0

## 2022-05-04 MED ORDER — OXYCODONE-ACETAMINOPHEN 5-325 MG PO TABS
1.0000 | ORAL_TABLET | ORAL | 0 refills | Status: DC | PRN
  Filled 2022-05-04: qty 20, 2d supply, fill #0

## 2022-05-04 MED ORDER — PRENATAL MULTIVITAMIN CH
1.0000 | ORAL_TABLET | Freq: Every day | ORAL | 3 refills | Status: DC
  Filled 2022-05-04: qty 30, 30d supply, fill #0

## 2022-05-04 MED ORDER — SENNOSIDES-DOCUSATE SODIUM 8.6-50 MG PO TABS
2.0000 | ORAL_TABLET | Freq: Every evening | ORAL | 3 refills | Status: AC | PRN
  Filled 2022-05-04: qty 30, 15d supply, fill #0

## 2022-05-04 MED ORDER — IBUPROFEN 600 MG PO TABS
600.0000 mg | ORAL_TABLET | Freq: Four times a day (QID) | ORAL | 3 refills | Status: DC | PRN
  Filled 2022-05-04: qty 60, 15d supply, fill #0

## 2022-05-04 NOTE — Progress Notes (Signed)
Post Partum Day 2 Subjective: no complaints, up ad lib, voiding, and tolerating PO  Objective: Blood pressure 107/69, pulse 83, temperature 98.1 F (36.7 C), temperature source Oral, resp. rate 18, height 5\' 4"  (1.626 m), weight 91.2 kg, last menstrual period 07/02/2021, SpO2 98 %, unknown if currently breastfeeding.  Physical Exam:  General: alert, cooperative, and no distress Lochia: appropriate Uterine Fundus: firm U-1 Perineum: not examined DVT Evaluation: No evidence of DVT seen on physical exam. No cords or calf tenderness. No significant calf/ankle edema.  Recent Labs    05/02/22 0630 05/03/22 0525  HGB 10.1* 8.1*  HCT 32.2* 25.8*    Assessment/Plan: 26 yo PPD#2 s/p SVD baby boy - in room with patient. Being adopted.  Circumcision today Will discharge patient home for close f/u in office in 1-2 weeks to check on mood.  Patient with known pelvic floor relaxation - unsure now about hysterectomy, may want to have        Another child.  Discussed briefly other options, i.e. pelvic floor PT, pessary, etc.     LOS: 2 days   30, MD 05/04/2022, 10:56 AM

## 2022-05-04 NOTE — Social Work (Signed)
The adoption paperwork was completed with the attorney (Angel Gillis) and biological parents. The adoption agency present during the process. The infant will discharge to the adoptive parents and not the agency. All documents were placed in the infant's chart.   Veronica Cooke, MSW, LCSW [facility]  Clinical Social Worker  [phone] 05/04/2022  10:01 AM    

## 2022-05-04 NOTE — Discharge Summary (Signed)
[facility] Discharge Summary   Patient Name:   Veronica Cooke DOB:     26-Dec-1995 MRN:     [id number]  Date of Admission:   05/02/2022 Date of Discharge:  05/04/2022  Admitting diagnosis:    Encounter for elective induction of labor [Z34.90] Principal Problem:   Encounter for elective induction of labor     Discharge diagnosis:    Encounter for elective induction of labor [Z34.90] Principal Problem:   Encounter for elective induction of labor   Additional problems: none                                          Post partum procedures: none Augmentation: AROM and Pitocin Complications: None  Hospital course: Induction of Labor With Vaginal Delivery   [name] at 79w3dwas admitted to the hospital 05/02/2022 for induction of labor.  Indication for induction: Favorable cervix at term.  Patient had an uncomplicated labor course as follows: Membrane Rupture Time/Date: 1:20 PM ,05/02/2022   Delivery Method:Vaginal, Spontaneous  Episiotomy: None  Lacerations:  None  Details of delivery can be found in separate delivery note.  Patient had a routine postpartum course. Patient is discharged home 05/04/22.  Newborn Data: Birth date:05/02/2022  Birth time:8:44 PM  Gender:Female  Living status:Living  Apgars:8 ,9  Weight:3685 g   Magnesium Sulfate received: No BMZ received: No Rhophylac:N/A MMR:No T-DaP:Given postpartum Flu: No Transfusion:No                                                               Type of Delivery:  NSVD Delivering Provider: GArrie Cooke Date of Delivery:  05/02/22  Newborn Data:  Baby Feeding:   Bottle Disposition:   Home with adoptive parents  Physical Exam:   Vitals:   05/03/22 1000 05/03/22 1335 05/03/22 2120 05/04/22 0538  BP: 107/72 112/80 116/65 107/69  Pulse: 81 80 76 83  Resp: '16 16 18 18  ' Temp: 97.8 F (36.6 C) 98.5 F (36.9 C) 98.4 F (36.9 C) 98.1 F (36.7 C)  TempSrc: Oral Oral Oral Oral  SpO2: 98%  96%  98%  Weight:      Height:       General: alert, cooperative, and no distress Lochia: appropriate Uterine Fundus: firm U-1 Incision: N/A DVT Evaluation: No evidence of DVT seen on physical exam. Negative Homan's sign. No cords or calf tenderness.  Labs: Lab Results  Component Value Date   WBC 12.6 (H) 05/03/2022   HGB 8.1 (L) 05/03/2022   HCT 25.8 (L) 05/03/2022   MCV 80.6 05/03/2022   PLT 255 05/03/2022      Latest Ref Rng & Units 11/22/2021    1:07 PM  CMP  Glucose 70 - 99 mg/dL 99   BUN 6 - 20 mg/dL 7   Creatinine 0.44 - 1.00 mg/dL 0.48   Sodium 135 - 145 mmol/L 135   Potassium 3.5 - 5.1 mmol/L 4.1   Chloride 98 - 111 mmol/L 102   CO2 22 - 32 mmol/L 22   Calcium 8.9 - 10.3 mg/dL 9.1   Total Protein 6.5 - 8.1 g/dL 6.7   Total Bilirubin 0.3 -  1.2 mg/dL 0.4   Alkaline Phos 38 - 126 U/L 53   AST 15 - 41 U/L 16   ALT 0 - 44 U/L 17     Discharge instruction: per After Visit Summary and "Baby and Me Booklet".  After Visit Meds:  Allergies as of 05/04/2022   No Known Allergies      Medication List     TAKE these medications    acetaminophen 500 MG tablet Commonly known as: TYLENOL Take 1 tablet (500 mg total) by mouth every 6 (six) hours as needed for moderate pain. Do not take while taking other medications like percocet containing tylenol do not exceed 4000 grams in 24 hours   benzocaine-Menthol 20-0.5 % Aero Commonly known as: DERMOPLAST Apply 1 Application topically as needed for irritation (perineal discomfort).   DICLEGIS PO Take by mouth.   famotidine 20 MG tablet Commonly known as: PEPCID Take 1 tablet (20 mg total) by mouth 2 (two) times daily.   ibuprofen 600 MG tablet Commonly known as: ADVIL Take 1 tablet (600 mg total) by mouth every 6 (six) hours as needed for cramping or moderate pain.   oxyCODONE-acetaminophen 5-325 MG tablet Commonly known as: PERCOCET/ROXICET Take 1-2 tablets by mouth every 4 (four) hours as needed for severe pain.    prenatal multivitamin Tabs tablet Take 1 tablet by mouth daily at 12 noon.   promethazine 12.5 MG suppository Commonly known as: PHENERGAN Place 1 suppository (12.5 mg total) rectally every 6 (six) hours as needed for nausea or vomiting.   senna-docusate 8.6-50 MG tablet Commonly known as: Senokot-S Take 2 tablets by mouth at bedtime as needed for mild constipation.        Diet: routine diet  Activity: Advance as tolerated. Pelvic rest for 6 weeks.   Outpatient follow up:1 week - mood check Follow up Appt:No future appointments. Follow up visit: No follow-ups on file.  Postpartum contraception: Undecided  05/04/2022 Sanjuana Kava, MD

## 2022-05-11 ENCOUNTER — Telehealth (HOSPITAL_COMMUNITY): Payer: Self-pay | Admitting: *Deleted

## 2022-05-11 NOTE — Telephone Encounter (Signed)
Mom reports feeling good. No concerns about herself at this time. EPDS declined. Reports feeling emotionally the same as in the hospital. [facility] score=7) [facility]  Duffy Rhody, RN 05-11-2022 at 10:08am

## 2022-07-21 ENCOUNTER — Other Ambulatory Visit: Payer: Self-pay | Admitting: Obstetrics and Gynecology

## 2022-07-21 ENCOUNTER — Other Ambulatory Visit: Payer: Self-pay | Admitting: Obstetrics & Gynecology

## 2022-07-25 MED ORDER — CEFAZOLIN (ANCEF) 1 G IV SOLR: 2.0000 g | INTRAVENOUS | Status: AC

## 2022-09-05 NOTE — Progress Notes (Signed)
Surgical Instructions    Your procedure is scheduled on Monday, 09/12/22.  Report to [facility] Main Entrance "A" at 10:45 A.M., then check in with the Admitting office.  Call this number if you have problems the morning of surgery:  [phone]   If you have any questions prior to your surgery date call [phone]: Open Monday-Friday 8am-4pm If you experience any cold or flu symptoms such as cough, fever, chills, shortness of breath, etc. between now and your scheduled surgery, please notify us at the above number     Remember:  Do not eat or drink after midnight the night before your surgery     Take these medicines the morning of surgery with A SIP OF WATER:  FLUoxetine (PROZAC)  venlafaxine XR (EFFEXOR-XR)   IF NEEDED: clonazePAM (KLONOPIN)   As of today, STOP taking any Aspirin (unless otherwise instructed by your surgeon) Aleve, Naproxen, Ibuprofen, Motrin, Advil, Goody's, BC's, all herbal medications, fish oil, and all vitamins.           Do not wear jewelry or makeup. Do not wear lotions, powders, perfumes or deodorant. Do not shave 48 hours prior to surgery.  Do not bring valuables to the hospital. Do not wear nail polish, gel polish, artificial nails, or any other type of covering on natural nails (fingers and toes) If you have artificial nails or gel coating that need to be removed by a nail salon, please have this removed prior to surgery. Artificial nails or gel coating may interfere with anesthesia's ability to adequately monitor your vital signs.  [facility] is not responsible for any belongings or valuables.    Do NOT Smoke (Tobacco/Vaping)  24 hours prior to your procedure  If you use a CPAP at night, you may bring your mask for your overnight stay.   Contacts, glasses, hearing aids, dentures or partials may not be worn into surgery, please bring cases for these belongings   For patients admitted to the hospital, discharge time will be determined by your  treatment team.   Patients discharged the day of surgery will not be allowed to drive home, and someone needs to stay with them for 24 hours.   SURGICAL WAITING ROOM VISITATION Patients having surgery or a procedure may have no more than 2 support people in the waiting area - these visitors may rotate.   Children under the age of 63 must have an adult with them who is not the patient. If the patient needs to stay at the hospital during part of their recovery, the visitor guidelines for inpatient rooms apply. Pre-op nurse will coordinate an appropriate time for 1 support person to accompany patient in pre-op.  This support person may not rotate.   Please refer to [email or web address] for the visitor guidelines for Inpatients (after your surgery is over and you are in a regular room).    Special instructions:    Oral Hygiene is also important to reduce your risk of infection.  Remember - BRUSH YOUR TEETH THE MORNING OF SURGERY WITH YOUR REGULAR TOOTHPASTE   [facility]- Preparing For Surgery  Before surgery, you can play an important role. Because skin is not sterile, your skin needs to be as free of germs as possible. You can reduce the number of germs on your skin by washing with CHG (chlorahexidine gluconate) Soap before surgery.  CHG is an antiseptic cleaner which kills germs and bonds with the skin to continue killing germs even after washing.  Please do not use if you have an allergy to CHG or antibacterial soaps. If your skin becomes reddened/irritated stop using the CHG.  Do not shave (including legs and underarms) for at least 48 hours prior to first CHG shower. It is OK to shave your face.  Please follow these instructions carefully.     Shower the NIGHT BEFORE SURGERY and the MORNING OF SURGERY with CHG Soap.   If you chose to wash your hair, wash your hair first as usual with your normal shampoo. After you shampoo, rinse  your hair and body thoroughly to remove the shampoo.  Then [employer] and genitals (private parts) with your normal soap and rinse thoroughly to remove soap.  After that Use CHG Soap as you would any other liquid soap. You can apply CHG directly to the skin and wash gently with a scrungie or a clean washcloth.   Apply the CHG Soap to your body ONLY FROM THE NECK DOWN.  Do not use on open wounds or open sores. Avoid contact with your eyes, ears, mouth and genitals (private parts). Wash Face and genitals (private parts)  with your normal soap.   Wash thoroughly, paying special attention to the area where your surgery will be performed.  Thoroughly rinse your body with warm water from the neck down.  DO NOT shower/wash with your normal soap after using and rinsing off the CHG Soap.  Pat yourself dry with a CLEAN TOWEL.  Wear CLEAN PAJAMAS to bed the night before surgery  Place CLEAN SHEETS on your bed the night before your surgery  DO NOT SLEEP WITH PETS.   Day of Surgery: Take a shower with CHG soap. Wear Clean/Comfortable clothing the morning of surgery Do not apply any deodorants/lotions.   Remember to brush your teeth WITH YOUR REGULAR TOOTHPASTE.    If you received a COVID test during your pre-op visit, it is requested that you wear a mask when out in public, stay away from anyone that may not be feeling well, and notify your surgeon if you develop symptoms. If you have been in contact with anyone that has tested positive in the last 10 days, please notify your surgeon.    Please read over the following fact sheets that you were given.

## 2022-09-06 ENCOUNTER — Encounter (HOSPITAL_COMMUNITY): Payer: Self-pay

## 2022-09-06 ENCOUNTER — Encounter (HOSPITAL_COMMUNITY)
Admission: RE | Admit: 2022-09-06 | Discharge: 2022-09-06 | Disposition: A | Discharge status: Home / Self Care | Payer: BC Managed Care – PPO | Source: Ambulatory Visit | Attending: Obstetrics & Gynecology | Admitting: Obstetrics & Gynecology

## 2022-09-06 ENCOUNTER — Other Ambulatory Visit: Payer: Self-pay

## 2022-09-06 VITALS — BP 115/69 | HR 76 | Temp 97.7°F | Resp 17 | Ht 65.0 in | Wt 183.2 lb

## 2022-09-06 DIAGNOSIS — Z01818 Encounter for other preprocedural examination: Secondary | ICD-10-CM

## 2022-09-06 DIAGNOSIS — Z01812 Encounter for preprocedural laboratory examination: Secondary | ICD-10-CM | POA: Insufficient documentation

## 2022-09-06 HISTORY — DX: Attention-deficit hyperactivity disorder, unspecified type: F90.9

## 2022-09-06 LAB — CBC
HCT: 37.7 % (ref 36.0–46.0)
Hemoglobin: 12.1 g/dL (ref 12.0–15.0)
MCH: 25.1 pg — ABNORMAL LOW (ref 26.0–34.0)
MCHC: 32.1 g/dL (ref 30.0–36.0)
MCV: 78.1 fL — ABNORMAL LOW (ref 80.0–100.0)
Platelets: 341 10*3/uL (ref 150–400)
RBC: 4.83 MIL/uL (ref 3.87–5.11)
RDW: 15.9 % — ABNORMAL HIGH (ref 11.5–15.5)
WBC: 5.8 10*3/uL (ref 4.0–10.5)
nRBC: 0 % (ref 0.0–0.2)

## 2022-09-06 LAB — TYPE AND SCREEN
ABO/RH(D): O POS
Antibody Screen: NEGATIVE

## 2022-09-06 NOTE — Progress Notes (Signed)
PCP - Bill Salinas, NP Cardiologist - denies  PPM/ICD - n/a  Chest x-ray - n/a EKG - 10/07/21 Stress Test - denies ECHO - denies Cardiac Cath - denies  Sleep Study - denies CPAP - denies  Last dose of GLP1 agonist-  n/a GLP1 instructions: n/a  Blood Thinner Instructions: n/a Aspirin Instructions: n/a  NPO   COVID TEST- n/a  Anesthesia review: No  Patient denies shortness of breath, fever, cough and chest pain at PAT appointment   All instructions explained to the patient, with a verbal understanding of the material. Patient agrees to go over the instructions while at home for a better understanding. Patient also instructed to self quarantine after being tested for COVID-19. The opportunity to ask questions was provided.

## 2022-09-10 NOTE — H&P (Addendum)
Veronica Cooke is an 26 y.o. female para 3 with chronic pelvic pain, clinical endometriosis and pelvic prolapse here for removal of uterus as well as surgery for prolapse repair.   Pertinent Gynecological History: Menses: monthly, regular.  Bleeding: None currently.  Contraception: none DES exposure: unknown Blood transfusions: none Sexually transmitted diseases: no past history Previous GYN Procedures:  None   Last mammogram:  N/A   Last pap: normal Date: 2017 OB History: G3, P3003   Menstrual History: Menarche age: 61 yrs Patient's last menstrual period was 08/13/2022 (within days).    Past Medical History:  Diagnosis Date   ADHD (attention deficit hyperactivity disorder)    Anemia    Anxiety    Endometriosis    Headache     Past Surgical History:  Procedure Laterality Date   CHOLECYSTECTOMY N/A 05/21/2019   Procedure: LAPAROSCOPIC CHOLECYSTECTOMY WITH INTRAOPERATIVE CHOLANGIOGRAM;  Surgeon: Jovita Kussmaul, MD;  Location: [facility];  Service: General;  Laterality: N/A;   DIAGNOSTIC LAPAROSCOPY  04/2021   to rule out ovarian cyst/torsion,   NO PAST SURGERIES     WISDOM TOOTH EXTRACTION Bilateral 2017    Family History  Problem Relation Age of Onset   Gallbladder disease Mother    Cystinosis Mother    Healthy Father    Cancer Maternal Grandmother        Breast/ Ovarian   Stroke Maternal Grandfather    Diabetes Maternal Grandfather    Diabetes Maternal Aunt    Breast cancer Maternal Aunt 35    Social History:  reports that she has never smoked. She has never used smokeless tobacco. She reports that she does not drink alcohol and does not use drugs.  Allergies: No Known Allergies  Current Outpatient Medications  Medication Instructions   Acetaminophen Extra Strength 500 mg, Oral, Every 6 hours PRN, Do not take while taking other medications like percocet containing tylenol do not exceed 4000 grams in 24 hours   benzocaine-Menthol (DERMOPLAST) 123456 % AERO 1  Application, Topical, As needed   clonazePAM (KLONOPIN) 0.5 mg, Oral, 3 times daily PRN   doxylamine (Sleep) (UNISOM) 25 mg, Oral, At bedtime PRN   famotidine (PEPCID) 20 mg, Oral, 2 times daily   FLUoxetine (PROZAC) 20 mg, Oral, Daily   ibuprofen (ADVIL) 600 mg, Oral, Every 6 hours PRN   ibuprofen (ADVIL) 800 mg, Oral, Every 6 hours PRN   oxyCODONE-acetaminophen (PERCOCET/ROXICET) 5-325 MG tablet 1-2 tablets, Oral, Every 4 hours PRN   Prenatal Vit-Fe Fumarate-FA (PRENATAL MULTIVITAMIN) TABS tablet 1 tablet, Oral, Daily   promethazine (PHENERGAN) 12.5 mg, Rectal, Every 6 hours PRN   senna-docusate (SENOKOT-S) 8.6-50 MG tablet 2 tablets, Oral, At bedtime PRN   venlafaxine XR (EFFEXOR-XR) 37.5 mg, Oral, Daily with breakfast    Review of Systems Constitutional: Denies fevers/chills Cardiovascular: Denies chest pain or palpitations Pulmonary: Denies coughing or wheezing Gastrointestinal: Denies nausea, vomiting or diarrhea Genitourinary: Denies unusual vaginal discharge, dysuria, urgency or frequency. With pelvic pain. With heavy periods.  Musculoskeletal: Denies muscle or joint aches and pain.  Neurology: Denies abnormal sensations such as tingling or numbness.    Last menstrual period 08/13/2022, not currently breastfeeding. Physical Exam Ht 5 ft 5 inches, Wt: 83 kg. BMI 30.49 Constitutional: She is oriented to person, place, and time. She appears well-developed and well-nourished.  HENT:  Head: Normocephalic and atraumatic.  Neck: Normal range of motion.  Cardiovascular: Normal rate, regular rhythm and normal heart sounds.   Respiratory: Effort normal and breath sounds normal.  GI: Soft. Bowel sounds are normal. Genitourinary: Small uterus, no palpable adnexal masses, no uterine tenderness. With rectocele and cystocele.   Neurological: She is alert and oriented to person, place, and time.  Skin: Skin is warm and dry.  Psychiatric: She has a normal mood and affect. Her behavior is  normal.    CBC    Component Value Date/Time   WBC 5.8 09/06/2022 1152   RBC 4.83 09/06/2022 1152   HGB 12.1 09/06/2022 1152   HCT 37.7 09/06/2022 1152   PLT 341 09/06/2022 1152   MCV 78.1 (L) 09/06/2022 1152   MCH 25.1 (L) 09/06/2022 1152   MCHC 32.1 09/06/2022 1152   RDW 15.9 (H) 09/06/2022 1152   LYMPHSABS 1.7 11/02/2021 0923   MONOABS 0.4 11/02/2021 0923   EOSABS 0.2 11/02/2021 0923   BASOSABS 0.0 11/02/2021 0923    left    Latest Ref Rng & Units 11/22/2021    1:07 PM 11/02/2021    9:23 AM 10/07/2021   12:19 PM  BMP  Glucose 70 - 99 mg/dL 99  030  131   BUN 6 - 20 mg/dL 7  9  10    Creatinine 0.44 - 1.00 mg/dL  4.38  8.87   Sodium 135 - 145 mmol/L 135  136  137   Potassium 3.5 - 5.1 mmol/L 4.1  3.7  3.9   Chloride 98 - 111 mmol/L 102  105  104   CO2 22 - 32 mmol/L 22  22  24    Calcium 8.9 - 10.3 mg/dL 9.1  8.7  9.2     O POS   Pelvic ultrasound 02/16/21: Uterus 8.5 x 3.6 x 4.7 cm.  Normal left and right ovary.   Assessment/Plan: 26 y/o Para 2 with chronic pelvic pain, clinical endometriosis, pelvic organ prolapse here for total laparoscopic hysterectomy, bilateral salpingectomy, anterior and posterior repair, cystoscopy, possible laparoscopic assisted vaginal hysterectomy, possible laparotomy  - Admit to [facility] for Outpatient with extended recovery admission. - NPO and IV fluids - This procedure has been fully reviewed with the patient and written informed consent has been obtained.  We discussed risks including but not limited to heavy bleeding, infection and damage to organs.   -She would like conservation of her ovaries if they are normal but removal of any abnormal lesions if present or removal of ovaries if deemed appropriate.     -She understands she may continue with pelvic pain after this procedure and may require additional procedures in the future.    -She understands there are other conservative management options for her symptoms but she  does not desire to pursue them, she desires a hysterectomy.    -We discussed that hysterectomy leads to sterilization.  Patient states understands all these points and she states she does not desire any further pregnancies.  34, MD.  09/10/2022, 10:06 PM

## 2022-09-12 ENCOUNTER — Ambulatory Visit (HOSPITAL_COMMUNITY)
Admission: RE | Admit: 2022-09-12 | Discharge: 2022-09-13 | Disposition: A | Discharge status: Home / Self Care | Payer: BC Managed Care – PPO | Attending: Obstetrics & Gynecology | Admitting: Obstetrics & Gynecology

## 2022-09-12 ENCOUNTER — Ambulatory Visit (HOSPITAL_COMMUNITY): Payer: BC Managed Care – PPO | Admitting: Anesthesiology

## 2022-09-12 ENCOUNTER — Encounter (HOSPITAL_COMMUNITY)
Admission: RE | Disposition: A | Discharge status: Home / Self Care | Payer: Self-pay | Source: Home / Self Care | Attending: Obstetrics & Gynecology

## 2022-09-12 ENCOUNTER — Encounter (HOSPITAL_COMMUNITY): Payer: Self-pay | Admitting: Obstetrics & Gynecology

## 2022-09-12 ENCOUNTER — Other Ambulatory Visit: Payer: Self-pay

## 2022-09-12 DIAGNOSIS — G8929 Other chronic pain: Secondary | ICD-10-CM | POA: Diagnosis not present

## 2022-09-12 DIAGNOSIS — N814 Uterovaginal prolapse, unspecified: Secondary | ICD-10-CM | POA: Insufficient documentation

## 2022-09-12 DIAGNOSIS — N72 Inflammatory disease of cervix uteri: Secondary | ICD-10-CM | POA: Diagnosis not present

## 2022-09-12 DIAGNOSIS — R102 Pelvic and perineal pain unspecified side: Secondary | ICD-10-CM | POA: Diagnosis present

## 2022-09-12 DIAGNOSIS — Z90710 Acquired absence of both cervix and uterus: Secondary | ICD-10-CM | POA: Diagnosis present

## 2022-09-12 DIAGNOSIS — F419 Anxiety disorder, unspecified: Secondary | ICD-10-CM | POA: Diagnosis not present

## 2022-09-12 DIAGNOSIS — Z01818 Encounter for other preprocedural examination: Secondary | ICD-10-CM

## 2022-09-12 HISTORY — PX: CYSTOSCOPY: SHX5120

## 2022-09-12 HISTORY — PX: ANTERIOR AND POSTERIOR REPAIR: SHX5121

## 2022-09-12 HISTORY — PX: TOTAL LAPAROSCOPIC HYSTERECTOMY WITH SALPINGECTOMY: SHX6742

## 2022-09-12 LAB — POCT PREGNANCY, URINE: Preg Test, Ur: NEGATIVE

## 2022-09-12 SURGERY — CYSTOSCOPY
Anesthesia: General

## 2022-09-12 SURGERY — ANTERIOR (CYSTOCELE) AND POSTERIOR REPAIR (RECTOCELE)
Anesthesia: Choice

## 2022-09-12 MED ORDER — ROCURONIUM BROMIDE 10 MG/ML (PF) SYRINGE
PREFILLED_SYRINGE | INTRAVENOUS | Status: DC | PRN
  Administered 2022-09-12 (×2): 20 mg via INTRAVENOUS
  Administered 2022-09-12: 10 mg via INTRAVENOUS
  Administered 2022-09-12: 70 mg via INTRAVENOUS
  Administered 2022-09-12 (×2): 10 mg via INTRAVENOUS
  Administered 2022-09-12: 30 mg via INTRAVENOUS

## 2022-09-12 MED ORDER — FENTANYL CITRATE (PF) 250 MCG/5ML IJ SOLN
INTRAMUSCULAR | Status: AC
  Filled 2022-09-12: qty 5

## 2022-09-12 MED ORDER — ROCURONIUM BROMIDE 10 MG/ML (PF) SYRINGE
PREFILLED_SYRINGE | INTRAVENOUS | Status: AC
  Filled 2022-09-12: qty 10

## 2022-09-12 MED ORDER — MIDAZOLAM HCL 2 MG/2ML IJ SOLN
INTRAMUSCULAR | Status: AC
  Filled 2022-09-12: qty 2

## 2022-09-12 MED ORDER — ACETAMINOPHEN 500 MG PO TABS
1000.0000 mg | ORAL_TABLET | Freq: Four times a day (QID) | ORAL | Status: DC
  Administered 2022-09-12 – 2022-09-13 (×3): 1000 mg via ORAL
  Filled 2022-09-12 (×3): qty 2

## 2022-09-12 MED ORDER — ESTRADIOL 0.1 MG/GM VA CREA
TOPICAL_CREAM | VAGINAL | Status: AC
  Filled 2022-09-12: qty 42.5

## 2022-09-12 MED ORDER — PROPOFOL 10 MG/ML IV BOLUS
INTRAVENOUS | Status: DC | PRN
  Administered 2022-09-12: 200 mg via INTRAVENOUS

## 2022-09-12 MED ORDER — EPHEDRINE 5 MG/ML INJ
INTRAVENOUS | Status: AC
  Filled 2022-09-12: qty 5

## 2022-09-12 MED ORDER — SODIUM CHLORIDE (PF) 0.9 % IJ SOLN
INTRAMUSCULAR | Status: AC
  Filled 2022-09-12: qty 50

## 2022-09-12 MED ORDER — ORAL CARE MOUTH RINSE: 15.0000 mL | Freq: Once | OROMUCOSAL | Status: AC

## 2022-09-12 MED ORDER — HYDROMORPHONE HCL 1 MG/ML IJ SOLN
0.2500 mg | INTRAMUSCULAR | Status: DC | PRN
  Administered 2022-09-12 (×3): 0.5 mg via INTRAVENOUS

## 2022-09-12 MED ORDER — HYDROMORPHONE HCL 1 MG/ML IJ SOLN
INTRAMUSCULAR | Status: AC
  Filled 2022-09-12: qty 1

## 2022-09-12 MED ORDER — VASOPRESSIN 20 UNIT/ML IV SOLN
INTRAVENOUS | Status: AC
  Filled 2022-09-12: qty 1

## 2022-09-12 MED ORDER — VENLAFAXINE HCL ER 37.5 MG PO CP24
37.5000 mg | ORAL_CAPSULE | Freq: Every day | ORAL | Status: DC
  Administered 2022-09-13: 37.5 mg via ORAL
  Filled 2022-09-12: qty 1

## 2022-09-12 MED ORDER — FLUOXETINE HCL 20 MG PO CAPS
20.0000 mg | ORAL_CAPSULE | Freq: Every day | ORAL | Status: DC
  Administered 2022-09-13: 20 mg via ORAL
  Filled 2022-09-12: qty 1

## 2022-09-12 MED ORDER — OXYCODONE HCL 5 MG/5ML PO SOLN: 5.0000 mg | Freq: Once | ORAL | Status: DC | PRN

## 2022-09-12 MED ORDER — SODIUM CHLORIDE 0.9 % IV SOLN
INTRAVENOUS | Status: DC | PRN
  Administered 2022-09-12: 30 mL

## 2022-09-12 MED ORDER — PROPOFOL 10 MG/ML IV BOLUS
INTRAVENOUS | Status: AC
  Filled 2022-09-12: qty 20

## 2022-09-12 MED ORDER — CEFAZOLIN SODIUM-DEXTROSE 2-3 GM-%(50ML) IV SOLR
INTRAVENOUS | Status: DC | PRN
  Administered 2022-09-12 (×2): 2 g via INTRAVENOUS

## 2022-09-12 MED ORDER — ONDANSETRON HCL 4 MG/2ML IJ SOLN
INTRAMUSCULAR | Status: DC | PRN
  Administered 2022-09-12: 4 mg via INTRAVENOUS

## 2022-09-12 MED ORDER — EPHEDRINE SULFATE-NACL 50-0.9 MG/10ML-% IV SOSY
PREFILLED_SYRINGE | INTRAVENOUS | Status: DC | PRN
  Administered 2022-09-12 (×2): 5 mg via INTRAVENOUS

## 2022-09-12 MED ORDER — ONDANSETRON HCL 4 MG/2ML IJ SOLN: 4.0000 mg | Freq: Once | INTRAMUSCULAR | Status: DC | PRN

## 2022-09-12 MED ORDER — PHENYLEPHRINE 80 MCG/ML (10ML) SYRINGE FOR IV PUSH (FOR BLOOD PRESSURE SUPPORT)
PREFILLED_SYRINGE | INTRAVENOUS | Status: DC | PRN
  Administered 2022-09-12 (×4): 80 ug via INTRAVENOUS

## 2022-09-12 MED ORDER — LACTATED RINGERS IV SOLN: INTRAVENOUS | Status: DC

## 2022-09-12 MED ORDER — PHENYLEPHRINE 80 MCG/ML (10ML) SYRINGE FOR IV PUSH (FOR BLOOD PRESSURE SUPPORT)
PREFILLED_SYRINGE | INTRAVENOUS | Status: AC
  Filled 2022-09-12: qty 10

## 2022-09-12 MED ORDER — DEXAMETHASONE SODIUM PHOSPHATE 10 MG/ML IJ SOLN
INTRAMUSCULAR | Status: DC | PRN
  Administered 2022-09-12: 8 mg via INTRAVENOUS

## 2022-09-12 MED ORDER — MENTHOL 3 MG MT LOZG: 1.0000 | LOZENGE | OROMUCOSAL | Status: DC | PRN

## 2022-09-12 MED ORDER — CHLORHEXIDINE GLUCONATE 0.12 % MT SOLN
15.0000 mL | Freq: Once | OROMUCOSAL | Status: AC
  Administered 2022-09-12: 15 mL via OROMUCOSAL
  Filled 2022-09-12: qty 15

## 2022-09-12 MED ORDER — KETOROLAC TROMETHAMINE 30 MG/ML IJ SOLN: 30.0000 mg | Freq: Once | INTRAMUSCULAR | Status: DC | PRN

## 2022-09-12 MED ORDER — MIDAZOLAM HCL 2 MG/2ML IJ SOLN
2.0000 mg | Freq: Once | INTRAMUSCULAR | Status: AC
  Administered 2022-09-12: 2 mg via INTRAVENOUS

## 2022-09-12 MED ORDER — LIDOCAINE-EPINEPHRINE 1 %-1:100000 IJ SOLN
INTRAMUSCULAR | Status: DC | PRN
  Administered 2022-09-12: 10 mL

## 2022-09-12 MED ORDER — KETOROLAC TROMETHAMINE 30 MG/ML IJ SOLN
30.0000 mg | Freq: Four times a day (QID) | INTRAMUSCULAR | Status: DC
  Administered 2022-09-12 – 2022-09-13 (×3): 30 mg via INTRAVENOUS
  Filled 2022-09-12 (×3): qty 1

## 2022-09-12 MED ORDER — DEXMEDETOMIDINE HCL IN NACL 80 MCG/20ML IV SOLN
INTRAVENOUS | Status: DC | PRN
  Administered 2022-09-12 (×5): 4 ug via BUCCAL

## 2022-09-12 MED ORDER — IBUPROFEN 600 MG PO TABS: 600.0000 mg | ORAL_TABLET | Freq: Four times a day (QID) | ORAL | Status: DC

## 2022-09-12 MED ORDER — DOXYLAMINE SUCCINATE (SLEEP) 25 MG PO TABS: 25.0000 mg | ORAL_TABLET | Freq: Every evening | ORAL | Status: DC | PRN

## 2022-09-12 MED ORDER — CEFAZOLIN SODIUM-DEXTROSE 2-4 GM/100ML-% IV SOLN
INTRAVENOUS | Status: AC
  Filled 2022-09-12: qty 100

## 2022-09-12 MED ORDER — MIDAZOLAM HCL 2 MG/2ML IJ SOLN
INTRAMUSCULAR | Status: DC | PRN
  Administered 2022-09-12: 2 mg via INTRAVENOUS

## 2022-09-12 MED ORDER — SUGAMMADEX SODIUM 200 MG/2ML IV SOLN
INTRAVENOUS | Status: DC | PRN
  Administered 2022-09-12: 200 mg via INTRAVENOUS

## 2022-09-12 MED ORDER — LIDOCAINE 2% (20 MG/ML) 5 ML SYRINGE
INTRAMUSCULAR | Status: AC
  Filled 2022-09-12: qty 5

## 2022-09-12 MED ORDER — VASOPRESSIN 20 UNIT/ML IV SOLN
INTRAVENOUS | Status: DC | PRN
  Administered 2022-09-12: 41 mL via INTRAMUSCULAR

## 2022-09-12 MED ORDER — HYDROMORPHONE HCL 1 MG/ML IJ SOLN
0.2000 mg | INTRAMUSCULAR | Status: DC | PRN
  Administered 2022-09-12: 0.5 mg via INTRAVENOUS
  Administered 2022-09-13: 0.6 mg via INTRAVENOUS
  Filled 2022-09-12 (×2): qty 1

## 2022-09-12 MED ORDER — ESTRADIOL 0.1 MG/GM VA CREA
TOPICAL_CREAM | VAGINAL | Status: DC | PRN
  Administered 2022-09-12: 1 via VAGINAL

## 2022-09-12 MED ORDER — DOCUSATE SODIUM 100 MG PO CAPS
100.0000 mg | ORAL_CAPSULE | Freq: Two times a day (BID) | ORAL | Status: DC
  Administered 2022-09-12 – 2022-09-13 (×2): 100 mg via ORAL
  Filled 2022-09-12 (×2): qty 1

## 2022-09-12 MED ORDER — ONDANSETRON HCL 4 MG PO TABS: 4.0000 mg | ORAL_TABLET | Freq: Four times a day (QID) | ORAL | Status: DC | PRN

## 2022-09-12 MED ORDER — ONDANSETRON HCL 4 MG/2ML IJ SOLN
INTRAMUSCULAR | Status: AC
  Filled 2022-09-12: qty 2

## 2022-09-12 MED ORDER — OXYCODONE HCL 5 MG PO TABS: 5.0000 mg | ORAL_TABLET | Freq: Once | ORAL | Status: DC | PRN

## 2022-09-12 MED ORDER — SODIUM CHLORIDE (PF) 0.9 % IJ SOLN
INTRAMUSCULAR | Status: AC
  Filled 2022-09-12: qty 100

## 2022-09-12 MED ORDER — ONDANSETRON HCL 4 MG/2ML IJ SOLN: 4.0000 mg | Freq: Four times a day (QID) | INTRAMUSCULAR | Status: DC | PRN

## 2022-09-12 MED ORDER — FENTANYL CITRATE (PF) 250 MCG/5ML IJ SOLN
INTRAMUSCULAR | Status: DC | PRN
  Administered 2022-09-12 (×4): 50 ug via INTRAVENOUS
  Administered 2022-09-12: 100 ug via INTRAVENOUS
  Administered 2022-09-12: 50 ug via INTRAVENOUS

## 2022-09-12 MED ORDER — POVIDONE-IODINE 10 % EX SWAB
2.0000 | Freq: Once | CUTANEOUS | Status: AC
  Administered 2022-09-12: 2 via TOPICAL

## 2022-09-12 MED ORDER — LIDOCAINE-EPINEPHRINE 1 %-1:100000 IJ SOLN
INTRAMUSCULAR | Status: AC
  Filled 2022-09-12: qty 1

## 2022-09-12 MED ORDER — POVIDONE-IODINE 10 % EX SWAB: 2.0000 | Freq: Once | CUTANEOUS | Status: DC

## 2022-09-12 MED ORDER — LIDOCAINE 2% (20 MG/ML) 5 ML SYRINGE
INTRAMUSCULAR | Status: DC | PRN
  Administered 2022-09-12: 100 mg via INTRAVENOUS

## 2022-09-12 MED ORDER — PANTOPRAZOLE SODIUM 40 MG PO TBEC
40.0000 mg | DELAYED_RELEASE_TABLET | Freq: Every day | ORAL | Status: DC
  Administered 2022-09-12 – 2022-09-13 (×2): 40 mg via ORAL
  Filled 2022-09-12 (×2): qty 1

## 2022-09-12 MED ORDER — CLONAZEPAM 0.5 MG PO TABS: 0.5000 mg | ORAL_TABLET | Freq: Three times a day (TID) | ORAL | Status: DC | PRN

## 2022-09-12 MED ORDER — DEXAMETHASONE SODIUM PHOSPHATE 10 MG/ML IJ SOLN
INTRAMUSCULAR | Status: AC
  Filled 2022-09-12: qty 1

## 2022-09-12 MED ORDER — ROPIVACAINE HCL 5 MG/ML IJ SOLN
INTRAMUSCULAR | Status: AC
  Filled 2022-09-12: qty 30

## 2022-09-12 MED ORDER — KETOROLAC TROMETHAMINE 30 MG/ML IJ SOLN
INTRAMUSCULAR | Status: DC | PRN
  Administered 2022-09-12: 30 mg via INTRAVENOUS

## 2022-09-12 MED ORDER — CEFAZOLIN SODIUM 1 G IJ SOLR
INTRAMUSCULAR | Status: AC
  Filled 2022-09-12: qty 20

## 2022-09-12 MED ORDER — KETOROLAC TROMETHAMINE 30 MG/ML IJ SOLN
INTRAMUSCULAR | Status: AC
  Filled 2022-09-12: qty 1

## 2022-09-12 MED ORDER — BUPIVACAINE-EPINEPHRINE (PF) 0.5% -1:200000 IJ SOLN
INTRAMUSCULAR | Status: AC
  Filled 2022-09-12: qty 30

## 2022-09-12 MED ORDER — OXYCODONE HCL 5 MG PO TABS
5.0000 mg | ORAL_TABLET | ORAL | Status: DC | PRN
  Administered 2022-09-12: 10 mg via ORAL
  Administered 2022-09-13: 5 mg via ORAL
  Administered 2022-09-13 (×2): 10 mg via ORAL
  Filled 2022-09-12: qty 1
  Filled 2022-09-12 (×3): qty 2

## 2022-09-12 SURGICAL SUPPLY — 79 items
BARRIER ADHS 3X4 INTERCEED (GAUZE/BANDAGES/DRESSINGS) IMPLANT
BENZOIN TINCTURE PRP APPL 2/3 (GAUZE/BANDAGES/DRESSINGS) ×2 IMPLANT
BLADE SURG 15 STRL LF DISP TIS (BLADE) ×2 IMPLANT
BLADE SURG 15 STRL SS (BLADE) ×2
CABLE HIGH FREQUENCY MONO STRZ (ELECTRODE) IMPLANT
CANISTER SUCT 3000ML PPV (MISCELLANEOUS) ×2 IMPLANT
COVER BACK TABLE 60X90IN (DRAPES) ×2 IMPLANT
COVER MAYO STAND STRL (DRAPES) ×2 IMPLANT
DERMABOND ADVANCED .7 DNX12 (GAUZE/BANDAGES/DRESSINGS) IMPLANT
DRAPE SHEET LG 3/4 BI-LAMINATE (DRAPES) ×4 IMPLANT
DRSG OPSITE POSTOP 3X4 (GAUZE/BANDAGES/DRESSINGS) IMPLANT
DURAPREP 26ML APPLICATOR (WOUND CARE) ×2 IMPLANT
ELECT BLADE 6.5 EXT (BLADE) IMPLANT
ELECT REM PT RETURN 9FT ADLT (ELECTROSURGICAL) ×2
ELECTRODE REM PT RTRN 9FT ADLT (ELECTROSURGICAL) ×2 IMPLANT
GAUZE PACKING 2X5 YD STRL (GAUZE/BANDAGES/DRESSINGS) ×2 IMPLANT
GAUZE PACKING FOLDED 2  STR (GAUZE/BANDAGES/DRESSINGS) ×2
GAUZE PACKING FOLDED 2 STR (GAUZE/BANDAGES/DRESSINGS) IMPLANT
GLOVE BIO SURGEON STRL SZ7.5 (GLOVE) ×2 IMPLANT
GLOVE BIOGEL PI IND STRL 7.0 (GLOVE) ×4 IMPLANT
GLOVE BIOGEL PI IND STRL 7.5 (GLOVE) ×2 IMPLANT
GLOVE SURG UNDER POLY LF SZ7 (GLOVE) ×10 IMPLANT
GOWN STRL REUS W/ TWL LRG LVL3 (GOWN DISPOSABLE) ×8 IMPLANT
GOWN STRL REUS W/TWL LRG LVL3 (GOWN DISPOSABLE) ×8
KIT TURNOVER KIT B (KITS) ×4 IMPLANT
LEGGING LITHOTOMY PAIR STRL (DRAPES) ×2 IMPLANT
LIGASURE LAP L-HOOKWIRE 5 44CM (INSTRUMENTS) IMPLANT
LIGASURE VESSEL 5MM BLUNT TIP (ELECTROSURGICAL) IMPLANT
MANIPULATOR ADVINCU DEL 3.0 PL (MISCELLANEOUS) IMPLANT
MANIPULATOR ADVINCU DEL 4.0 PL (MISCELLANEOUS) IMPLANT
NDL INSUFFLATION 14GA 120MM (NEEDLE) IMPLANT
NEEDLE HYPO 22GX1.5 SAFETY (NEEDLE) ×2 IMPLANT
NEEDLE INSUFFLATION 14GA 120MM (NEEDLE) IMPLANT
NS IRRIG 1000ML POUR BTL (IV SOLUTION) ×4 IMPLANT
PACK LAVH (CUSTOM PROCEDURE TRAY) ×2 IMPLANT
PACK ROBOTIC GOWN (GOWN DISPOSABLE) ×2 IMPLANT
PACK TRENDGUARD 450 HYBRID PRO (MISCELLANEOUS) IMPLANT
PACK VAGINAL WOMENS (CUSTOM PROCEDURE TRAY) ×2 IMPLANT
POUCH SPECIMEN RETRIEVAL 10MM (ENDOMECHANICALS) IMPLANT
PROTECTOR NERVE ULNAR (MISCELLANEOUS) ×4 IMPLANT
SET CYSTO W/LG BORE CLAMP LF (SET/KITS/TRAYS/PACK) ×2 IMPLANT
SET IRRIG TUBING LAPAROSCOPIC (IRRIGATION / IRRIGATOR) IMPLANT
SET TUBE SMOKE EVAC HIGH FLOW (TUBING) ×2 IMPLANT
SLEEVE ENDOPATH XCEL 5M (ENDOMECHANICALS) ×2 IMPLANT
SOLUTION ELECTROLUBE (MISCELLANEOUS) ×2 IMPLANT
SPECIMEN JAR MEDIUM (MISCELLANEOUS) ×2 IMPLANT
SPIKE FLUID TRANSFER (MISCELLANEOUS) ×2 IMPLANT
STRIP CLOSURE SKIN 1/4X3 (GAUZE/BANDAGES/DRESSINGS) ×2 IMPLANT
SUT MNCRL AB 3-0 PS2 27 (SUTURE) ×2 IMPLANT
SUT MNCRL AB 4-0 PS2 18 (SUTURE) ×2 IMPLANT
SUT PROLENE 0 CT 1 30 (SUTURE) IMPLANT
SUT VIC AB 0 CT1 18XCR BRD8 (SUTURE) IMPLANT
SUT VIC AB 0 CT1 27 (SUTURE) ×4
SUT VIC AB 0 CT1 27XBRD ANBCTR (SUTURE) ×14 IMPLANT
SUT VIC AB 0 CT1 8-18 (SUTURE)
SUT VIC AB 1 CT1 36 (SUTURE) IMPLANT
SUT VIC AB 2-0 CT1 (SUTURE) IMPLANT
SUT VIC AB 2-0 CT1 27 (SUTURE) ×18
SUT VIC AB 2-0 CT1 TAPERPNT 27 (SUTURE) ×8 IMPLANT
SUT VIC AB 2-0 SH 27 (SUTURE) ×14
SUT VIC AB 2-0 SH 27XBRD (SUTURE) ×8 IMPLANT
SUT VIC AB 3-0 SH 27 (SUTURE) ×8
SUT VIC AB 3-0 SH 27X BRD (SUTURE) IMPLANT
SUT VICRYL 0 TIES 12 18 (SUTURE) ×2 IMPLANT
SUT VICRYL 0 UR6 27IN ABS (SUTURE) ×4 IMPLANT
SUT VICRYL 4-0 PS2 18IN ABS (SUTURE) IMPLANT
SUT VLOC 180 0 9IN  GS21 (SUTURE) ×2
SUT VLOC 180 0 9IN GS21 (SUTURE) IMPLANT
TOWEL GREEN STERILE FF (TOWEL DISPOSABLE) ×8 IMPLANT
TRAY FOLEY W/BAG SLVR 14FR (SET/KITS/TRAYS/PACK) ×2 IMPLANT
TRAY FOLEY W/BAG SLVR 16FR (SET/KITS/TRAYS/PACK) ×2
TRAY FOLEY W/BAG SLVR 16FR ST (SET/KITS/TRAYS/PACK) ×2 IMPLANT
TRENDGUARD 450 HYBRID PRO PACK (MISCELLANEOUS)
TROCAR BALLN 12MMX100 BLUNT (TROCAR) IMPLANT
TROCAR PORT AIRSEAL 8X120 (TROCAR) IMPLANT
TROCAR XCEL NON-BLD 11X100MML (ENDOMECHANICALS) IMPLANT
TROCAR XCEL NON-BLD 5MMX100MML (ENDOMECHANICALS) ×2 IMPLANT
UNDERPAD 30X36 HEAVY ABSORB (UNDERPADS AND DIAPERS) ×2 IMPLANT
WARMER LAPAROSCOPE (MISCELLANEOUS) ×2 IMPLANT

## 2022-09-12 NOTE — Op Note (Signed)
Name:  Veronica Cooke MRN: [id number] DOB: [birth year] Date of procedure: 09/12/2022.  Pre-op diagnosis:  1. Chronic pelvic pain. 2. Clinical endometriosis.  3. Vaginal pressure symptoms. 4. Uterine prolapse. 5. Rectocele. 6. Cystocele. 7. Desire to decrease future risk of ovarian and fallopian tube cancer.         Post-op diagnosis: Same as above.     Surgeries:  1. Total Laparoscopic Hysterectomy and Bilateral salpingectomy. 2. Cystoscopy.    Surgeon: Dr. Hoover Browns.    Assistant: Dr. Osborn Coho.  Attending attestation: I was present and scrubbed and performed the procedure and my assistant was required due to the complexity of anatomy.      Anesthesia: General Endotracheal  EBL:  100 cc  IV Fluids:   1200 LR  Urine output: 400 cc clear urine   Complications: None.   Findings:   8 week sized uterus with stage 1 prolapse: 85 grams total weight.   Cystocele (Stage 1) and Rectocele (Stage 3) pelvic organ prolapse.  3. Normal right and left fallopian tubes, normal left and right ovaries. 4. Normal ureters bilaterally with peristalsis identified at the pelvic brim.  5. Normal bladder with strong bilateral ureteral jets seen during cystoscopy.   6. Normal appendix, normal appearing liver edge, normal bowel, normal stomach.       Indications: 26 y/o female with chronic pelvic pain, vaginal pressure symptoms, uterus, bladder and rectum prolapse here for definitive surgical therapy.  She had failed conservative treatment of kegel exercises.  Patient also desired opportunistic bilateral salpingectomy to decrease her future risk of fallopian tube and ovarian cancer.     Procedure: Informed consent was obtained from the patient. She was taken to the operating room where anesthesia was administered.  She was carefully positioned in the operating room table in dorsal lithotomy position with both arms tucked to her sides.  2 g IV ancef was given pre-operatively.  She was prepped  with chlora Prep abdominally and 4% hibiclens vaginally and draped in the usual sterile fashion.  A foley catheter was  placed in the bladder. Weighted speculum and anterior retractor were placed vaginally to view the cervix.  Tenaculum was placed at cervix and uterus sounded to 7.5 cm.  The cervix was already dilated (parous cervix).  Cervical size was found to be 3 cm and #3  Advincula delineator uterine manipulator was placed into the uterus in the usual fashion.  Patient placed in low dorsal lithotomy position.  Attention was then turned to the abdomen.  0.25% marcaine with epinephrine was injected in inferior crease of umbilicus.  The skin was incised and Veress needle used to enter the abdomen while elevating the abdominal wall.  Correct placement was confirmed with irrigation and suction test.  Gas was instilled to a maximum pressure of 15 mm Hg.  The 12 mm  Applied Bladeless trocar was placed under direct visualization and correct placement confirmed.  She was placed in T-burg position and maximum gas pressure set was 15 mm Hg.  Two other side ports were placed on the left side of the abdomen under direct visualization, one 61mm at level of umbulicus about 10cm laterally and a 80mm Airseal trocar at the lower level 2 fingerbreadths medial and superior to anterior superior iliac spine and at about 10 cm distance between the two trocar sites on the left side.  One 61mm port was placed on the opposite lower right side under direct visualization.  The bowel was moved out of the way with  an atraumatic probe and the abdomen was surveyed with the above findings.  The left fallopian tube, mesosalpinx, uteroovarian ligament and round ligament were ligated and then transected with the 51mm Ligasure with L Hook, which was the main instrument used for the case.  Active uterine manipulation was provided by the surgical tech and assistant during the entire procedure as needed for exposure of the surgical field.  From the  round ligament level, the bladder was dissected off the pubo vesical cervical fascia.  The posterior peritoneum was also taken down, then the left uterine artery was ligated with the ligasure just above the [facility] colpotomizer.   Attention was then turned to the right side where a similar procedure was performed.  The right uterine artery was similarly ligated just above the Koh cup and then transected at the level of the Koh cup.  The left uterine artery was similarly transected as it had already been ligated.  Anteriorly, the upper level of the Koh cup was palpated and the colpotomy was performed at that level  with the L- Hook of the ligasure and this was extended  circumferentially while pushing upwards on the manipulator to perform complete colpotomy.  The uterus with cervix was delivered through the vagina.  Vaginal balloon was placed in. Attention was then turned back to the abdomen and gas was re insufflated.  The vaginal cuff was then closed with a running stitch of 0 V-lock 9 inch laparoscopically, ensuring to incorporate the bilateral uterosacral ligaments in the cuff closure and posterior peritoneum.  Irrigation was applied and suctioned out.  Good hemostasis was noted on the vaginal cuff.  The ureters were noted to be normal bilaterally and with peristalsis.  Intercede was placed over the vaginal cuff.  The vaginal cuff was palpated and was noted to be closed.  60 cc of mixture of 30 cc 0.5% ropivacine and 30 cc NS was instilled into the pelvis.  Gas was turned off and gas allowed to escape, ports were removed with visualization. Anesthesia gave 5 manual inflations of thel lungs and patient taken out of Trendelenburg.  The fascia over the 87mm incision was grasped with kochers and elevated. 0-vicryl suture was placed to close the fascia. 4-0 monocryl used to close all skin incisions in subcuticular stitch and dermabond was placed on.  Cystoscopy was performed with the 70 degree scope and above findings  noted.  Foley catheter was replaced. Instrument and lap counts were correct.  Attention was then turned to the vagina for prolapse surgery whereby I assisted Dr. Su Hilt with this part of surgery.  Dr. Su Hilt has dictated a separate operative note.     Specimen:  Uterus with cervix.  Right and left fallopian tubes.    Disposition: [facility] OR 8 for continued surgery.   Hoover Browns, MD. 09/13/22. 11.15 pm.

## 2022-09-12 NOTE — Transfer of Care (Signed)
Immediate Anesthesia Transfer of Care Note  Patient: Veronica Cooke  Procedure(s) Performed: CYSTOSCOPY TOTAL LAPAROSCOPIC HYSTERECTOMY WITH SALPINGECTOMY ANTERIOR (CYSTOCELE) AND POSTERIOR REPAIR (RECTOCELE)  Patient Location: PACU  Anesthesia Type:General  Level of Consciousness: lethargic  Airway & Oxygen Therapy: Patient Spontanous Breathing and Patient connected to nasal cannula oxygen  Post-op Assessment: Report given to RN  Post vital signs: Reviewed and stable  Last Vitals:  Vitals Value Taken Time  BP 106/57 09/12/22 1843  Temp    Pulse 92 09/12/22 1844  Resp 17 09/12/22 1844  SpO2 96 % 09/12/22 1844  Vitals shown include unvalidated device data.  Last Pain:  Vitals:   09/12/22 1114  TempSrc:   PainSc: 5       Patients Stated Pain Goal: 3 (09/12/22 1114)  Complications: No notable events documented.

## 2022-09-12 NOTE — Anesthesia Procedure Notes (Signed)
Procedure Name: Intubation Date/Time: 09/12/2022 1:20 PM  Performed by: Erick Colace, CRNAPre-anesthesia Checklist: Patient identified, Emergency Drugs available, Suction available and Patient being monitored Patient Re-evaluated:Patient Re-evaluated prior to induction Oxygen Delivery Method: Circle system utilized Preoxygenation: Pre-oxygenation with 100% oxygen Induction Type: IV induction Ventilation: Mask ventilation without difficulty Laryngoscope Size: Mac and 4 Grade View: Grade I Tube type: Oral Tube size: 7.0 mm Number of attempts: 1 Airway Equipment and Method: Stylet Placement Confirmation: ETT inserted through vocal cords under direct vision, positive ETCO2 and breath sounds checked- equal and bilateral Secured at: 22 cm Tube secured with: Tape Dental Injury: Teeth and Oropharynx as per pre-operative assessment

## 2022-09-12 NOTE — Op Note (Signed)
Preop Diagnosis: Cystocele and Rectocele  Postop Diagnosis: Cystocele, Rectocele and Enterocele  Procedure: 1.ANTERIOR AND POSTERIOR REPAIR 2.CYSTOSCOPY  Anesthesia: General    Attending: Osborn Coho, MD   Assistant: Hoover Browns, MD  Findings: Cystocele, rectocele and enterocele    Pathology: N/a  Fluids: See flowsheet  UOP: See flowsheet  EBL: See flowsheet  Complications: None  Procedure: The patient was in the operating room after the risks, benefits and alternatives were discussed with the patient, the patient verbalized understanding and consent signed and witnessed.  TLH and BS had been performed with Dr. Sallye Ober.  Cystoscopy was performed and bilateral ureters were noted to efflux without difficulty and there were no inadvertent bladder injuries noted.  A weighted speculum was placed in the patient's vagina and the anterior vaginal wall was retracted using Deaver retractors.  The anterior vaginal wall was injected with dilute pitressin and the anterior vaginal wall was then incised and dissected away from the underlying layer of tissue.  The cystocele was repaired with a purse string and plication stitches using 2-0 vicryl.  The excess vaginal wall tissue was excised and repaired with a running interlocking stitch of 2-0 Vicryl.  Attention was then turned to the posterior vaginal wall where dilute Pitressin was injected. The posterior vaginal wall was incised and the underlying tissue was dissected away from the posterior vaginal wall. The enterocele was repaired with a purse string stitch of 3-0 vicryl.  The rectocele was repaired using plication stitches of 2-0 Vicryl.  Excess posterior vaginal wall tissue was excised and the posterior vaginal wall repaired with 2-0 vicryl via a running interlocking stitch.  The perineum was repaired with 3-0 Vicryl via a subcuticular stitch. The vagina was packed with estrogen-soaked packing.  The patient tolerated the procedure well and was  awaiting return to the recovery room in good condition.

## 2022-09-12 NOTE — Anesthesia Postprocedure Evaluation (Signed)
Anesthesia Post Note  Patient: Veronica Cooke  Procedure(s) Performed: CYSTOSCOPY TOTAL LAPAROSCOPIC HYSTERECTOMY WITH SALPINGECTOMY ANTERIOR (CYSTOCELE) AND POSTERIOR REPAIR (RECTOCELE)     Patient location during evaluation: PACU Anesthesia Type: General Level of consciousness: awake and alert Pain management: pain level controlled Vital Signs Assessment: post-procedure vital signs reviewed and stable Respiratory status: spontaneous breathing, nonlabored ventilation, respiratory function stable and patient connected to nasal cannula oxygen Cardiovascular status: blood pressure returned to baseline and stable Postop Assessment: no apparent nausea or vomiting Anesthetic complications: no   No notable events documented.  Last Vitals:  Vitals:   09/12/22 2026 09/12/22 2100  BP: 108/62 113/60  Pulse: 90 71  Resp: 17 18  Temp: 36.5 C 36.5 C  SpO2: 100% 100%    Last Pain:  Vitals:   09/12/22 2100  TempSrc: Oral  PainSc:                  Shelton Silvas

## 2022-09-12 NOTE — Anesthesia Preprocedure Evaluation (Signed)
Anesthesia Evaluation  Patient identified by MRN, date of birth, ID band Patient awake    Reviewed: Allergy & Precautions, H&P , NPO status , Patient's Chart, lab work & pertinent test results  Airway Mallampati: II  TM Distance: >3 FB Neck ROM: Full    Dental no notable dental hx.    Pulmonary neg pulmonary ROS   Pulmonary exam normal breath sounds clear to auscultation       Cardiovascular negative cardio ROS Normal cardiovascular exam Rhythm:Regular Rate:Normal     Neuro/Psych   Anxiety     negative neurological ROS  negative psych ROS   GI/Hepatic negative GI ROS, Neg liver ROS,,,  Endo/Other  negative endocrine ROS    Renal/GU negative Renal ROS  negative genitourinary   Musculoskeletal negative musculoskeletal ROS (+)    Abdominal   Peds negative pediatric ROS (+)  Hematology negative hematology ROS (+)   Anesthesia Other Findings   Reproductive/Obstetrics negative OB ROS                             Anesthesia Physical Anesthesia Plan  ASA: 2  Anesthesia Plan: General   Post-op Pain Management: Lidocaine infusion*   Induction: Intravenous  PONV Risk Score and Plan: 3 and Ondansetron, Dexamethasone, Midazolam, Treatment may vary due to age or medical condition and Scopolamine patch - Pre-op  Airway Management Planned: Oral ETT  Additional Equipment:   Intra-op Plan:   Post-operative Plan: Extubation in OR  Informed Consent: I have reviewed the patients History and Physical, chart, labs and discussed the procedure including the risks, benefits and alternatives for the proposed anesthesia with the patient or authorized representative who has indicated his/her understanding and acceptance.     Dental advisory given  Plan Discussed with: CRNA and Surgeon  Anesthesia Plan Comments:        Anesthesia Quick Evaluation

## 2022-09-12 NOTE — Interval H&P Note (Signed)
History and Physical Interval Note:  09/12/2022 1:01 PM  Veronica Cooke  has presented today for surgery, with the diagnosis of PELVIC AND PERINEAL PAIN.  The various methods of treatment have been discussed with the patient and family. After consideration of risks, benefits and other options for treatment, the patient has consented to  Procedure(s): TOTAL LAPAROSCOPIC HYSTERECTOMY WITH BILATERAL SALPINGECTOMY (N/A) CYSTOSCOPY (N/A) ANTERIOR (CYSTOCELE) AND POSTERIOR REPAIR (RECTOCELE), CYSTOSCOPY (N/A)as a surgical intervention.  The patient's history has been reviewed, patient examined, no change in status, stable for surgery.  I have reviewed the patient's chart and labs.  Questions were answered to the patient's satisfaction.     Prescilla Sours, MD.

## 2022-09-13 ENCOUNTER — Encounter (HOSPITAL_COMMUNITY): Payer: Self-pay | Admitting: Obstetrics & Gynecology

## 2022-09-13 ENCOUNTER — Other Ambulatory Visit: Payer: Self-pay

## 2022-09-13 DIAGNOSIS — G8929 Other chronic pain: Secondary | ICD-10-CM | POA: Diagnosis not present

## 2022-09-13 LAB — BASIC METABOLIC PANEL
Anion gap: 8 (ref 5–15)
BUN: 5 mg/dL — ABNORMAL LOW (ref 6–20)
CO2: 23 mmol/L (ref 22–32)
Calcium: 8.1 mg/dL — ABNORMAL LOW (ref 8.9–10.3)
Chloride: 105 mmol/L (ref 98–111)
Creatinine, Ser: 0.74 mg/dL (ref 0.44–1.00)
GFR, Estimated: >60 mL/min (ref >60)
Glucose, Bld: 114 mg/dL — ABNORMAL HIGH (ref 70–99)
Potassium: 3.6 mmol/L (ref 3.5–5.1)
Sodium: 136 mmol/L (ref 135–145)

## 2022-09-13 LAB — CBC
HCT: 31.3 % — ABNORMAL LOW (ref 36.0–46.0)
Hemoglobin: 10.1 g/dL — ABNORMAL LOW (ref 12.0–15.0)
MCH: 25.7 pg — ABNORMAL LOW (ref 26.0–34.0)
MCHC: 32.3 g/dL (ref 30.0–36.0)
MCV: 79.6 fL — ABNORMAL LOW (ref 80.0–100.0)
Platelets: 314 10*3/uL (ref 150–400)
RBC: 3.93 MIL/uL (ref 3.87–5.11)
RDW: 16 % — ABNORMAL HIGH (ref 11.5–15.5)
WBC: 12.2 10*3/uL — ABNORMAL HIGH (ref 4.0–10.5)
nRBC: 0 % (ref 0.0–0.2)

## 2022-09-13 MED ORDER — SIMETHICONE 80 MG PO CHEW: 80.0000 mg | CHEWABLE_TABLET | Freq: Four times a day (QID) | ORAL | 0 refills | Status: AC | PRN

## 2022-09-13 MED ORDER — SIMETHICONE 80 MG PO CHEW: 80.0000 mg | CHEWABLE_TABLET | Freq: Four times a day (QID) | ORAL | Status: DC | PRN

## 2022-09-13 MED ORDER — IBUPROFEN 600 MG PO TABS: 600.0000 mg | ORAL_TABLET | Freq: Four times a day (QID) | ORAL | 0 refills | Status: AC

## 2022-09-13 MED ORDER — OXYCODONE HCL 5 MG PO TABS: 5.0000 mg | ORAL_TABLET | ORAL | 0 refills | Status: AC | PRN

## 2022-09-13 NOTE — Plan of Care (Signed)
Patient to be discharged home with printed instructions. Aamani Moose L Amylynn Fano, RN  

## 2022-09-13 NOTE — Discharge Instructions (Signed)
Joni Reining,  1.  Please take the pain medication as needed for pain, do call me if your pain is not being well controlled with the pain medications.   2. You may have some shoulder and back pain because of gas in the body from the procedure, this should improve over time as you ambulate regularly.  3. I encourage you to ambulate regularly every day, for at least half an hour every day as this will help with your recovery.    4. Expect some light vaginal bleeding and soreness in vagina/lower abdomen.  The bleeding may slightly increase in 2 - 4 weeks as normal healing process continues, this is normal.  However if bleeding is excessive at any point,i.e you soaking a pad in 2 hours please give me a call.    5. Do not have any sexual intercourse or tampons or douching or baths for the next six weeks.   6.  You may take your showers as usual, but do not apply soap directly to the incisions.  After showering please pat the incisions dry with a towel.  You may see some sticky glue material over the incisions, this is glue used for the procedure and will fall off by itself.    7. Avoid heavy lifting for the next 6 weeks, i.e do not lift anything more than 30 lb for the next 6 weeks.   8. Please keep your appointment as scheduled but also someone from the office will call you to make a 1 week post op check appointment.   9.  Please avoid straining during urination or bowel movements. Take over the counter colace 100 mg twice a day for the next 6 weeks and let me know if you are struggling with constipation.   Please call my office with any questions or concerns.   I wish you a speedy recovery. Dr. Sallye Ober. [facility] Phone: [phone].   Extension: [phone].

## 2022-09-13 NOTE — Discharge Summary (Signed)
Physician Discharge Summary  Patient ID: Veronica Cooke MRN: [id number] DOB/AGE: 27/08/1996 27 y.o.  Admit date: 09/12/2022 Discharge date: 09/13/2022  Admission Diagnoses: 1. Chronic pelvic pain. 2. Clinical endometriosis.  3. Vaginal pressure symptoms. 4. Uterine prolapse. 5. Rectocele. 6. Cystocele. 7. Desire to decrease future risk of ovarian and fallopian tube cancer.   Discharge Diagnoses:  Principal Problem:   Pelvic and perineal pain Active Problems:   S/P total hysterectomy S/p Cystoscopy, anterior and posterior prolapse repair.   Discharged Condition: good  Hospital Course:  26 y/o Para 3 who presented for surgeries which were performed without complications (please see op notes for more details). By POD # 1 she was tolerating a regular diet, passing flatus, ambulating and voiding without difficulty. Her abdominal and vaginal pain were well controled. She had scant vaginal bleeding. Her vaginal packing was removed. She was deemed stable for discharge.  She was advised to take colace twice a day to keep her stool soft and avoid constipation and straining.     Consults: None  Significant Diagnostic Studies:     Latest Ref Rng & Units 09/13/2022    5:40 AM 09/06/2022   11:52 AM 05/03/2022    5:25 AM  CBC  WBC 4.0 - 10.5 K/uL 12.2  5.8  12.6   Hemoglobin 12.0 - 15.0 g/dL 10.1  12.1  8.1   Hematocrit 36.0 - 46.0 % 31.3  37.7  25.8   Platelets 150 - 400 K/uL 314  341  255        Latest Ref Rng & Units 09/13/2022    5:40 AM 11/22/2021    1:07 PM 11/02/2021    9:23 AM  BMP  Glucose 70 - 99 mg/dL 114  99  104   BUN 6 - 20 mg/dL 5  7  9    Creatinine 0.44 - 1.00 mg/dL 0.74  0.48  0.45   Sodium 135 - 145 mmol/L 136  135  136   Potassium 3.5 - 5.1 mmol/L 3.6  4.1  3.7   Chloride 98 - 111 mmol/L 105  102  105   CO2 22 - 32 mmol/L 23  22  22    Calcium 8.9 - 10.3 mg/dL 8.1  9.1  8.7      Treatments: Surgery as dictated.   Discharge Exam: Blood pressure 104/66, pulse  84, temperature 98.5 F (36.9 C), temperature source Oral, resp. rate 19, height 5\' 5"  (1.651 m), weight 81.6 kg, last menstrual period 08/13/2022, SpO2 100 %, not currently breastfeeding. General appearance: alert, cooperative, and no distress Resp: clear to auscultation bilaterally Cardio: regular rate and rhythm, S1, S2 normal, no murmur, click, rub or gallop GI: soft, non-tender; bowel sounds normal; no masses,  no organomegaly Abdominal Incisions: Clean, dry, intact, with surgiglue. Peripad; Small vaginal bleeding.   Intake/Output Summary (Last 24 hours) at 09/13/2022 2331 Last data filed at 09/13/2022 1240 Gross per 24 hour  Intake 360 ml  Output 1050 ml  Net -690 ml    I/O last 3 completed shifts: In: 2010 [P.O.:360; I.V.:1600; IV Piggyback:50] Out: [id number] [Urine:1700; Blood:150]  Disposition: Home.   Allergies as of 09/13/2022   No Known Allergies      Medication List     STOP taking these medications    famotidine 20 MG tablet Commonly known as: PEPCID   oxyCODONE-acetaminophen 5-325 MG tablet Commonly known as: PERCOCET/ROXICET   Prenatal 27-1 MG Tabs   promethazine 12.5 MG suppository Commonly known as: PHENERGAN  TAKE these medications    Acetaminophen Extra Strength 500 MG Tabs Take 1 tablet (500 mg total) by mouth every 6 (six) hours as needed for moderate pain. Do not take while taking other medications like percocet containing tylenol do not exceed 4000 grams in 24 hours   benzocaine-Menthol 20-0.5 % Aero Commonly known as: DERMOPLAST Apply 1 Application topically as needed for irritation (perineal discomfort).   clonazePAM 0.5 MG tablet Commonly known as: KLONOPIN Take 0.5 mg by mouth 3 (three) times daily as needed for anxiety.   doxylamine (Sleep) 25 MG tablet Commonly known as: UNISOM Take 25 mg by mouth at bedtime as needed for sleep.   FLUoxetine 20 MG capsule Commonly known as: PROZAC Take 20 mg by mouth daily.   ibuprofen  600 MG tablet Commonly known as: ADVIL Take 1 tablet (600 mg total) by mouth every 6 (six) hours. Start taking on: September 14, 2022 What changed:  when to take this reasons to take this Another medication with the same name was removed. Continue taking this medication, and follow the directions you see here.   oxyCODONE 5 MG immediate release tablet Commonly known as: Oxy IR/ROXICODONE Take 1-2 tablets (5-10 mg total) by mouth every 4 (four) hours as needed for moderate pain.   Senexon-S 8.6-50 MG tablet Generic drug: senna-docusate Take 2 tablets by mouth at bedtime as needed for mild constipation.   simethicone 80 MG chewable tablet Commonly known as: MYLICON Chew 1 tablet (80 mg total) by mouth 4 (four) times daily as needed for flatulence.   venlafaxine XR 37.5 MG 24 hr capsule Commonly known as: EFFEXOR-XR Take 37.5 mg by mouth daily with breakfast.        Follow-up Information     Hoover Browns, MD. Go on 10/24/2022.   Specialty: Obstetrics and Gynecology Why: Postop check Contact information: [address] [place] [address] [phone]         Hoover Browns, MD. Go in 1 week(s).   Specialty: Obstetrics and Gynecology Why: Post-op check: The office will call you to schedule this. Contact information: [address] [place] [address] [phone]                 Signed: Prescilla Sours, MD.  09/13/2022, 11:22 PM

## 2022-09-14 LAB — SURGICAL PATHOLOGY

## 2022-10-03 ENCOUNTER — Other Ambulatory Visit: Payer: Self-pay | Admitting: Obstetrics & Gynecology

## 2022-10-03 DIAGNOSIS — R32 Unspecified urinary incontinence: Secondary | ICD-10-CM

## 2022-10-04 ENCOUNTER — Ambulatory Visit
Admission: RE | Admit: 2022-10-04 | Discharge: 2022-10-04 | Disposition: A | Discharge status: Home / Self Care | Payer: BC Managed Care – PPO | Source: Ambulatory Visit | Attending: Obstetrics & Gynecology | Admitting: Obstetrics & Gynecology

## 2022-10-04 DIAGNOSIS — R32 Unspecified urinary incontinence: Secondary | ICD-10-CM

## 2022-10-04 MED ORDER — IOPAMIDOL (ISOVUE-300) INJECTION 61%
100.0000 mL | Freq: Once | INTRAVENOUS | Status: AC | PRN
  Administered 2022-10-04: 100 mL via INTRAVENOUS
